# Patient Record
Sex: Male | Born: 1940
Health system: Southern US, Community
[De-identification: ages and names within clinical notes are randomized; demographics above are authoritative.]

## PROBLEM LIST (undated history)

## (undated) DIAGNOSIS — K219 Gastro-esophageal reflux disease without esophagitis: Secondary | ICD-10-CM

## (undated) DIAGNOSIS — D692 Other nonthrombocytopenic purpura: Secondary | ICD-10-CM

## (undated) DIAGNOSIS — I10 Essential (primary) hypertension: Secondary | ICD-10-CM

## (undated) DIAGNOSIS — E039 Hypothyroidism, unspecified: Secondary | ICD-10-CM

## (undated) DIAGNOSIS — E7800 Pure hypercholesterolemia, unspecified: Secondary | ICD-10-CM

## (undated) DIAGNOSIS — D352 Benign neoplasm of pituitary gland: Secondary | ICD-10-CM

## (undated) DIAGNOSIS — J449 Chronic obstructive pulmonary disease, unspecified: Secondary | ICD-10-CM

## (undated) DIAGNOSIS — I2510 Atherosclerotic heart disease of native coronary artery without angina pectoris: Secondary | ICD-10-CM

## (undated) DIAGNOSIS — K5900 Constipation, unspecified: Secondary | ICD-10-CM

## (undated) DIAGNOSIS — M1990 Unspecified osteoarthritis, unspecified site: Secondary | ICD-10-CM

## (undated) DIAGNOSIS — Z8659 Personal history of other mental and behavioral disorders: Secondary | ICD-10-CM

## (undated) DIAGNOSIS — T7840XA Allergy, unspecified, initial encounter: Secondary | ICD-10-CM

## (undated) DIAGNOSIS — K5790 Diverticulosis of intestine, part unspecified, without perforation or abscess without bleeding: Secondary | ICD-10-CM

## (undated) DIAGNOSIS — F32A Depression, unspecified: Secondary | ICD-10-CM

## (undated) DIAGNOSIS — E079 Disorder of thyroid, unspecified: Secondary | ICD-10-CM

## (undated) DIAGNOSIS — K635 Polyp of colon: Secondary | ICD-10-CM

## (undated) DIAGNOSIS — F419 Anxiety disorder, unspecified: Secondary | ICD-10-CM

## (undated) DIAGNOSIS — C61 Malignant neoplasm of prostate: Secondary | ICD-10-CM

## (undated) HISTORY — DX: Anxiety disorder, unspecified: F41.9

## (undated) HISTORY — PX: PROSTATE SURGERY: SHX751

## (undated) HISTORY — DX: Malignant neoplasm of prostate: C61

## (undated) HISTORY — DX: Essential (primary) hypertension: I10

## (undated) HISTORY — DX: Unspecified osteoarthritis, unspecified site: M19.90

## (undated) HISTORY — DX: Constipation, unspecified: K59.00

## (undated) HISTORY — DX: Polyp of colon: K63.5

## (undated) HISTORY — DX: Allergy, unspecified, initial encounter: T78.40XA

## (undated) HISTORY — PX: POLYPECTOMY: SHX149

## (undated) HISTORY — DX: Hypothyroidism, unspecified: E03.9

## (undated) HISTORY — DX: Gastro-esophageal reflux disease without esophagitis: K21.9

## (undated) HISTORY — DX: Personal history of other mental and behavioral disorders: Z86.59

## (undated) HISTORY — DX: Pure hypercholesterolemia, unspecified: E78.00

## (undated) HISTORY — DX: Atherosclerotic heart disease of native coronary artery without angina pectoris: I25.10

## (undated) HISTORY — DX: Diverticulosis of intestine, part unspecified, without perforation or abscess without bleeding: K57.90

## (undated) HISTORY — DX: Benign neoplasm of pituitary gland: D35.2

## (undated) HISTORY — DX: Depression, unspecified: F32.A

## (undated) HISTORY — PX: UPPER GASTROINTESTINAL ENDOSCOPY: SHX188

## (undated) HISTORY — DX: Disorder of thyroid, unspecified: E07.9

## (undated) HISTORY — PX: COLONOSCOPY: SHX174

## (undated) HISTORY — DX: Chronic obstructive pulmonary disease, unspecified: J44.9

---
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
## 1989-05-10 HISTORY — PX: BACK SURGERY: SHX140

## 2003-07-03 ENCOUNTER — Encounter: Payer: Self-pay | Admitting: Internal Medicine

## 2003-07-03 DIAGNOSIS — D126 Benign neoplasm of colon, unspecified: Secondary | ICD-10-CM | POA: Insufficient documentation

## 2003-07-03 DIAGNOSIS — K5730 Diverticulosis of large intestine without perforation or abscess without bleeding: Secondary | ICD-10-CM | POA: Insufficient documentation

## 2003-08-28 ENCOUNTER — Ambulatory Visit (HOSPITAL_COMMUNITY): Admission: RE | Admit: 2003-08-28 | Discharge: 2003-08-28 | Payer: Self-pay | Admitting: Family Medicine

## 2003-09-20 ENCOUNTER — Ambulatory Visit (HOSPITAL_COMMUNITY): Admission: RE | Admit: 2003-09-20 | Discharge: 2003-09-20 | Payer: Self-pay | Admitting: Orthopedic Surgery

## 2004-04-06 ENCOUNTER — Ambulatory Visit (HOSPITAL_COMMUNITY): Admission: RE | Admit: 2004-04-06 | Discharge: 2004-04-06 | Payer: Self-pay | Admitting: Family Medicine

## 2004-05-28 ENCOUNTER — Ambulatory Visit (HOSPITAL_COMMUNITY): Admission: RE | Admit: 2004-05-28 | Discharge: 2004-05-28 | Payer: Self-pay | Admitting: Family Medicine

## 2005-06-16 ENCOUNTER — Ambulatory Visit: Payer: Self-pay | Admitting: *Deleted

## 2005-06-18 ENCOUNTER — Ambulatory Visit: Payer: Self-pay | Admitting: Cardiology

## 2005-06-18 ENCOUNTER — Ambulatory Visit (HOSPITAL_COMMUNITY): Admission: RE | Admit: 2005-06-18 | Discharge: 2005-06-18 | Payer: Self-pay | Admitting: *Deleted

## 2005-06-23 ENCOUNTER — Ambulatory Visit: Payer: Self-pay | Admitting: *Deleted

## 2007-05-20 ENCOUNTER — Emergency Department (HOSPITAL_COMMUNITY): Admission: EM | Admit: 2007-05-20 | Discharge: 2007-05-20 | Payer: Self-pay | Admitting: Emergency Medicine

## 2007-06-15 ENCOUNTER — Ambulatory Visit (HOSPITAL_COMMUNITY): Admission: RE | Admit: 2007-06-15 | Discharge: 2007-06-15 | Payer: Self-pay | Admitting: Family Medicine

## 2007-08-07 ENCOUNTER — Encounter (INDEPENDENT_AMBULATORY_CARE_PROVIDER_SITE_OTHER): Payer: Self-pay | Admitting: Surgery

## 2007-08-07 ENCOUNTER — Ambulatory Visit (HOSPITAL_COMMUNITY): Admission: RE | Admit: 2007-08-07 | Discharge: 2007-08-07 | Payer: Self-pay | Admitting: Surgery

## 2007-08-09 HISTORY — PX: CHOLECYSTECTOMY: SHX55

## 2007-10-13 ENCOUNTER — Ambulatory Visit (HOSPITAL_COMMUNITY): Admission: RE | Admit: 2007-10-13 | Discharge: 2007-10-13 | Payer: Self-pay | Admitting: Family Medicine

## 2007-11-22 ENCOUNTER — Encounter: Payer: Self-pay | Admitting: Internal Medicine

## 2007-12-08 ENCOUNTER — Ambulatory Visit: Payer: Self-pay | Admitting: Cardiology

## 2008-01-09 HISTORY — PX: OTHER SURGICAL HISTORY: SHX169

## 2008-01-25 ENCOUNTER — Encounter (INDEPENDENT_AMBULATORY_CARE_PROVIDER_SITE_OTHER): Payer: Self-pay | Admitting: Neurosurgery

## 2008-01-25 ENCOUNTER — Inpatient Hospital Stay (HOSPITAL_COMMUNITY): Admission: RE | Admit: 2008-01-25 | Discharge: 2008-01-28 | Payer: Self-pay | Admitting: Neurosurgery

## 2008-03-06 ENCOUNTER — Ambulatory Visit (HOSPITAL_COMMUNITY): Admission: RE | Admit: 2008-03-06 | Discharge: 2008-03-06 | Payer: Self-pay | Admitting: Neurosurgery

## 2008-03-10 ENCOUNTER — Inpatient Hospital Stay (HOSPITAL_COMMUNITY): Admission: EM | Admit: 2008-03-10 | Discharge: 2008-03-11 | Payer: Self-pay | Admitting: Emergency Medicine

## 2008-03-10 ENCOUNTER — Ambulatory Visit: Payer: Self-pay | Admitting: Cardiology

## 2008-06-27 DIAGNOSIS — E7800 Pure hypercholesterolemia, unspecified: Secondary | ICD-10-CM

## 2008-06-27 DIAGNOSIS — D353 Benign neoplasm of craniopharyngeal duct: Secondary | ICD-10-CM

## 2008-06-27 DIAGNOSIS — D352 Benign neoplasm of pituitary gland: Secondary | ICD-10-CM

## 2008-06-27 DIAGNOSIS — K219 Gastro-esophageal reflux disease without esophagitis: Secondary | ICD-10-CM

## 2008-06-27 DIAGNOSIS — I10 Essential (primary) hypertension: Secondary | ICD-10-CM

## 2008-07-01 ENCOUNTER — Ambulatory Visit: Payer: Self-pay | Admitting: Internal Medicine

## 2008-07-01 DIAGNOSIS — K5900 Constipation, unspecified: Secondary | ICD-10-CM | POA: Insufficient documentation

## 2008-07-01 DIAGNOSIS — Z860100 Personal history of colon polyps, unspecified: Secondary | ICD-10-CM | POA: Insufficient documentation

## 2008-07-01 DIAGNOSIS — K625 Hemorrhage of anus and rectum: Secondary | ICD-10-CM | POA: Insufficient documentation

## 2008-07-10 ENCOUNTER — Ambulatory Visit: Payer: Self-pay | Admitting: Internal Medicine

## 2008-12-11 ENCOUNTER — Ambulatory Visit (HOSPITAL_COMMUNITY): Admission: RE | Admit: 2008-12-11 | Discharge: 2008-12-11 | Payer: Self-pay | Admitting: Neurosurgery

## 2009-01-30 ENCOUNTER — Ambulatory Visit: Admission: RE | Admit: 2009-01-30 | Discharge: 2009-03-22 | Payer: Self-pay | Admitting: Radiation Oncology

## 2009-02-03 LAB — PSA: PSA: 6.76 ng/mL — ABNORMAL HIGH (ref 0.10–4.00)

## 2009-04-18 ENCOUNTER — Telehealth (INDEPENDENT_AMBULATORY_CARE_PROVIDER_SITE_OTHER): Payer: Self-pay | Admitting: *Deleted

## 2009-04-21 ENCOUNTER — Inpatient Hospital Stay (HOSPITAL_COMMUNITY): Admission: RE | Admit: 2009-04-21 | Discharge: 2009-04-23 | Payer: Self-pay | Admitting: Urology

## 2009-04-21 ENCOUNTER — Encounter (INDEPENDENT_AMBULATORY_CARE_PROVIDER_SITE_OTHER): Payer: Self-pay | Admitting: Urology

## 2009-12-08 ENCOUNTER — Ambulatory Visit (HOSPITAL_COMMUNITY): Admission: RE | Admit: 2009-12-08 | Discharge: 2009-12-08 | Payer: Self-pay | Admitting: Family Medicine

## 2010-05-31 ENCOUNTER — Encounter: Payer: Self-pay | Admitting: Family Medicine

## 2010-08-11 LAB — TYPE AND SCREEN
ABO/RH(D): A POS
Antibody Screen: NEGATIVE

## 2010-08-11 LAB — HEMOGLOBIN AND HEMATOCRIT, BLOOD
HCT: 37.7 % — ABNORMAL LOW (ref 39.0–52.0)
HCT: 38.6 % — ABNORMAL LOW (ref 39.0–52.0)
Hemoglobin: 12.7 g/dL — ABNORMAL LOW (ref 13.0–17.0)
Hemoglobin: 13 g/dL (ref 13.0–17.0)

## 2010-08-11 LAB — COMPREHENSIVE METABOLIC PANEL
ALT: 21 U/L (ref 0–53)
AST: 20 U/L (ref 0–37)
Albumin: 3.9 g/dL (ref 3.5–5.2)
Alkaline Phosphatase: 76 U/L (ref 39–117)
BUN: 9 mg/dL (ref 6–23)
CO2: 30 mEq/L (ref 19–32)
Calcium: 9.6 mg/dL (ref 8.4–10.5)
Chloride: 105 mEq/L (ref 96–112)
Creatinine, Ser: 1.05 mg/dL (ref 0.4–1.5)
GFR calc Af Amer: >60 mL/min (ref >60)
GFR calc non Af Amer: >60 mL/min (ref >60)
Glucose, Bld: 95 mg/dL (ref 70–99)
Potassium: 4.8 mEq/L (ref 3.5–5.1)
Sodium: 142 mEq/L (ref 135–145)
Total Bilirubin: 0.7 mg/dL (ref 0.3–1.2)
Total Protein: 7.5 g/dL (ref 6.0–8.3)

## 2010-08-11 LAB — CBC
HCT: 45.4 % (ref 39.0–52.0)
Hemoglobin: 15.3 g/dL (ref 13.0–17.0)
MCHC: 33.6 g/dL (ref 30.0–36.0)
MCV: 93.5 fL (ref 78.0–100.0)
Platelets: 195 10*3/uL (ref 150–400)
RBC: 4.86 MIL/uL (ref 4.22–5.81)
RDW: 12.6 % (ref 11.5–15.5)
WBC: 7.4 10*3/uL (ref 4.0–10.5)

## 2010-08-11 LAB — BASIC METABOLIC PANEL
BUN: 11 mg/dL (ref 6–23)
CO2: 25 mEq/L (ref 19–32)
Chloride: 100 mEq/L (ref 96–112)
Creatinine, Ser: 1.19 mg/dL (ref 0.4–1.5)
Potassium: 4.4 mEq/L (ref 3.5–5.1)

## 2010-08-11 LAB — ABO/RH: ABO/RH(D): A POS

## 2010-08-15 LAB — CREATININE, SERUM
Creatinine, Ser: 1.06 mg/dL (ref 0.4–1.5)
GFR calc Af Amer: >60 mL/min (ref >60)

## 2010-09-22 NOTE — Consult Note (Signed)
[name], [name] NO.:  [id number]   MEDICAL RECORD NO.:  [id number]          PATIENT TYPE:  INP   LOCATION:  A311                          FACILITY:  [facility]   PHYSICIAN:  Gerrit Friends. Dietrich Pates, MD, FACCDATE OF BIRTH:  [birth year]   DATE OF CONSULTATION:  DATE OF DISCHARGE:  03/11/2008                                 CONSULTATION   REFERRING PHYSICIAN:  Scott A. Gerda Diss, MD   HISTORY OF PRESENT ILLNESS:  A 70 year old gentleman with a recent  transsphenoidal resection of a pituitary adenoma now presents with chest  discomfort.  Mr. Loera has no history of cardiovascular disease, but he  does have a number of risk factors including hypertension and  hyperlipidemia.  He smokes cigarettes when he was much younger, but  discontinued this when he was 32 with a total consumption of less than  20-pack-years.  He was evaluated by Dr. Shirlee Latch of [facility] in  July, prior to his planned neurosurgery.  He was not found to have any  symptoms of cardiovascular disease at that time.  No further testing was  advised.  He did well, from a physiologic standpoint, with that surgical  procedure well as cholecystectomy earlier this year.   He has had problems since his pituitary surgery with malaise, exercise  intolerance, and dyspnea.  The latter is not true air hunger, but a  sense of difficulty taking a breath.  This is not necessarily related to  exertion.  She was evaluated by an endocrinologist, Dr. Lurene Shadow, a week  or two ago and told that he has no endocrinologic abnormalities.  He was  relaxing at home when he noted the onset of moderate lower mid  substernal aching discomfort without associated nausea or dyspnea.  He  did have diaphoresis.  He has had some symptoms of GERD, which were  similar, but without the diaphoresis.  He reported this to a family  member who summoned EMS.  He was subsequently seen at the emergency  department and admitted.  By the time he  arrived, his symptoms had  resolved.   Past medical history is, otherwise, notable for diverticular disease and  BPH.  He underwent laparoscopic cholecystectomy in April of this year  and remote lumbosacral spine surgery.   He has no known drug allergies.  He has had adverse effects to  narcotics, specifically CODEINE and MORPHINE.   CURRENT MEDICATIONS:  1. Lisinopril 5 mg daily.  2. Pravastatin 40 mg daily.  3. Metoprolol - dose uncertain.  4. He has taken omeprazole in the past.   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  Drinks  2-4 ounces of alcohol a day.  Lives with his wife.   FAMILY HISTORY:  Father had coronary artery disease and a myocardial  infarction at age 70.   REVIEW OF SYSTEMS:  Mild intermittent dizziness; some arthritic  discomfort; hemorrhoids; follows a low-fat diet at home.  Experiences  some nocturia and other urinary symptoms of BPH.  Requires  corrective  lenses for near and far vision.  Has some lower dentures.  All other  systems reviewed and are negative.   PHYSICAL EXAMINATION:  GENERAL:  Pleasant, well-nourished gentleman in  no acute distress.  VITAL SIGNS:  The weight is 97.5 kg, height is 67 inches, temperature  98.6, heart rate 95 and regular, respirations 20, blood pressure 140/80.  O2 sat 95% on room air.  HEENT:  EOMs full; slightly injected conjunctivae.  Normal oral mucosa.  NECK:  No jugular venous distention; normal carotid upstrokes without  bruits.  LUNGS:  Clear.  CARDIAC:  Normal first and second heart sounds.  Minimal systolic  ejection murmur.  ABDOMEN:  Soft and nontender; no organomegaly; normal bowel sounds  without bruits.  EXTREMITIES:  No edema; normal distal pulses.  NEUROLOGIC:  Symmetric strength and tone; normal cranial nerves.   Laboratory is unremarkable with a normal chemistry profile except for a  glucose of 139,  normal CBC, slightly elevated D-dimer of 0.7, normal  cardiac markers, and a normal urinalysis.  CT scan of the chest showed  no significant lung abnormalities, but there were vascular calcification  suggestive of atherosclerosis in the aorta and coronary arteries.  EKGs  are normal except for the presence of nondiagnostic inferior Q-waves.   IMPRESSION:  Mr. [name] presents with a single episode of chest  discomfort that may well have been caused by his known gastroesophageal  reflux disease.  His exam, cardiac markers, and EKGs are benign, but CT  scan of the chest indicates the presence of coronary atherosclerosis.  I  would certainly proceed with a stress test, but this can be done on an  outpatient basis.  He probably should resume omeprazole.  The etiology  of his nonspecific weakness and malaise as well as his respiratory  symptoms is not immediately obvious.  He should be encouraged to be as  active as possible.  If symptoms persist, additional testing may be  warranted.   We greatly appreciate the request for consultation and will be happy to  continue to provide Cardiology Care to this nice gentleman.      Gerrit Friends. Dietrich Pates, MD, [facility]  Electronically Signed     RMR/MEDQ  D:  03/11/2008  T:  03/12/2008  Job:  [id number]

## 2010-09-22 NOTE — H&P (Signed)
NAMEBLESS, BELSHE NO.:  [id number]   MEDICAL RECORD NO.:  [id number]          PATIENT TYPE:  INP   LOCATION:  A311                          FACILITY:  [facility]   PHYSICIAN:  Dorris Singh, DO    DATE OF BIRTH:  [birth year]   DATE OF ADMISSION:  03/10/2008  DATE OF DISCHARGE:  LH                              HISTORY & PHYSICAL   The patient is a 70 year old Caucasian male who presented to [facility]  emergency room with an insidious report of weakness that had been going  on for the last several days.  He is a patient of Dr. Gerda Diss. Just  recently on September 17 he had the removal of a pituitary tumor, and  since then he has noticed since several weeks that he has had weakness  with minimal exertion.  He is seeing Dr. Talmage Nap in [place], and has  had labs which are normal.  Currently he has been drinking lots of  water, urinating frequently.  He is not a diabetic, but has been  concerned about this as well and is also complaining of some extremity  numbness and tingling.  The patient stated that tonight he experienced  some chest discomfort which was located in the mid epigastric area.  It  was also associated with shortness of breath, and he has not been  sleeping for several days.  He just taking Ambien over the weekend.  He  took it on Friday, did well.  Started taking it again on Saturday; one 5  mg dose did not help him.  He had repeated the dose and ended up having  an amnesic event where he woke up and found urine all over the floor.  At this point in time with this weakness, he decided to come and be  evaluated.   PAST MEDICAL HISTORY:  Significant for:   1. Hypertension.  2. Diverticulosis.  3. Prostatic hypertrophy.  4. A pituitary tumor.   SURGICAL HISTORY:  Significant for back surgery and a pituitary tumor  removal January 25, 2008.   SOCIAL HISTORY:  He is a nonsmoker, but he is a former smoker and heavy  drinker.  He lives alone and has  pets.   ALLERGIES:  He has allergies to CODEINE which causes nausea and vomiting  and to MORPHINE which caused chest pain.   Currently he is on:   1. Lisinopril 5 mg once a day.  2. Pravachol no dose.  3. Metoprolol tartrate no dose.  4. Benadryl oral no dose.  5. Dexamethasone after his surgery.   REVIEW OF SYSTEMS:  CONSTITUTIONAL:  Positive for fatigue, negative for  fever and chills.  EYES:  Negative for eye pain.  EARS, NOSE, MOUTH, AND  THROAT:  Negative for ear pain, changes in smell, changes in hearing.  CARDIOVASCULAR:  Positive for chest pain.  RESPIRATORY:  Negative for  cough, dyspnea or wheezing.  GASTROINTESTINAL:  Negative for nausea,  vomiting or diarrhea, abdominal pain.  GU:  Positive for polyuria.  MUSCULOSKELETAL:  Negative for arthralgias.  Positive for back pain.  SKIN:  Negative for rash.  NEUROLOGICAL:  Positive for weakness.  Negative for headache.  PSYCHIATRIC:  Negative for depression.  HEMATOLOGIC:  Negative for anemia.   VITALS:  Temperature 98.6, pulse 93, respirations 20, blood pressure  139/79.  GENERAL:  The patient is a 70 year old Caucasian male who is well-  developed, well-nourished in no acute distress.  HEAD:  Normocephalic, atraumatic.  EYES:  PERRLA, EOMI.  No scleral icterus or conjunctival injection.  MOUTH:  Teeth are in good repair.  There is no erythema or exudate.  NECK:  Supple.  No lymphadenopathy.  HEART:  Regular rate and rhythm.  LUNGS:  Clear to auscultation bilaterally.  No rales, wheezes or  rhonchi.  ABDOMEN:  Soft, nontender.  EXTREMITIES:  Positive pulses.   LABORATORIES FOR TODAY:  135 for sodium, potassium 3.9, chloride 101,  CO2 27, glucose 115, BUN 15, creatinine 1.12.  His urine is negative.   ASSESSMENT/PLAN:  1. Tachycardia.  2. Weakness.  3. History of pituitary tumor.   PLAN:  To admit patient to the service of [facility] and then to transfer  his care over to Dr. Gerda Diss.   1. Will admit him to a  telemetry bed.  Will also give him IV fluid      boluses.  2. Keep him on IV fluids.  3. Will do DVT and GI prophylaxis, and place the patient on home      medication.  4. Will consult [facility] as well as obtaining a 2-D echo.      Will continue to monitor and make changes as necessary.      Dorris Singh, DO  Electronically Signed     CB/MEDQ  D:  03/11/2008  T:  03/11/2008  Job:  [id number]   cc:   Gerda Diss, M.D.

## 2010-09-22 NOTE — Assessment & Plan Note (Signed)
[facility]                            CARDIOLOGY OFFICE NOTE   [name], [name]                         MRN:          [id number]  DATE:12/08/2007                            DOB:          Nov 03, 1940    REFERRING PHYSICIAN:  Cristi Loron, M.D.   HISTORY OF PRESENT ILLNESS:  This is a 70 year old man with a history of  hypertension, hypercholesterolemia, presents to Cardiology Clinic for  evaluation prior to pituitary surgery.  The patient has had a history of  last few months of headaches and increasing fatigue.  He did have an  evaluation by Neurology who was found to have a pituitary macroadenoma,  which appears to be growth hormone secreting.  He does need surgery for  this and was referred for cardiac evaluation prior to surgery.  In  baseline since the diagnosis of this tumor was made, the patient states  that he has been more fatigued.  However, he does have good exercise  tolerance.  He is able to climb several flights of steps without  trouble.  He is very active.  He does his yard work and recently cleaned  his gutters and has been working on a [employer], so he does do  fairly strenuous activities without problems.  He denies dyspnea on  exertion.  He denies chest pain.  He denies orthopnea.  His only  complaint has been some recent fatigue that is associated also with  recent onset of headaches as well as some trouble with his balance  lately.  Also of note, the patient recently went through a laparoscopic  cholecystectomy in April 2009, with no problems.   PAST MEDICAL HISTORY:  1. Hypertension.  2. Hypercholesterolemia.  3. Gastroesophageal reflux disease.  4. Pituitary macroadenoma that is growth hormone secreting.  5. Status post laparoscopic cholecystectomy in April 2009.   MEDICATIONS:  1. Lisinopril 5 mg daily.  2. Pravastatin 40 mg daily.  3. Omeprazole 20 mg daily.   SOCIAL HISTORY:  The patient is retired.  He  does do some farming.  He  is a passive smoker.  He quit in 1985.  He drinks 2-4 alcohol beverages  a day.  Lives with his wife.   FAMILY HISTORY:  He had a father who had a myocardial infarction at the  age of 13.   REVIEW OF SYSTEMS:  Negative except as noted in the history of present  illness.   STUDIES:  EKG is reviewed today showing normal sinus rhythm.  No ST or T-  wave changes, hence a normal EKG.  In February 2007, echocardiogram  showed mild LVH, normal LV function, no wall motion abnormalities,  normal RV size and function, and normal valve function.  Carotid  ultrasound in February 2007 showed no significant stenosis.   LABORATORY DATA:  Labs in March 2009, showed a creatinine of 1.0.   PHYSICAL EXAMINATION:  VITAL SIGNS:  Blood pressure 131/70, heart rate  64, and weight 151.  Heart rate is regular.  GENERAL:  In no apparent distress, well-developed male.  NEUROLOGIC:  Alert  and oriented x3, normal affect.  NECK:  No JVD.  No carotid bruits.  LUNGS:  Clear to auscultation bilaterally.  HEART:  Regular S1 and S2.  There is no S3.  No S4.  No murmur.  ABDOMEN:  Soft and nontender.  No hepatosplenomegaly.  EXTREMITIES:  There is no clubbing, cyanosis, or edema.   ASSESSMENT AND PLAN:  This is a 70 year old with history of  hypertension, hyperlipidemia, presents to Cardiology Clinic for  evaluation prior to pituitary surgery.  1. Preoperative pituitary surgery.  The patient has good exercise      tolerance.  He has no symptoms that are referable to ischemia.  He      does have some risk factors for coronary artery disease including      hypertension, hypercholesterolemia, and his age.  There are no      findings that would grab [place] to do a preoperative stress test at      this point.  Also of note, he did safely go through a laparoscopic      cholecystectomy in April.  I think at this time, the patient needs      no further testing prior to his surgery.  He should  continue on his      statin.  We will add just baseline primary prevention, an 81 mg a      day aspirin, which will need to be stopped prior to the surgery      when that is scheduled, and also we will have him start on Toprol-      XL 25 mg a day as he will need to be on a perioperative beta-      blocker.  2. Hypertension.  The patient's blood pressure is controlled today at      131/70.  We are starting Toprol-XL today as a perioperative beta-      blockade.   This patient was seen under preceptorship of Dr. Everardo Beals. Juanda Chance, MD,  [facility].     Marca Ancona, MD  Electronically Signed    DM/MedQ  DD: 12/08/2007  DT: 12/09/2007  Job #: [id number]   cc:   Cristi Loron, M.D.  Dorisann Frames, M.D.  Bruce Elvera Lennox Juanda Chance, MD, [facility]

## 2010-09-22 NOTE — Discharge Summary (Signed)
NAMEJACQUEL, MCCAMISH NO.:  [id number]   MEDICAL RECORD NO.:  [id number]          PATIENT TYPE:  INP   LOCATION:  3002                         FACILITY:  [facility]   PHYSICIAN:  Cristi Loron, M.D.DATE OF BIRTH:  Feb 12, 1941   DATE OF ADMISSION:  01/25/2008  DATE OF DISCHARGE:  01/28/2008                               DISCHARGE SUMMARY   BRIEF HISTORY:  The patient is a 70 year old white male who was found to  have a pituitary tumor.  This was followed radiographically and has been  found that the tumor was enlarging.  We discussed the various treatment  options with the patient including surgery.  He has to weigh the risks,  benefits, and alternatives surgeries, I proceed with a transsphenoidal  resection of this tumor.  For further details of this admission, please  refer typed history and physical.   HOSPITAL COURSE:  The patient was admitted to [facility] on January 25, 2008.  On the day of admission, he underwent transsphenoidal  resection of pituitary tumor using microdissection.  Dr. Dillard Cannon did the exposure and I did the evacuation of the tumor.  The  surgery went well (for full details of this operation, please refer to  typed operative notes).   POSTOPERATIVE COURSE:  The patient's postoperative course is essentially  unremarkable.  He was monitored in the ICU for signs of diabetes  insipidus.  This did not occur and by postop day #3, i.e., January 28, 2008, the patient was afebrile.  Vitals were stable.  Hislungs looked  good and he was good and he was discharged to home.  Dr. Ezzard Standing removed  this nasal packing.  The patient was discharged home on January 28, 2008.   DISCHARGE INSTRUCTIONS:  The patient was given written discharge  instructions.  He was instructed to follow up with me in approximately 4  weeks.  Follow up with Dr. Ezzard Standing per his instructions and told to  follow up with Dr. Lurene Shadow (Endocrinologist) in about 4  weeks.   DISCHARGE MEDICATIONS:  Lortab 10 #50 one p.o. q.4 h. p.r.n. for pain  and a Decadron taper.   FINAL DIAGNOSIS:  Pituitary microadenoma.   PROCEDURE PERFORMED:  Transsphenoidal resection of pituitary  microadenoma using microdissection.      Cristi Loron, M.D.  Electronically Signed     JDJ/MEDQ  D:  03/11/2008  T:  03/12/2008  Job:  [id number]

## 2010-09-22 NOTE — Op Note (Signed)
[name], [name] NO.:  [id number]   MEDICAL RECORD NO.:  [id number]          PATIENT TYPE:  INP   LOCATION:  3109                         FACILITY:  [facility]   PHYSICIAN:  Kristine Garbe. Ezzard Standing, M.D.DATE OF BIRTH:  Feb 22, 1941   DATE OF PROCEDURE:  DATE OF DISCHARGE:                               OPERATIVE REPORT   PREOPERATIVE DIAGNOSIS:  Pituitary macroadenoma.   POSTOPERATIVE DIAGNOSIS:  Pituitary macroadenoma.   OPERATION:  Septal transsphenoidal resection of pituitary adenoma.   SURGEON:  Kristine Garbe. Ezzard Standing, MD   CO-SURGEON:  Cristi Loron, MD   ANESTHESIA:  General endotracheal.   COMPLICATIONS:  None.   BRIEF CLINICAL NOTE:  [name] is a 70 year old gentleman who had a  blackout spell and had a workup with the CT scan and noted to have a  pituitary tumor.  He is taken to the operating room at this time by Dr.  Lovell Sheehan for resection of pituitary tumor.  I am involved as a co-surgeon  to provide septal transsphenoidal access to pituitary macroadenoma.   DESCRIPTION OF PROCEDURE:  The patient was positioned in the operating  room by Dr. Lovell Sheehan.  The nose was then prepped with Betadine solution  and draped out with sterile towels.  Nose was then further prepped with  cotton pledgets soaked in Afrin and the septum.  The nose was injected  with 8 mL of Xylocaine with epinephrine for local anesthetic as well as  hemostasis.  An extended hemitransfixion incision was made along the  caudal edge of the septum on the right side.  Mucoperiosteal and  mucoperichondrial flaps were elevated posteriorly on the right side.  The cartilaginous septum was then transected vertically approximately 2  cm posteriorly and the anterior portion of the cartilaginous septum was  dissected off of the maxillary crest and displaced over into the left  airway.  Mucoperiosteal flaps were elevated on either side of the  posterior septum back to the space of the sphenoid  sinus.  The Hardy  retractor was then positioned.  A sphenoidotomy was performed and the  sphenoid sinus was entered.  The patient had a septal division of the  sphenoid sinus that anchored anteriorly from left to right.  The septum  where the sphenoid sinus was taken down with rongeurs and the bulge of  the sella was identified.  After enlarging the sphenoidotomy, Dr.  Lovell Sheehan was then called back and we proceeded to resect the pituitary  adenoma.  Following completion of resection, there was no significant  CSF leak and no packing was placed within the sphenoid sinus.  Septum  was brought back to midline by myself and the closure was performed by  myself.  The hemitransfixion incision was closed with interrupted 4-0  chromic suture.  The septum was basted with a 3-0 chromic suture.  Septum originally had been deviated to the left side, after  repositioning the septum in midline and closed with chromic sutures.  Merocel packs were placed on either inside of the septum and soaked in  bacitracin ointment.  This completed the procedure.  [name] was  awakened from anesthesia and transferred to the recovery room, postop  doing well.   DISPOSITION:  [name] will be admitted to the NICU.  We will plan on  removing his nasal packs in 3 days and have him follow up in my office  in 2 weeks for recheck.           ______________________________  Kristine Garbe. Ezzard Standing, M.D.     CEN/MEDQ  D:  01/25/2008  T:  01/25/2008  Job:  [id number]   cc:   Lorin Picket A. Gerda Diss, MD  Cristi Loron, M.D.

## 2010-09-22 NOTE — Op Note (Signed)
NAMETRITON, HEIDRICH NO.:  [id number]   MEDICAL RECORD NO.:  [id number]          PATIENT TYPE:  INP   LOCATION:  3109                         FACILITY:  [facility]   PHYSICIAN:  Cristi Loron, M.D.DATE OF BIRTH:  02/24/1941   DATE OF PROCEDURE:  01/25/2008  DATE OF DISCHARGE:                               OPERATIVE REPORT   BRIEF HISTORY:  This patient is a 70 year old white male who was found  to having a pituitary tumor, this was followed radiographically and has  been found that the tumor is enlarging.  We discussed various treatment  options with the patient including surgery.  He has weighed the risks,  benefits, and alternatives of surgery, and decided to proceed with a  transsphenoidal resection of this tumor.   PREOPERATIVE DIAGNOSIS:  Pituitary tumor/microadenoma.   POSTOPERATIVE DIAGNOSIS:  Pituitary tumor/microadenoma.   PROCEDURE:  Transsphenoidal resection of pituitary tumor using  microdissection.   SURGEONS:  1. Cristi Loron, MD  2. Kristine Garbe. Ezzard Standing, MD   ANESTHESIA:  General endotracheal.   ESTIMATED BLOOD LOSS:  25 mL.   SPECIMENS:  Tumor.   DRAINS:  None.   COMPLICATIONS:  None.   DESCRIPTION OF PROCEDURE:  The patient was brought to the operating room  by an anesthesia team.  General endotracheal anesthesia was induced.  The patient's  calvarium was supported and Mayfield three-point headrest  positioned by both Dr. Ezzard Standing and me.  At this point, Dr. Narda Bonds  provided the exposure surgery.  For his portion of this operation,  please refer today's typed operative note.   After Ezzard Standing had provided exposure, I took over the operation.  Dr.  Ezzard Standing had exposed the sphenoidal sinus and the sella within.  There was  a very thin rim of cortical bone over the sella, this was easily removed  using the pituitary forceps and the Penfield #4.  Then, under the  magnification and illumination of the microscope, incised the  dura over  the sella using the #15 and #11 blade scalpel.  I then used pituitary  forceps and the various ring curettes to carefully remove this pituitary  tumor.  We sent off specimens to the pathologist.  After resection of  the tumor, I obtained hemostasis by using thrombin-soaked Gelfoam with  it to sit in the sella for few minutes and then removed it.  There was  actually no bleeding in the sella.  At this point, we irrigated the  wound out with Bacitracin solution.  I think, we got a gross-total  resection of this tumor.  We left one small piece of Gelfoam in the  sella.  We did not violate the diaphragma sellae and there was no  evidence of CSF leak.  At this point, Dr. Ezzard Standing took over the closure  of the operation.  For further details, please refer his operative note.   At the end of this case, all sponge, instruments, and needle counts were  correct.  The patient was subsequently extubated by anesthesia team and  transported to the postanesthesia care unit in stable  condition.      Cristi Loron, M.D.  Electronically Signed     JDJ/MEDQ  D:  01/25/2008  T:  01/26/2008  Job:  [id number]

## 2010-09-22 NOTE — Assessment & Plan Note (Signed)
[facility]                            CARDIOLOGY OFFICE NOTE   [name], [name]                         MRN:          [id number]  DATE:12/08/2007                            DOB:          06-Jun-1940    ADDENDUM   I would like to add that this note needs to be cc'd to Dorisann Frames,  M.D. and Dr. Cristi Loron, MD, and also, of note, this patient was  seen under preceptorship of Dr.Bruce R. Juanda Chance, MD, [facility].     Marca Ancona, MD     DM/MedQ  DD: 12/08/2007  DT: 12/09/2007  Job #: [id number]

## 2010-09-25 NOTE — Procedures (Signed)
NAMEHYDER, DEMAN NO.:  [id number]   MEDICAL RECORD NO.:  [id number]          PATIENT TYPE:  OUT   LOCATION:  RAD                           FACILITY:  [facility]   PHYSICIAN:  [place] Bing, M.D. [facility] OF BIRTH:  1940-11-16   DATE OF PROCEDURE:  06/18/2005  DATE OF DISCHARGE:                                  ECHOCARDIOGRAM   REFERRING:  Drs. Luk[place] and [place]   CLINICAL DATA:  A 70 year old gentleman with hypertension and syncope.   M-MODE:  Aorta 2.6, left atrium 3.8, septum 1.3, posterior wall 1.1, LV  diastole 3.4, LV systole 2.1.   CONCLUSIONS:  1.  Technically adequate echocardiographic study.  2.  Normal left and right atrial size; normal right ventricular size and      function; very mild RVH.  3.  Normal aortic, mitral, tricuspid and pulmonic valves.  4.  Normal Doppler study with trivial tricuspid regurgitation.  5.  Normal IVC.  6.  Normal left ventricular size; borderline hypertrophy with some      disproportionate thickening of the septum. Normal regional and global LV      systolic function.      [place] Bing, M.D. [facility]  Electronically Signed     RR/MEDQ  D:  06/19/2005  T:  06/20/2005  Job:  [id number]

## 2010-09-25 NOTE — Op Note (Signed)
NAMEDANIAL, HLAVAC NO.:  [id number]   MEDICAL RECORD NO.:  [id number]          PATIENT TYPE:  AMB   LOCATION:  DAY                          FACILITY:  [facility]   PHYSICIAN:  Currie Paris, M.D.DATE OF BIRTH:  [birth year]   DATE OF PROCEDURE:  DATE OF DISCHARGE:  08/07/2007                               OPERATIVE REPORT   OFFICE MEDICAL RECORD NUMBER:  [id number]   PREOPERATIVE DIAGNOSIS:  Chronic calculus cholecystitis.   POSTOPERATIVE DIAGNOSIS:  Chronic calculus cholecystitis.   PROCEDURE:  Laparoscopic cholecystectomy with operative cholangiogram.   SURGEON:  Currie Paris, M.D.   ASSISTANT:  Anselm Pancoast. Zachery Dakins, M.D.   ANESTHESIA:  General endotracheal.   CLINICAL HISTORY:  This is a 70 year old gentleman with known gallstones  who wished to have his gallbladder removed .   DESCRIPTION OF PROCEDURE:  The patient was seen in the holding area and  had no further questions.  We confirmed cholecystectomy as the planned  procedure.   The patient was taken to the operating room and after satisfactory  general endotracheal anesthesia had been obtained the abdomen was  prepped and draped.  Time-out was done.   Quarter percent plain Marcaine was used for each incision.  The  umbilical incision was made, the fascia opened and the peritoneal cavity  entered.  A pursestring was placed, the Hasson introduced and the  abdomen insufflated to 15.   Under direct vision a 10/11 trocar was placed in the epigastrium and two  5's laterally.  The gallbladder was visualized.  It was retracted over  the liver.  A few adhesions were taken down.  The peritoneum over the  triangle of Calot was opened and the cystic artery and cystic duct  identified.   A cystic clip was placed on the cystic duct and catheterized with a Cook  catheter and operative cholangiography done which was normal.   The catheter was removed and 2 clips placed on the stay side of the  duct.  It was divided.  The artery was clipped and divided.  Smaller  branches were clipped and the gallbladder removed from below to above.  This was placed in a bag and brought out the umbilical port.   We irrigated and made sure everything was dry.  The lateral ports were  removed under direct vision.  There was no bleeding.  The umbilical site  was closed with a pursestring.  The abdomen was deflated through the  epigastric port.  The skin was closed with 4-0 Monocryl subcuticular and  Dermabond.   The patient tolerated the procedure well and there were no  complications.  All counts were correct.   Please note this is a redictation of the note done the day of surgery  and hence is done to the best of my memory.      Currie Paris, M.D.  Electronically Signed     CJS/MEDQ  D:  08/22/2007  T:  08/22/2007  Job:  [id number]   cc:   Lorin Picket A. Gerda Diss, MD  Fax: [phone]

## 2010-09-25 NOTE — Discharge Summary (Signed)
NAMEAMARIE, TARTE                  ACCOUNT NO.:  [id number]   MEDICAL RECORD NO.:  [id number]          PATIENT TYPE:  INP   LOCATION:  A311                          FACILITY:  [facility]   PHYSICIAN:  Scott A. Gerda Diss, MD    DATE OF BIRTH:  1940/11/10   DATE OF ADMISSION:  03/10/2008  DATE OF DISCHARGE:  11/02/2009LH                               DISCHARGE SUMMARY   DIAGNOSES:  1. Weakness.  2. Excessive thirst and urination.  3. Also some tachycardia that he experienced before coming in to the      hospital.   Recently he had had a pituitary tumor removed.  His enzymes ruled out.  Cardiology consult __________ did not feel that he was having any  significant problem other than possibly reflux, and they were going to  set him up as an outpatient for a stress test.  He was feeling better  after receiving fluids and was felt stable for discharge, and discharged  to home.  Cardiac exam was normal at the time of discharge as well as  neurologic exam.  It was most likely could be developing diabetes  insipidus secondary to his pituitary tumor.  Will refer him back to his  endocrinologist.  He is to do lisinopril 5 mg daily, Pravachol daily,  omeprazole 20 mg daily, and he is recommended to take no Ambien because  it caused amnesia and difficulty during the middle of the night plus  also Xanax on a p.r.n. basis and get the stress test done by cardiology.  Call us if any problems.  Follow up within a week.      Scott A. Gerda Diss, MD  Electronically Signed     SAL/MEDQ  D:  04/01/2008  T:  04/01/2008  Job:  [id number]

## 2011-02-01 LAB — COMPREHENSIVE METABOLIC PANEL
ALT: 29
BUN: 11
Calcium: 9.8
Glucose, Bld: 110 — ABNORMAL HIGH
Sodium: 138
Total Protein: 8.2

## 2011-02-01 LAB — URINALYSIS, ROUTINE W REFLEX MICROSCOPIC
Glucose, UA: NEGATIVE
Specific Gravity, Urine: 1.021
pH: 7

## 2011-02-01 LAB — DIFFERENTIAL
Lymphs Abs: 1.7
Monocytes Relative: 8
Neutro Abs: 4.9
Neutrophils Relative %: 67

## 2011-02-01 LAB — CBC
Hemoglobin: 15.2
MCHC: 33.5
RBC: 4.92
RDW: 12.5
WBC: 7.3

## 2011-02-08 LAB — BASIC METABOLIC PANEL
CO2: 26
Calcium: 8.9
Chloride: 107
Chloride: 108
GFR calc Af Amer: >60
GFR calc Af Amer: >60
GFR calc Af Amer: >60
GFR calc non Af Amer: >60
Glucose, Bld: 128 — ABNORMAL HIGH
Potassium: 4.1
Potassium: 4.2
Sodium: 143
Sodium: 143

## 2011-02-09 LAB — URINALYSIS, ROUTINE W REFLEX MICROSCOPIC
Bilirubin Urine: NEGATIVE
Nitrite: NEGATIVE
Specific Gravity, Urine: <1.005 — ABNORMAL LOW
pH: 6

## 2011-02-09 LAB — BASIC METABOLIC PANEL
BUN: 11
CO2: 24
Calcium: 8.8
Calcium: 8.8
Chloride: 107
Creatinine, Ser: 0.92
GFR calc Af Amer: >60
GFR calc non Af Amer: >60
Glucose, Bld: 115 — ABNORMAL HIGH
Glucose, Bld: 139 — ABNORMAL HIGH
Potassium: 3.9
Sodium: 135

## 2011-02-09 LAB — DIFFERENTIAL
Eosinophils Absolute: 0.1
Eosinophils Relative: 1
Lymphocytes Relative: 23
Lymphs Abs: 1.8
Lymphs Abs: 1.9
Monocytes Absolute: 0.9
Monocytes Absolute: 0.9
Monocytes Relative: 12
Monocytes Relative: 13 — ABNORMAL HIGH
Neutro Abs: 5.2
Neutrophils Relative %: 59

## 2011-02-09 LAB — CK TOTAL AND CKMB (NOT AT ARMC)
CK, MB: 1.7
Relative Index: 1.6

## 2011-02-09 LAB — CBC
HCT: 38 — ABNORMAL LOW
HCT: 39.5
Hemoglobin: 13
Hemoglobin: 13.7
MCV: 93
RBC: 4.09 — ABNORMAL LOW
RBC: 4.25
WBC: 7
WBC: 8.1

## 2011-02-09 LAB — RAPID URINE DRUG SCREEN, HOSP PERFORMED
Amphetamines: NOT DETECTED
Opiates: NOT DETECTED
Tetrahydrocannabinol: NOT DETECTED

## 2011-02-09 LAB — T4: T4, Total: 6.9

## 2011-02-09 LAB — CARDIAC PANEL(CRET KIN+CKTOT+MB+TROPI)
Relative Index: INVALID
Total CK: 71
Troponin I: 0.01

## 2011-02-09 LAB — HEMOGLOBIN A1C: Mean Plasma Glucose: 143

## 2011-02-09 LAB — ETHANOL: Alcohol, Ethyl (B): <5

## 2011-02-09 LAB — GLUCOSE, CAPILLARY: Glucose-Capillary: 99

## 2011-02-09 LAB — T4, FREE: Free T4: 1.01

## 2011-02-09 LAB — TSH: TSH: 5.697 — ABNORMAL HIGH

## 2011-02-10 LAB — BASIC METABOLIC PANEL
BUN: 13
Chloride: 102
Creatinine, Ser: 0.93
Glucose, Bld: 104 — ABNORMAL HIGH
Potassium: 5.3 — ABNORMAL HIGH

## 2011-02-10 LAB — CBC
HCT: 44.7
MCV: 93.4
Platelets: 208
RDW: 12.7

## 2011-07-05 DIAGNOSIS — Z0000 Encounter for general adult medical examination without abnormal findings: Secondary | ICD-10-CM | POA: Diagnosis not present

## 2011-07-07 DIAGNOSIS — L259 Unspecified contact dermatitis, unspecified cause: Secondary | ICD-10-CM | POA: Diagnosis not present

## 2011-07-07 DIAGNOSIS — D235 Other benign neoplasm of skin of trunk: Secondary | ICD-10-CM | POA: Diagnosis not present

## 2011-07-07 DIAGNOSIS — L570 Actinic keratosis: Secondary | ICD-10-CM | POA: Diagnosis not present

## 2011-07-08 DIAGNOSIS — Z79899 Other long term (current) drug therapy: Secondary | ICD-10-CM | POA: Diagnosis not present

## 2011-07-08 DIAGNOSIS — H6120 Impacted cerumen, unspecified ear: Secondary | ICD-10-CM | POA: Diagnosis not present

## 2011-07-08 DIAGNOSIS — E782 Mixed hyperlipidemia: Secondary | ICD-10-CM | POA: Diagnosis not present

## 2011-07-08 DIAGNOSIS — E039 Hypothyroidism, unspecified: Secondary | ICD-10-CM | POA: Diagnosis not present

## 2011-07-09 ENCOUNTER — Other Ambulatory Visit: Payer: Self-pay | Admitting: Family Medicine

## 2011-07-09 ENCOUNTER — Ambulatory Visit (HOSPITAL_COMMUNITY)
Admission: RE | Admit: 2011-07-09 | Discharge: 2011-07-09 | Disposition: A | Discharge status: Home / Self Care | Payer: Medicare Other | Source: Ambulatory Visit | Attending: Family Medicine | Admitting: Family Medicine

## 2011-07-09 DIAGNOSIS — M79609 Pain in unspecified limb: Secondary | ICD-10-CM | POA: Diagnosis not present

## 2011-07-09 DIAGNOSIS — M19049 Primary osteoarthritis, unspecified hand: Secondary | ICD-10-CM | POA: Insufficient documentation

## 2011-07-09 DIAGNOSIS — M79644 Pain in right finger(s): Secondary | ICD-10-CM

## 2011-09-17 DIAGNOSIS — C61 Malignant neoplasm of prostate: Secondary | ICD-10-CM | POA: Diagnosis not present

## 2011-09-24 DIAGNOSIS — C61 Malignant neoplasm of prostate: Secondary | ICD-10-CM | POA: Diagnosis not present

## 2011-09-24 DIAGNOSIS — N529 Male erectile dysfunction, unspecified: Secondary | ICD-10-CM | POA: Diagnosis not present

## 2011-09-24 DIAGNOSIS — E236 Other disorders of pituitary gland: Secondary | ICD-10-CM | POA: Diagnosis not present

## 2011-12-27 ENCOUNTER — Other Ambulatory Visit: Payer: Self-pay | Admitting: Family Medicine

## 2011-12-27 ENCOUNTER — Ambulatory Visit (HOSPITAL_COMMUNITY)
Admission: RE | Admit: 2011-12-27 | Discharge: 2011-12-27 | Disposition: A | Discharge status: Home / Self Care | Payer: Medicare Other | Source: Ambulatory Visit | Attending: Family Medicine | Admitting: Family Medicine

## 2011-12-27 DIAGNOSIS — M25859 Other specified joint disorders, unspecified hip: Secondary | ICD-10-CM | POA: Diagnosis not present

## 2011-12-27 DIAGNOSIS — Z043 Encounter for examination and observation following other accident: Secondary | ICD-10-CM | POA: Diagnosis not present

## 2011-12-27 DIAGNOSIS — T148XXA Other injury of unspecified body region, initial encounter: Secondary | ICD-10-CM

## 2011-12-27 DIAGNOSIS — X58XXXA Exposure to other specified factors, initial encounter: Secondary | ICD-10-CM | POA: Insufficient documentation

## 2011-12-27 DIAGNOSIS — S79919A Unspecified injury of unspecified hip, initial encounter: Secondary | ICD-10-CM | POA: Insufficient documentation

## 2011-12-27 DIAGNOSIS — M25559 Pain in unspecified hip: Secondary | ICD-10-CM | POA: Diagnosis not present

## 2011-12-27 DIAGNOSIS — S40029A Contusion of unspecified upper arm, initial encounter: Secondary | ICD-10-CM | POA: Diagnosis not present

## 2012-01-20 DIAGNOSIS — C61 Malignant neoplasm of prostate: Secondary | ICD-10-CM | POA: Diagnosis not present

## 2012-01-20 DIAGNOSIS — E236 Other disorders of pituitary gland: Secondary | ICD-10-CM | POA: Diagnosis not present

## 2012-01-26 DIAGNOSIS — N529 Male erectile dysfunction, unspecified: Secondary | ICD-10-CM | POA: Diagnosis not present

## 2012-01-26 DIAGNOSIS — C61 Malignant neoplasm of prostate: Secondary | ICD-10-CM | POA: Diagnosis not present

## 2012-01-26 DIAGNOSIS — E236 Other disorders of pituitary gland: Secondary | ICD-10-CM | POA: Diagnosis not present

## 2012-03-07 DIAGNOSIS — E038 Other specified hypothyroidism: Secondary | ICD-10-CM | POA: Diagnosis not present

## 2012-03-07 DIAGNOSIS — E039 Hypothyroidism, unspecified: Secondary | ICD-10-CM | POA: Diagnosis not present

## 2012-03-07 DIAGNOSIS — D443 Neoplasm of uncertain behavior of pituitary gland: Secondary | ICD-10-CM | POA: Diagnosis not present

## 2012-03-07 DIAGNOSIS — E232 Diabetes insipidus: Secondary | ICD-10-CM | POA: Diagnosis not present

## 2012-03-07 DIAGNOSIS — Z23 Encounter for immunization: Secondary | ICD-10-CM | POA: Diagnosis not present

## 2012-03-08 ENCOUNTER — Ambulatory Visit (INDEPENDENT_AMBULATORY_CARE_PROVIDER_SITE_OTHER): Payer: Medicare Other | Admitting: Internal Medicine

## 2012-03-08 ENCOUNTER — Encounter: Payer: Self-pay | Admitting: Internal Medicine

## 2012-03-08 VITALS — BP 120/70 | HR 66 | Ht 66.0 in | Wt 142.8 lb

## 2012-03-08 DIAGNOSIS — K5730 Diverticulosis of large intestine without perforation or abscess without bleeding: Secondary | ICD-10-CM | POA: Diagnosis not present

## 2012-03-08 DIAGNOSIS — K219 Gastro-esophageal reflux disease without esophagitis: Secondary | ICD-10-CM | POA: Diagnosis not present

## 2012-03-08 DIAGNOSIS — A09 Infectious gastroenteritis and colitis, unspecified: Secondary | ICD-10-CM

## 2012-03-08 DIAGNOSIS — R1084 Generalized abdominal pain: Secondary | ICD-10-CM

## 2012-03-08 NOTE — Progress Notes (Signed)
HISTORY OF PRESENT ILLNESS:  [name] is a 71 y.o. male with a history of pituitary tumor status post surgery now on end can replacement therapy, hypertension, hyperlipidemia, GERD, and adenomatous colon polyps. He presents today regarding recent problems with an acute diarrheal illness and abdominal pain. The patient was last seen in March of 2010 when he underwent surveillance colonoscopy (history of adenomatous colon polyps 2005). The examination revealed severe left-sided diverticulosis and stenosis was otherwise negative. Routine followup in 5 years recommended. Patient also has GERD and underwent upper endoscopy in February 2005. Examination was normal except for proximal gastric polyp which was biopsied and found to be benign fundic gland-type. For his GERD he continues on Prilosec. On Prilosec no reflux symptoms. No dysphagia. He was in his usual state of good health until about 2 weeks ago when, while at [facility] [employer], he developed acute and severe diarrhea associated with severe abdominal cramping. He took Imodium with some relief. Diarrhea persisted for about 4 days then resolved. Over the past week he has felt much better with normal appetite and gaining of weight loss with acute on this. There was no rectal bleeding or obvious fevers. Patient had a normal bowel movement this morning. He was concerned that his problems were related to diverticulitis and schedule this appointment.  REVIEW OF SYSTEMS:  All non-GI ROS negative except for sinus and allergy trouble, arthritis, back pain, visual change, confusion, headaches, fatigue, muscle cramps, night sweats, shortness of breath, skin rash, sleeping problems, sore throat, excessive urination, urinary leakage  Past Medical History  Diagnosis Date  . Colon polyps   . Diverticulosis   . Pituitary macroadenoma   . GERD (gastroesophageal reflux disease)   . Hypercholesterolemia   . Hypertension     Past Surgical History  Procedure Date  .  Cholecystectomy 08/2007  . Removal of pituitary tumor 9/09  . Back surgery 1991  . Prostate surgery     Social History [name]  reports that he has quit smoking. He has never used smokeless tobacco. He reports that he does not drink alcohol or use illicit drugs.  family history includes Clotting disorder in his father; Heart attack in his father; and Heart disease in his father.  There is no history of Colon cancer and Diabetes.  Allergies  Allergen Reactions  . Codeine   . Morphine        PHYSICAL EXAMINATION: Vital signs: BP 120/70  Pulse 66  Ht 5\' 6"  (1.676 m)  Wt 142 lb 12.8 oz (64.774 kg)  BMI 23.05 kg/m2 General: Well-developed, well-nourished, no acute distress HEENT: Sclerae are anicteric, conjunctiva pink. Oral mucosa intact Lungs: Clear Heart: Regular Abdomen: soft, nontender, nondistended, no obvious ascites, no peritoneal signs, normal bowel sounds. No organomegaly. Extremities: No edema Psychiatric: alert and oriented x3. Cooperative    ASSESSMENT:  #1. Acute diarrheal illness most likely infectious (viral). Now resolved #2. Diverticulosis without evidence for diverticulitis #3. History of adenomatous colon polyps. Surveillance up-to-date #4. GERD. Negative index endoscopy. No symptoms on PPI   PLAN:  #1. Expected management. #2. Surveillance colonoscopy 2015. Interval followup as needed #3. Continue PPI #4. Ongoing general medical care with PCP.

## 2012-03-08 NOTE — Patient Instructions (Addendum)
Please follow up as needed 

## 2012-06-08 DIAGNOSIS — R5383 Other fatigue: Secondary | ICD-10-CM | POA: Diagnosis not present

## 2012-06-08 DIAGNOSIS — R5381 Other malaise: Secondary | ICD-10-CM | POA: Diagnosis not present

## 2012-06-08 DIAGNOSIS — E782 Mixed hyperlipidemia: Secondary | ICD-10-CM | POA: Diagnosis not present

## 2012-06-08 DIAGNOSIS — Z79899 Other long term (current) drug therapy: Secondary | ICD-10-CM | POA: Diagnosis not present

## 2012-07-07 DIAGNOSIS — Z0000 Encounter for general adult medical examination without abnormal findings: Secondary | ICD-10-CM | POA: Diagnosis not present

## 2012-07-07 DIAGNOSIS — Z1211 Encounter for screening for malignant neoplasm of colon: Secondary | ICD-10-CM | POA: Diagnosis not present

## 2012-07-07 DIAGNOSIS — R7309 Other abnormal glucose: Secondary | ICD-10-CM | POA: Diagnosis not present

## 2012-07-26 DIAGNOSIS — C61 Malignant neoplasm of prostate: Secondary | ICD-10-CM | POA: Diagnosis not present

## 2012-08-02 DIAGNOSIS — N529 Male erectile dysfunction, unspecified: Secondary | ICD-10-CM | POA: Diagnosis not present

## 2012-08-02 DIAGNOSIS — C61 Malignant neoplasm of prostate: Secondary | ICD-10-CM | POA: Diagnosis not present

## 2012-08-02 DIAGNOSIS — E236 Other disorders of pituitary gland: Secondary | ICD-10-CM | POA: Diagnosis not present

## 2012-10-11 ENCOUNTER — Telehealth: Payer: Self-pay | Admitting: Family Medicine

## 2012-10-11 MED ORDER — OMEPRAZOLE 20 MG PO CPDR: 20.0000 mg | DELAYED_RELEASE_CAPSULE | Freq: Every day | ORAL | Status: DC

## 2012-10-11 MED ORDER — LISINOPRIL 5 MG PO TABS: 5.0000 mg | ORAL_TABLET | Freq: Every day | ORAL | Status: DC

## 2012-10-11 NOTE — Telephone Encounter (Signed)
Refills sent electronically to [facility]. Patient notified.

## 2012-10-11 NOTE — Telephone Encounter (Signed)
Patient needs an RX of omeprazole 20 mg RX #[id number] and lisinopril 5 mg YQ#[id number] to [facility]... Phone # [phone]

## 2012-11-03 ENCOUNTER — Ambulatory Visit (INDEPENDENT_AMBULATORY_CARE_PROVIDER_SITE_OTHER): Payer: Medicare Other | Admitting: Nurse Practitioner

## 2012-11-03 ENCOUNTER — Encounter: Payer: Self-pay | Admitting: Nurse Practitioner

## 2012-11-03 VITALS — BP 130/82 | Temp 98.9°F | Wt 146.0 lb

## 2012-11-03 DIAGNOSIS — J069 Acute upper respiratory infection, unspecified: Secondary | ICD-10-CM | POA: Diagnosis not present

## 2012-11-03 MED ORDER — AZITHROMYCIN 250 MG PO TABS: ORAL_TABLET | ORAL | Status: DC

## 2012-11-07 ENCOUNTER — Encounter: Payer: Self-pay | Admitting: Nurse Practitioner

## 2012-11-07 NOTE — Progress Notes (Signed)
Subjective:  Presents complaints of a nonproductive cough for the past 5 days. Worse at night. Postnasal drainage. No fever. Runny nose. No headache. Sore throat began yesterday, this is worse. No ear pain. No vomiting diarrhea abdominal pain or acid reflux. No wheezing.  Objective:   BP 130/82  Temp(Src) 98.9 F (37.2 C) (Oral)  Wt 146 lb (66.225 kg)  BMI 23.58 kg/m2  NAD. Alert, oriented. TMs clear effusion, no erythema. Pharynx injected with slightly green PND noted. Neck supple with mild soft nontender adenopathy. Lungs clear. Heart regular rate rhythm.  Assessment:Acute upper respiratory infection  Plan: Meds ordered this encounter  Medications  . naproxen sodium (ANAPROX) 220 MG tablet    Sig: Take 220 mg by mouth 2 (two) times daily with a meal.  . azithromycin (ZITHROMAX Z-PAK) 250 MG tablet    Sig: Take 2 tablets (500 mg) on  Day 1,  followed by 1 tablet (250 mg) once daily on Days 2 through 5.    Dispense:  6 each    Refill:  0    Order Specific Question:  Supervising Provider    Answer:  Merlyn Albert [[id number]]   OTC meds as directed for congestion. Call back if symptoms worsen or persist.

## 2012-11-18 ENCOUNTER — Encounter: Payer: Self-pay | Admitting: *Deleted

## 2012-11-27 ENCOUNTER — Ambulatory Visit (INDEPENDENT_AMBULATORY_CARE_PROVIDER_SITE_OTHER): Payer: Medicare Other | Admitting: Family Medicine

## 2012-11-27 ENCOUNTER — Encounter: Payer: Self-pay | Admitting: Family Medicine

## 2012-11-27 VITALS — BP 120/72 | Temp 98.1°F | Wt 145.4 lb

## 2012-11-27 DIAGNOSIS — E7800 Pure hypercholesterolemia, unspecified: Secondary | ICD-10-CM | POA: Diagnosis not present

## 2012-11-27 DIAGNOSIS — R634 Abnormal weight loss: Secondary | ICD-10-CM | POA: Diagnosis not present

## 2012-11-27 DIAGNOSIS — E039 Hypothyroidism, unspecified: Secondary | ICD-10-CM

## 2012-11-27 DIAGNOSIS — I10 Essential (primary) hypertension: Secondary | ICD-10-CM

## 2012-11-27 DIAGNOSIS — R059 Cough, unspecified: Secondary | ICD-10-CM

## 2012-11-27 DIAGNOSIS — R05 Cough: Secondary | ICD-10-CM

## 2012-11-27 MED ORDER — LEVOFLOXACIN 500 MG PO TABS: 500.0000 mg | ORAL_TABLET | Freq: Every day | ORAL | Status: DC

## 2012-11-27 NOTE — Progress Notes (Signed)
  Subjective:    Patient ID: [name], male    DOB: 1940/11/29, 72 y.o.   MRN: [id number]  Cough This is a new problem. The current episode started 1 to 4 weeks ago. Associated symptoms include myalgias.   paced states he is trying eat healthy he also relates he had significant coughing over the past couple weeks he was seen several weeks ago treated got better then got worse he also is noted these dropped a few pounds since earlier this year he is followed by urology and also by endocrinology. PMH-previous pituitary removal, hypothyroidism, history of colon polyps [facility] benign parents were thin as they got older Patient had colonoscopy 2000 and not due until 2015  Review of Systems  Respiratory: Positive for cough.   Musculoskeletal: Positive for myalgias.   he denies fevers wheezing does relate cough no shortness breath no chest pain no night sweats. No swelling in the legs.     Objective:   Physical Exam Lungs are clear no crackles neck no masses eardrums normal throat is normal Heart is regular no murmurs pulse normal blood pressure good Abdomen soft Extremities no edema skin warm dry       Assessment & Plan:  Patient with some weight loss will check lab work. Patient may need further testing. I would like to see him back in 3 months. Encourage good dietary intake. Patient asked me what I thought of testosterone use in his situation I told him I thought it was a bad idea. Cough-probably related to postnasal drip from sinusitis Levaquin daily 10 days but chest x-ray ordered

## 2012-11-28 ENCOUNTER — Ambulatory Visit (HOSPITAL_COMMUNITY)
Admission: RE | Admit: 2012-11-28 | Discharge: 2012-11-28 | Disposition: A | Discharge status: Home / Self Care | Payer: Medicare Other | Source: Ambulatory Visit | Attending: Family Medicine | Admitting: Family Medicine

## 2012-11-28 DIAGNOSIS — R05 Cough: Secondary | ICD-10-CM | POA: Diagnosis not present

## 2012-11-28 DIAGNOSIS — I10 Essential (primary) hypertension: Secondary | ICD-10-CM | POA: Diagnosis not present

## 2012-11-28 DIAGNOSIS — E039 Hypothyroidism, unspecified: Secondary | ICD-10-CM | POA: Diagnosis not present

## 2012-11-28 DIAGNOSIS — E7800 Pure hypercholesterolemia, unspecified: Secondary | ICD-10-CM | POA: Diagnosis not present

## 2012-11-28 DIAGNOSIS — R059 Cough, unspecified: Secondary | ICD-10-CM | POA: Insufficient documentation

## 2012-11-28 LAB — CBC WITH DIFFERENTIAL/PLATELET
Basophils Relative: 1 % (ref 0–1)
Eosinophils Absolute: 0.2 10*3/uL (ref 0.0–0.7)
Eosinophils Relative: 2 % (ref 0–5)
HCT: 49.7 % (ref 39.0–52.0)
Hemoglobin: 17.4 g/dL — ABNORMAL HIGH (ref 13.0–17.0)
MCH: 32 pg (ref 26.0–34.0)
MCHC: 35 g/dL (ref 30.0–36.0)
MCV: 91.5 fL (ref 78.0–100.0)
Monocytes Absolute: 0.7 10*3/uL (ref 0.1–1.0)
Monocytes Relative: 11 % (ref 3–12)

## 2012-11-28 LAB — BASIC METABOLIC PANEL
BUN: 13 mg/dL (ref 6–23)
Chloride: 99 mEq/L (ref 96–112)
Potassium: 4.8 mEq/L (ref 3.5–5.3)

## 2012-11-28 LAB — HEPATIC FUNCTION PANEL
Bilirubin, Direct: 0.1 mg/dL (ref 0.0–0.3)
Indirect Bilirubin: 0.8 mg/dL (ref 0.0–0.9)
Total Protein: 7.3 g/dL (ref 6.0–8.3)

## 2012-11-28 LAB — LIPID PANEL
LDL Cholesterol: 167 mg/dL — ABNORMAL HIGH (ref 0–99)
Triglycerides: 238 mg/dL — ABNORMAL HIGH (ref <150)
VLDL: 48 mg/dL — ABNORMAL HIGH (ref 0–40)

## 2012-12-05 ENCOUNTER — Other Ambulatory Visit: Payer: Self-pay

## 2012-12-05 DIAGNOSIS — D582 Other hemoglobinopathies: Secondary | ICD-10-CM

## 2012-12-13 ENCOUNTER — Encounter: Payer: Self-pay | Admitting: Family Medicine

## 2012-12-13 DIAGNOSIS — D582 Other hemoglobinopathies: Secondary | ICD-10-CM | POA: Diagnosis not present

## 2012-12-13 LAB — CBC WITH DIFFERENTIAL/PLATELET
Basophils Absolute: 0 10*3/uL (ref 0.0–0.1)
Basophils Relative: 1 % (ref 0–1)
Eosinophils Absolute: 0.2 10*3/uL (ref 0.0–0.7)
Eosinophils Relative: 3 % (ref 0–5)
HCT: 47.3 % (ref 39.0–52.0)
Hemoglobin: 16.4 g/dL (ref 13.0–17.0)
Lymphocytes Relative: 34 % (ref 12–46)
Lymphs Abs: 2.2 10*3/uL (ref 0.7–4.0)
MCH: 31.8 pg (ref 26.0–34.0)
MCHC: 34.7 g/dL (ref 30.0–36.0)
MCV: 91.7 fL (ref 78.0–100.0)
Monocytes Absolute: 0.7 10*3/uL (ref 0.1–1.0)
Monocytes Relative: 10 % (ref 3–12)
Neutro Abs: 3.3 10*3/uL (ref 1.7–7.7)
Neutrophils Relative %: 52 % (ref 43–77)
Platelets: 206 10*3/uL (ref 150–400)
RBC: 5.16 MIL/uL (ref 4.22–5.81)
RDW: 13.5 % (ref 11.5–15.5)
WBC: 6.4 10*3/uL (ref 4.0–10.5)

## 2013-04-09 ENCOUNTER — Other Ambulatory Visit: Payer: Self-pay | Admitting: Endocrinology

## 2013-04-09 DIAGNOSIS — C61 Malignant neoplasm of prostate: Secondary | ICD-10-CM | POA: Diagnosis not present

## 2013-04-09 DIAGNOSIS — E232 Diabetes insipidus: Secondary | ICD-10-CM | POA: Diagnosis not present

## 2013-04-09 DIAGNOSIS — Z23 Encounter for immunization: Secondary | ICD-10-CM | POA: Diagnosis not present

## 2013-04-09 DIAGNOSIS — D443 Neoplasm of uncertain behavior of pituitary gland: Secondary | ICD-10-CM | POA: Diagnosis not present

## 2013-04-09 DIAGNOSIS — E038 Other specified hypothyroidism: Secondary | ICD-10-CM | POA: Diagnosis not present

## 2013-04-12 ENCOUNTER — Other Ambulatory Visit: Payer: Medicare Other

## 2013-04-13 ENCOUNTER — Other Ambulatory Visit: Payer: Self-pay | Admitting: *Deleted

## 2013-04-13 MED ORDER — OMEPRAZOLE 20 MG PO CPDR: 20.0000 mg | DELAYED_RELEASE_CAPSULE | Freq: Every day | ORAL | Status: DC

## 2013-04-18 ENCOUNTER — Other Ambulatory Visit: Payer: Medicare Other

## 2013-04-18 DIAGNOSIS — N529 Male erectile dysfunction, unspecified: Secondary | ICD-10-CM | POA: Diagnosis not present

## 2013-04-18 DIAGNOSIS — C61 Malignant neoplasm of prostate: Secondary | ICD-10-CM | POA: Diagnosis not present

## 2013-04-19 ENCOUNTER — Other Ambulatory Visit: Payer: Self-pay

## 2013-04-19 MED ORDER — LISINOPRIL 5 MG PO TABS: 5.0000 mg | ORAL_TABLET | Freq: Every day | ORAL | Status: DC

## 2013-04-19 MED ORDER — OMEPRAZOLE 20 MG PO CPDR: 20.0000 mg | DELAYED_RELEASE_CAPSULE | Freq: Every day | ORAL | Status: DC

## 2013-06-18 ENCOUNTER — Encounter: Payer: Self-pay | Admitting: Internal Medicine

## 2013-07-02 ENCOUNTER — Encounter: Payer: Self-pay | Admitting: Internal Medicine

## 2013-07-10 ENCOUNTER — Encounter: Payer: Self-pay | Admitting: Family Medicine

## 2013-07-10 ENCOUNTER — Ambulatory Visit (INDEPENDENT_AMBULATORY_CARE_PROVIDER_SITE_OTHER): Payer: Medicare Other | Admitting: Family Medicine

## 2013-07-10 VITALS — BP 124/68 | HR 70 | Ht 67.0 in | Wt 147.0 lb

## 2013-07-10 DIAGNOSIS — M79609 Pain in unspecified limb: Secondary | ICD-10-CM | POA: Diagnosis not present

## 2013-07-10 DIAGNOSIS — E782 Mixed hyperlipidemia: Secondary | ICD-10-CM

## 2013-07-10 DIAGNOSIS — Z23 Encounter for immunization: Secondary | ICD-10-CM

## 2013-07-10 DIAGNOSIS — M2550 Pain in unspecified joint: Secondary | ICD-10-CM

## 2013-07-10 DIAGNOSIS — D352 Benign neoplasm of pituitary gland: Secondary | ICD-10-CM | POA: Diagnosis not present

## 2013-07-10 DIAGNOSIS — M79641 Pain in right hand: Secondary | ICD-10-CM

## 2013-07-10 DIAGNOSIS — I10 Essential (primary) hypertension: Secondary | ICD-10-CM

## 2013-07-10 DIAGNOSIS — Z0000 Encounter for general adult medical examination without abnormal findings: Secondary | ICD-10-CM

## 2013-07-10 DIAGNOSIS — D353 Benign neoplasm of craniopharyngeal duct: Secondary | ICD-10-CM

## 2013-07-10 DIAGNOSIS — R5381 Other malaise: Secondary | ICD-10-CM

## 2013-07-10 DIAGNOSIS — R5383 Other fatigue: Secondary | ICD-10-CM

## 2013-07-10 DIAGNOSIS — I6509 Occlusion and stenosis of unspecified vertebral artery: Secondary | ICD-10-CM

## 2013-07-10 MED ORDER — OMEPRAZOLE 20 MG PO CPDR: 20.0000 mg | DELAYED_RELEASE_CAPSULE | Freq: Every day | ORAL | Status: DC

## 2013-07-10 MED ORDER — TRAMADOL HCL 50 MG PO TABS: 50.0000 mg | ORAL_TABLET | Freq: Three times a day (TID) | ORAL | Status: DC | PRN

## 2013-07-10 MED ORDER — LISINOPRIL 5 MG PO TABS: 5.0000 mg | ORAL_TABLET | Freq: Every day | ORAL | Status: DC

## 2013-07-10 NOTE — Progress Notes (Signed)
Subjective:    Patient ID: [name], male    DOB: June 04, 1940, 73 y.o.   MRN: [id number]  HPI AWV- Annual Wellness Visit  The patient was seen for their annual wellness visit. The patient's past medical history, surgical history, and family history were reviewed. Pertinent vaccines were reviewed ( tetanus, pneumonia, shingles, flu) The patient's medication list was reviewed and updated. The height and weight were entered. The patient's current BMI is: 23.02  Cognitive screening was completed. Outcome of Mini - Cog: Pass  Falls within the past 6 months: None Current tobacco usage: Former smoker (All patients who use tobacco were given written and verbal information on quitting)  Recent listing of emergency department/hospitalizations over the past year were reviewed.  current specialist the patient sees on a regular basis:   Medicare annual wellness visit patient questionnaire was reviewed.  A written screening schedule for the patient for the next 5-10 years was given. Appropriate discussion of followup regarding next visit was discussed.   Concerns about arthritis in right hand. Not taking any meds at this time for pain.     Review of Systems  Constitutional: Negative for fever, diaphoresis, activity change and appetite change.  HENT: Negative for congestion and rhinorrhea.   Eyes: Negative for discharge.  Respiratory: Negative for cough, chest tightness and wheezing.   Cardiovascular: Negative for chest pain and leg swelling.  Gastrointestinal: Negative for vomiting, abdominal pain and blood in stool.  Endocrine: Positive for cold intolerance and polyuria. Negative for polydipsia and polyphagia.  Genitourinary: Negative for frequency, hematuria and difficulty urinating.  Musculoskeletal: Negative for neck pain.  Skin: Negative for rash.  Allergic/Immunologic: Negative for environmental allergies and food allergies.  Neurological: Positive for light-headedness and  headaches. Negative for seizures, speech difficulty and weakness.  Hematological: Negative for adenopathy. Does not bruise/bleed easily.  Psychiatric/Behavioral: Negative for behavioral problems and agitation.       Objective:   Physical Exam  Constitutional: He appears well-developed and well-nourished.  HENT:  Head: Normocephalic and atraumatic.  Right Ear: External ear normal.  Left Ear: External ear normal.  Nose: Nose normal.  Mouth/Throat: Oropharynx is clear and moist.  Eyes: EOM are normal.  Neck: Neck supple. No thyromegaly present.  Cardiovascular: Normal rate, regular rhythm and normal heart sounds.   No murmur heard. Pulmonary/Chest: Effort normal and breath sounds normal. No respiratory distress. He has no wheezes.  Abdominal: Soft. Bowel sounds are normal. He exhibits no distension and no mass. There is no tenderness.  Musculoskeletal: Normal range of motion. He exhibits no edema and no tenderness.  Lymphadenopathy:    He has no cervical adenopathy.  Neurological: He is alert. He exhibits normal muscle tone. Coordination normal.  Skin: Skin is warm and dry. No erythema.  Psychiatric: He has a normal mood and affect. His behavior is normal. Judgment normal.          Assessment & Plan:  #1 annual wellness visit-vaccines updated recommendation for shingles vaccine. Patient also recommended to do colonoscopy he will be getting that through Dr. Earnie Larsson he stated he is setting that up along with endoscopy because of dysphagia safety measures dietary measures discussed.  #2 recent moderate headaches with a history of pituitary adenoma his endocrinologist recommend he repeat MRI of the brain we will go ahead and set that up with and without contrast to make sure there is no reoccurrence of tumor. This patient had pituitary adenoma surgery for growth back in 2007 had MRI in 2011  it's been 4 years since these haven't increasing frequency of headaches that are severe or cause  nausea I told him it would be wise to repeat MRI to make sure there is no sign of reoccurrence of the pituitary tumor  #3 hyperlipidemia as not tolerated statins in the past recheck lab work  #4 history prostate cancer follows with prostate specialist on a yearly basis, he has had a prostatectomy back in 2010  #5 hand arthritis-x-ray to see there is any bony destruction stick with naproxen currently over-the-counter if progressive symptoms may need referral to rheumatology use Ultram when necessary currently  #6 patient had polycythemia on last CBC rechecked CBC  Involvement of medical problems 99214 in addition to his annual wellness visit

## 2013-07-16 DIAGNOSIS — I10 Essential (primary) hypertension: Secondary | ICD-10-CM | POA: Diagnosis not present

## 2013-07-16 DIAGNOSIS — E782 Mixed hyperlipidemia: Secondary | ICD-10-CM | POA: Diagnosis not present

## 2013-07-16 DIAGNOSIS — R5381 Other malaise: Secondary | ICD-10-CM | POA: Diagnosis not present

## 2013-07-16 DIAGNOSIS — M2550 Pain in unspecified joint: Secondary | ICD-10-CM | POA: Diagnosis not present

## 2013-07-16 LAB — BASIC METABOLIC PANEL
BUN: 13 mg/dL (ref 6–23)
CHLORIDE: 97 meq/L (ref 96–112)
CO2: 29 meq/L (ref 19–32)
Calcium: 9 mg/dL (ref 8.4–10.5)
Creat: 1.1 mg/dL (ref 0.50–1.35)
Glucose, Bld: 94 mg/dL (ref 70–99)
Potassium: 4.9 mEq/L (ref 3.5–5.3)
Sodium: 133 mEq/L — ABNORMAL LOW (ref 135–145)

## 2013-07-16 LAB — CBC WITH DIFFERENTIAL/PLATELET
BASOS PCT: 1 % (ref 0–1)
Basophils Absolute: 0 10*3/uL (ref 0.0–0.1)
EOS ABS: 0.4 10*3/uL (ref 0.0–0.7)
EOS PCT: 9 % — AB (ref 0–5)
HCT: 49.6 % (ref 39.0–52.0)
Hemoglobin: 17.4 g/dL — ABNORMAL HIGH (ref 13.0–17.0)
Lymphocytes Relative: 29 % (ref 12–46)
Lymphs Abs: 1.4 10*3/uL (ref 0.7–4.0)
MCH: 31.5 pg (ref 26.0–34.0)
MCHC: 35.1 g/dL (ref 30.0–36.0)
MCV: 89.9 fL (ref 78.0–100.0)
MONOS PCT: 15 % — AB (ref 3–12)
Monocytes Absolute: 0.7 10*3/uL (ref 0.1–1.0)
NEUTROS PCT: 46 % (ref 43–77)
Neutro Abs: 2.2 10*3/uL (ref 1.7–7.7)
PLATELETS: 161 10*3/uL (ref 150–400)
RBC: 5.52 MIL/uL (ref 4.22–5.81)
RDW: 13.2 % (ref 11.5–15.5)
WBC: 4.8 10*3/uL (ref 4.0–10.5)

## 2013-07-16 LAB — LIPID PANEL
CHOLESTEROL: 178 mg/dL (ref 0–200)
HDL: 35 mg/dL — ABNORMAL LOW (ref >39)
LDL Cholesterol: 121 mg/dL — ABNORMAL HIGH (ref 0–99)
Total CHOL/HDL Ratio: 5.1 Ratio
Triglycerides: 110 mg/dL (ref <150)
VLDL: 22 mg/dL (ref 0–40)

## 2013-07-16 LAB — SEDIMENTATION RATE: Sed Rate: 11 mm/hr (ref 0–16)

## 2013-07-18 ENCOUNTER — Ambulatory Visit (HOSPITAL_COMMUNITY)
Admission: RE | Admit: 2013-07-18 | Discharge: 2013-07-18 | Disposition: A | Discharge status: Home / Self Care | Payer: Medicare Other | Source: Ambulatory Visit | Attending: Family Medicine | Admitting: Family Medicine

## 2013-07-18 DIAGNOSIS — M19079 Primary osteoarthritis, unspecified ankle and foot: Secondary | ICD-10-CM | POA: Insufficient documentation

## 2013-07-18 DIAGNOSIS — H538 Other visual disturbances: Secondary | ICD-10-CM | POA: Diagnosis not present

## 2013-07-18 DIAGNOSIS — C61 Malignant neoplasm of prostate: Secondary | ICD-10-CM | POA: Diagnosis not present

## 2013-07-18 DIAGNOSIS — I6789 Other cerebrovascular disease: Secondary | ICD-10-CM | POA: Diagnosis not present

## 2013-07-18 DIAGNOSIS — M19049 Primary osteoarthritis, unspecified hand: Secondary | ICD-10-CM | POA: Diagnosis not present

## 2013-07-18 MED ORDER — GADOBENATE DIMEGLUMINE 529 MG/ML IV SOLN
7.0000 mL | Freq: Once | INTRAVENOUS | Status: AC | PRN
  Administered 2013-07-18: 7 mL via INTRAVENOUS

## 2013-07-19 ENCOUNTER — Other Ambulatory Visit: Payer: Self-pay

## 2013-07-19 DIAGNOSIS — D582 Other hemoglobinopathies: Secondary | ICD-10-CM

## 2013-07-31 ENCOUNTER — Ambulatory Visit (INDEPENDENT_AMBULATORY_CARE_PROVIDER_SITE_OTHER): Payer: Medicare Other | Admitting: Family Medicine

## 2013-07-31 ENCOUNTER — Encounter: Payer: Self-pay | Admitting: Family Medicine

## 2013-07-31 VITALS — BP 112/70 | Temp 98.0°F | Ht 67.0 in | Wt 148.2 lb

## 2013-07-31 DIAGNOSIS — R220 Localized swelling, mass and lump, head: Secondary | ICD-10-CM

## 2013-07-31 DIAGNOSIS — R221 Localized swelling, mass and lump, neck: Secondary | ICD-10-CM

## 2013-07-31 NOTE — Progress Notes (Signed)
   Subjective:    Patient ID: [name], male    DOB: 1941/01/28, 73 y.o.   MRN: [id number]  Sore Throat  This is a new problem. The current episode started in the past 7 days. The problem has been unchanged. The pain is worse on the left side. There has been no fever. The pain is moderate. Associated symptoms include swollen glands. Pertinent negatives include no congestion, coughing or ear pain. He has tried NSAIDs and acetaminophen for the symptoms. The treatment provided no relief.  Patient states he can't hear very well out of his left ear also. He thinks this may be related.   He relates pain in the left side of the neck. He also relates pain with swallowing. Denies any specific injury. Patient is a former smoker. Quit several years ago  Review of Systems  Constitutional: Negative for fever and activity change.  HENT: Negative for congestion, ear pain and rhinorrhea.   Eyes: Negative for discharge.  Respiratory: Negative for cough and wheezing.   Cardiovascular: Negative for chest pain.       Objective:   Physical Exam  Nursing note and vitals reviewed. Constitutional: He appears well-developed.  HENT:  Head: Normocephalic.  Mouth/Throat: Oropharynx is clear and moist. No oropharyngeal exudate.  Neck: Normal range of motion.  Cardiovascular: Normal rate, regular rhythm and normal heart sounds.   No murmur heard. Pulmonary/Chest: Effort normal and breath sounds normal. He has no wheezes.  Lymphadenopathy:    He has no cervical adenopathy.  Neurological: He exhibits normal muscle tone.  Skin: Skin is warm and dry.          Assessment & Plan:  Left-sided neck pain seems to be deep near the hyoid bone with the throat pain he is having I believe he needs ENT evaluation and will need laryngoscopic he to help make sure he does not have tumor or other problems. May use tramadol for discomfort.

## 2013-08-03 ENCOUNTER — Encounter: Payer: Self-pay | Admitting: Family Medicine

## 2013-08-09 ENCOUNTER — Telehealth: Payer: Self-pay | Admitting: Family Medicine

## 2013-08-09 MED ORDER — TRIAMCINOLONE ACETONIDE 0.1 % EX CREA: 1.0000 "application " | TOPICAL_CREAM | Freq: Two times a day (BID) | CUTANEOUS | Status: DC

## 2013-08-09 NOTE — Telephone Encounter (Signed)
Last seen 07/31/13

## 2013-08-09 NOTE — Telephone Encounter (Signed)
triamcin .1% bid 60 g two ref

## 2013-08-09 NOTE — Telephone Encounter (Signed)
Med sent to pharm. Pt notified.  

## 2013-08-09 NOTE — Telephone Encounter (Signed)
Pt has tried ketconazole cream 2%, mometasonfuroate 1% cream, and clobetasol 0.05% cream without relief

## 2013-08-09 NOTE — Telephone Encounter (Signed)
Name of old cream????????

## 2013-08-09 NOTE — Telephone Encounter (Signed)
Pt states he is having a rash that itches terribly, runs up his legs to his waist.   Been going on for about a month, progressively gotten worse. Used an old cream  He had for another rash but it hasn't worked.   Pt thinks Dr Nicki Reaper told him at some point he thought he had psoriasis   [facility]

## 2013-08-14 ENCOUNTER — Ambulatory Visit (HOSPITAL_COMMUNITY)
Admission: RE | Admit: 2013-08-14 | Discharge: 2013-08-14 | Disposition: A | Discharge status: Home / Self Care | Payer: Medicare Other | Source: Ambulatory Visit | Attending: Internal Medicine | Admitting: Internal Medicine

## 2013-08-14 ENCOUNTER — Encounter: Payer: Self-pay | Admitting: Internal Medicine

## 2013-08-14 ENCOUNTER — Ambulatory Visit (INDEPENDENT_AMBULATORY_CARE_PROVIDER_SITE_OTHER): Payer: Medicare Other | Admitting: Internal Medicine

## 2013-08-14 VITALS — BP 130/68 | HR 72 | Ht 67.0 in | Wt 148.0 lb

## 2013-08-14 DIAGNOSIS — K219 Gastro-esophageal reflux disease without esophagitis: Secondary | ICD-10-CM

## 2013-08-14 DIAGNOSIS — M79605 Pain in left leg: Secondary | ICD-10-CM

## 2013-08-14 DIAGNOSIS — R1310 Dysphagia, unspecified: Secondary | ICD-10-CM | POA: Diagnosis not present

## 2013-08-14 DIAGNOSIS — Z86010 Personal history of colonic polyps: Secondary | ICD-10-CM

## 2013-08-14 DIAGNOSIS — M79609 Pain in unspecified limb: Secondary | ICD-10-CM

## 2013-08-14 MED ORDER — MOVIPREP 100 G PO SOLR: 1.0000 | Freq: Once | ORAL | Status: DC

## 2013-08-14 NOTE — Patient Instructions (Addendum)
You have been scheduled for an endoscopy and colonoscopy with propofol. Please follow the written instructions given to you at your visit today. Please pick up your prep at the pharmacy within the next 1-3 days. If you use inhalers (even only as needed), please bring them with you on the day of your procedure. Your physician has requested that you go to www.startemmi.com and enter the access code given to you at your visit today. This web site gives a general overview about your procedure. However, you should still follow specific instructions given to you by our office regarding your preparation for the procedure.   You have been scheduled for a doppler ultrasound of your lower left leg at 11:30 at [facility].  Please arrive 15 minutes early.  Go to admitting in the [facility], Entrance A

## 2013-08-14 NOTE — Progress Notes (Signed)
HISTORY OF PRESENT ILLNESS:  [name] is a 73 y.o. male with a history of pituitary tumor status post surgical resection, currently on endocrine replacement therapy, hypertension, hyperlipidemia, chronic GERD, and adenomatous colon polyps. He was last evaluated 03/08/2012 regarding acute diarrheal illness and abdominal pain. See that dictation for details. Prior history of adenomatous colon polyps elsewhere. Last colonoscopy March 2010. No polyps. Severe left-sided diverticulosis with deep folds and stenosis of the sigmoid colon noted. Followup in 5 years recommended. He has received a recall letter. Patient has a new complaint of dysphagia 6 months duration. He describes it vaguely as "can't swallow". He has only had no problems with solids. Proximally 6 times over the past 6 months. No weight loss. Actually mild weight gain. He continues on PPI. Denies classic reflux symptoms. He continues with occasional constipation. No other GI complaints. Chronic medical problems are stable review of Torres from 07/16/2013 include basic chemistries, CBC with differential, sedimentation rate, and lipid profile. No significant abnormalities.  REVIEW OF SYSTEMS:  All non-GI ROS negative except for sinus and allergy, arthritis, back pain, fatigue, headaches, hearing problems, itching, shortness of breath, skin rash, sleeping problems, increased thirst, increased urination, urinary leakage. Also, 2 day history of right calf pain and right groin pain.  Past Medical History  Diagnosis Date  . Colon polyps     adenomatous  . Diverticulosis   . GERD (gastroesophageal reflux disease)   . Hypercholesterolemia   . Hypertension   . Prostate cancer   . Arthritis     Past Surgical History  Procedure Laterality Date  . Cholecystectomy  08/2007  . Removal of pituitary tumor  9/09  . Back surgery  1991  . Prostate surgery      Social History [name]  reports that he has quit smoking. He quit smokeless  tobacco use about 35 years ago. He reports that he does not drink alcohol or use illicit drugs.  family history includes Clotting disorder in his father; Heart attack in his father; Heart disease in his father. There is no history of Colon cancer or Diabetes.  Allergies  Allergen Reactions  . Codeine   . Morphine        PHYSICAL EXAMINATION: Vital signs: BP 130/68  Pulse 72  Ht 5\' 7"  (1.702 m)  Wt 148 lb (67.132 kg)  BMI 23.17 kg/m2  Constitutional: generally well-appearing, no acute distress Psychiatric: alert and oriented x3, cooperative Eyes: extraocular movements intact, anicteric, conjunctiva pink Mouth: oral pharynx moist, no lesions Neck: supple no lymphadenopathy Cardiovascular: heart regular rate and rhythm, no murmur Lungs: clear to auscultation bilaterally Abdomen: soft, nontender, nondistended, no obvious ascites, no peritoneal signs, normal bowel sounds, no organomegaly Rectal: Deferred until colonoscopy Extremities: no lower extremity edema bilaterally. Tenderness to the right calf with palpation. Positive Homans sign Skin: Hypervascularity of the facial skin. no lesions on visible extremities Neuro: No focal deficits. No asterixis.    ASSESSMENT:  #1. Chronic GERD. Classic symptoms controlled with PPI #2. Six-month history of intermittent solid food dysphagia. Somewhat vaguely described. Rule out peptic stricture #3. History of adenomatous colon polyps. Due for surveillance #4. Severe diverticulosis #5. Today history of right calf and groin discomfort. Rule out DVT   PLAN:  #1. Doppler ultrasound of the right lower extremity today. Rule out DVT. If positive, he will followup with PCP (Dr. Sallee Lange) for treatment. If negative, she will also followup with PCP for further assessment of her lower extremities discomfort #2. Continue PPI #  3. Upper endoscopy with possible esophageal dilation.The nature of the procedure, as well as the risks, benefits, and  alternatives were carefully and thoroughly reviewed with the patient. Ample time for discussion and questions allowed. The patient understood, was satisfied, and agreed to proceed. #4. Surveillance colonoscopy.The nature of the procedure, as well as the risks, benefits, and alternatives were carefully and thoroughly reviewed with the patient. Ample time for discussion and questions allowed. The patient understood, was satisfied, and agreed to proceed. Movi prep prescribed. Patient instructed on its use

## 2013-08-14 NOTE — Progress Notes (Signed)
VASCULAR LAB PRELIMINARY  PRELIMINARY  PRELIMINARY  PRELIMINARY  Right lower extremity venous duplex completed.    Preliminary report:  Right:  No evidence of DVT, superficial thrombosis, or Baker's cyst.  Reizel Calzada, RVS 08/14/2013, 12:00 PM

## 2013-08-15 ENCOUNTER — Telehealth: Payer: Self-pay

## 2013-08-15 NOTE — Telephone Encounter (Signed)
Message copied by Audrea Muscat on Wed Aug 15, 2013 11:11 AM ------      Message from: Irene Shipper      Created: Tue Aug 14, 2013  2:04 PM       Please contact the patient and let him know that his vascular study looked fine. No blood clot. That is good. If this late continues to bother him, he should see his primary care physician. Otherwise, keep plans for colon and EGD as scheduled today. Thanks. Convert this to phone note for the record. Dr. Henrene Pastor      ----- Message -----         From: Audrea Muscat, CMA         Sent: 08/14/2013  11:47 AM           To: Irene Shipper, MD            Per the vascular center where patient's doppler was performed - the results were "negative".  She said we should call the patient if we have any further instructions for him.       ------

## 2013-08-15 NOTE — Telephone Encounter (Signed)
Per Dr. Henrene Pastor, called patient to relay message that his vascular study was fine and showed no blood clots.  I instructed him to contact his pcp if he continued to have problems.  Patient agreed and understood

## 2013-08-20 ENCOUNTER — Encounter: Payer: Medicare Other | Admitting: Internal Medicine

## 2013-08-22 ENCOUNTER — Encounter: Payer: Self-pay | Admitting: Internal Medicine

## 2013-08-22 ENCOUNTER — Ambulatory Visit (AMBULATORY_SURGERY_CENTER): Payer: Medicare Other | Admitting: Internal Medicine

## 2013-08-22 VITALS — BP 124/71 | HR 51 | Temp 97.6°F | Resp 15 | Ht 67.0 in | Wt 148.0 lb

## 2013-08-22 DIAGNOSIS — Z86010 Personal history of colonic polyps: Secondary | ICD-10-CM

## 2013-08-22 DIAGNOSIS — I10 Essential (primary) hypertension: Secondary | ICD-10-CM | POA: Diagnosis not present

## 2013-08-22 DIAGNOSIS — D126 Benign neoplasm of colon, unspecified: Secondary | ICD-10-CM | POA: Diagnosis not present

## 2013-08-22 DIAGNOSIS — K219 Gastro-esophageal reflux disease without esophagitis: Secondary | ICD-10-CM | POA: Diagnosis not present

## 2013-08-22 DIAGNOSIS — R1310 Dysphagia, unspecified: Secondary | ICD-10-CM | POA: Diagnosis not present

## 2013-08-22 DIAGNOSIS — E039 Hypothyroidism, unspecified: Secondary | ICD-10-CM | POA: Diagnosis not present

## 2013-08-22 MED ORDER — SODIUM CHLORIDE 0.9 % IV SOLN: 500.0000 mL | INTRAVENOUS | Status: DC

## 2013-08-22 NOTE — Patient Instructions (Signed)
YOU HAD AN ENDOSCOPIC PROCEDURE TODAY AT THE [facility]: Refer to the procedure report that was given to you for any specific questions about what was found during the examination.  If the procedure report does not answer your questions, please call your gastroenterologist to clarify.  If you requested that your care partner not be given the details of your procedure findings, then the procedure report has been included in a sealed envelope for you to review at your convenience later.  YOU SHOULD EXPECT: Some feelings of bloating in the abdomen. Passage of more gas than usual.  Walking can help get rid of the air that was put into your GI tract during the procedure and reduce the bloating. If you had a lower endoscopy (such as a colonoscopy or flexible sigmoidoscopy) you may notice spotting of blood in your stool or on the toilet paper. If you underwent a bowel prep for your procedure, then you may not have a normal bowel movement for a few days.  DIET: Your first meal following the procedure should be a light meal and then it is ok to progress to your normal diet.  A half-sandwich or bowl of soup is an example of a good first meal.  Heavy or fried foods are harder to digest and may make you feel nauseous or bloated.  Likewise meals heavy in dairy and vegetables can cause extra gas to form and this can also increase the bloating.  Drink plenty of fluids but you should avoid alcoholic beverages for 24 hours.  ACTIVITY: Your care partner should take you home directly after the procedure.  You should plan to take it easy, moving slowly for the rest of the day.  You can resume normal activity the day after the procedure however you should NOT DRIVE or use heavy machinery for 24 hours (because of the sedation medicines used during the test).    SYMPTOMS TO REPORT IMMEDIATELY: A gastroenterologist can be reached at any hour.  During normal business hours, 8:30 AM to 5:00 PM Monday through Friday,  call [phone].  After hours and on weekends, please call the GI answering service at [phone] who will take a message and have the physician on call contact you.   Following lower endoscopy (colonoscopy or flexible sigmoidoscopy):  Excessive amounts of blood in the stool  Significant tenderness or worsening of abdominal pains  Swelling of the abdomen that is new, acute  Fever of 100F or higher  Following upper endoscopy (EGD)  Vomiting of blood or coffee ground material  New chest pain or pain under the shoulder blades  Painful or persistently difficult swallowing  New shortness of breath  Fever of 100F or higher  Black, tarry-looking stools  FOLLOW UP: If any biopsies were taken you will be contacted by phone or by letter within the next 1-3 weeks.  Call your gastroenterologist if you have not heard about the biopsies in 3 weeks.  Our staff will call the home number listed on your records the next business day following your procedure to check on you and address any questions or concerns that you may have at that time regarding the information given to you following your procedure. This is a courtesy call and so if there is no answer at the home number and we have not heard from you through the emergency physician on call, we will assume that you have returned to your regular daily activities without incident.  SIGNATURES/CONFIDENTIALITY: You and/or your care   partner have signed paperwork which will be entered into your electronic medical record.  These signatures attest to the fact that that the information above on your After Visit Summary has been reviewed and is understood.  Full responsibility of the confidentiality of this discharge information lies with you and/or your care-partner.   Resume medications. Information given on Gerd, Diverticulosis and high fiber diet with discharge instructions. Office with contact you about Barium Esophagram.

## 2013-08-22 NOTE — Op Note (Signed)
[place]  [address] [place], [address]   ENDOSCOPY PROCEDURE REPORT  PATIENT: Lance, Garrison  MR#: [id number] BIRTHDATE: 15-Feb-1941 , 46  yrs. old GENDER: Male ENDOSCOPIST: Eustace Quail, MD REFERRED BY:  .  Self / Office PROCEDURE DATE:  08/22/2013 PROCEDURE:  EGD, diagnostic ASA CLASS:     Class II INDICATIONS:  Dysphagia. MEDICATIONS: MAC sedation, administered by CRNA and propofol (Diprivan) 50mg  IV TOPICAL ANESTHETIC: none  DESCRIPTION OF PROCEDURE: After the risks benefits and alternatives of the procedure were thoroughly explained, informed consent was obtained.  The LB [id number] endoscope was introduced through the mouth and advanced to the second portion of the duodenum. Without limitations.  The instrument was slowly withdrawn as the mucosa was fully examined.      EXAM:The cricopharyngeal area was somewhat tight without obvious mass, friability, or ring.  The distal esophagus revealed a large caliber benign stricture or ring.  No inflammation or other abnormalities.  The stomach revealed benign fundic gland type polyps but was otherwise normal.  The duodenum was normal. Retroflexed views revealed no abnormalities.     The scope was then withdrawn from the patient and the procedure completed.  COMPLICATIONS: There were no complications. ENDOSCOPIC IMPRESSION: 1. Somewhat tight cricopharyngeal area. Rule out cricopharyngeal bar or cervical web 2. Large caliber distal esophageal ring or stricture 3. GERD.  RECOMMENDATIONS: 1. Continue PPI for GERD 2. My office will schedule barium esophagram with tablet "rule out cricopharyngeal bar or cervical web, dysphagia"  REPEAT EXAM:  eSigned:  Eustace Quail, MD 08/22/2013 10:43 AM   CC:The Patient and Sallee Lange, MD

## 2013-08-22 NOTE — Progress Notes (Signed)
Report to pacu rn, vss, bbs=clear 

## 2013-08-22 NOTE — Op Note (Signed)
[place]  [address]. [place], [address]   COLONOSCOPY PROCEDURE REPORT  PATIENT: [name], [name]  MR#: [id number] BIRTHDATE: March 03, 1941 , 72  yrs. old GENDER: Male ENDOSCOPIST: Eustace Quail, MD REFERRED EN:[id number] Program Recall PROCEDURE DATE:  08/22/2013 PROCEDURE:   Colonoscopy, surveillance First Screening Colonoscopy - Avg.  risk and is 50 yrs.  old or older - No.  Prior Negative Screening - Now for repeat screening. N/A  History of Adenoma - Now for follow-up colonoscopy & has been > or = to 3 yrs.  Yes hx of adenoma.  Has been 3 or more years since last colonoscopy.  Polyps Removed Today? No.  Recommend repeat exam, <10 yrs? No. ASA CLASS:   Class II INDICATIONS:Patient's personal history of adenomatous colon polyps(elsewhere); last exam here March 2010-negative except for severe diverticulosis. MEDICATIONS: MAC sedation, administered by CRNA and propofol (Diprivan) 350mg  IV  DESCRIPTION OF PROCEDURE:   After the risks benefits and alternatives of the procedure were thoroughly explained, informed consent was obtained.  A digital rectal exam revealed no abnormalities of the rectum.   The LB [id number]  endoscope was introduced through the anus and advanced to the cecum, which was identified by both the appendix and ileocecal valve. No adverse events experienced.   The quality of the prep was excellent, using MoviPrep  The instrument was then slowly withdrawn as the colon was fully examined.      COLON FINDINGS: Severe diverticulosis was noted in the ascending colon and the left colon.   The colon mucosa was otherwise normal. Retroflexed views revealed internal hemorrhoids. The time to cecum=2 minutes 33 seconds.  Withdrawal time=8 minutes 03 seconds. The scope was withdrawn and the procedure completed.  COMPLICATIONS: There were no complications.  ENDOSCOPIC IMPRESSION: 1.   Severe diverticulosis was noted in the ascending  colon and left colon 2.   The colon mucosa was otherwise normal  RECOMMENDATIONS: 1.  Continue current colorectal screening recommendations for "routine risk" patients with a repeat colonoscopy in 10 years if medically fit and willing. We'll reassess clinical status at that time. 2.  Upper endoscopy today (see report)   eSigned:  Eustace Quail, MD 08/22/2013 10:38 AM   cc: Sallee Lange, MD and The Patient

## 2013-08-23 ENCOUNTER — Telehealth: Payer: Self-pay | Admitting: *Deleted

## 2013-08-23 NOTE — Telephone Encounter (Signed)
Left message that we called for f/u 

## 2013-08-27 ENCOUNTER — Telehealth: Payer: Self-pay | Admitting: Family Medicine

## 2013-08-27 DIAGNOSIS — L259 Unspecified contact dermatitis, unspecified cause: Secondary | ICD-10-CM | POA: Diagnosis not present

## 2013-08-27 DIAGNOSIS — L821 Other seborrheic keratosis: Secondary | ICD-10-CM | POA: Diagnosis not present

## 2013-08-27 DIAGNOSIS — D235 Other benign neoplasm of skin of trunk: Secondary | ICD-10-CM | POA: Diagnosis not present

## 2013-08-27 MED ORDER — CHLORZOXAZONE 500 MG PO TABS: 500.0000 mg | ORAL_TABLET | Freq: Three times a day (TID) | ORAL | Status: DC | PRN

## 2013-08-27 NOTE — Telephone Encounter (Signed)
Patient said that the Tramadol that you prescribed for the muscle in his neck and back is entirely too strong for him and wants to know if there is any way you can give him a muscle relaxer or anti-inflammatory.   [facility]

## 2013-08-27 NOTE — Telephone Encounter (Signed)
Left message on voicemail to return call. (Parafon forte was sent to pharmacy)

## 2013-08-27 NOTE — Telephone Encounter (Signed)
Discussed with patient

## 2013-08-27 NOTE — Telephone Encounter (Signed)
#  1 DC tramadol #2 Parafon Forte, 1 every 8 hours when necessary, home use only, #35 with 1 refill. Tylenol for pain, followup if ongoing

## 2013-08-30 ENCOUNTER — Telehealth: Payer: Self-pay | Admitting: Family Medicine

## 2013-08-30 NOTE — Telephone Encounter (Signed)
[facility] 08/30/13

## 2013-08-30 NOTE — Telephone Encounter (Signed)
Discussed with patient. Transferred to front to schedule appt tomorr am with Dr. Nicki Reaper.

## 2013-08-30 NOTE — Telephone Encounter (Signed)
Pt states he has not been seen for this but chloraxazone was sent in for him on 08/27/13. Taking one every 8 hours and 4 aleve every day. He wants something else called in or does he need office visit.

## 2013-08-30 NOTE — Telephone Encounter (Signed)
Patient says that he is still experiencing great pain in his groin and from his knee down to his foot. He said its affecting his sleep and would like to know what to do.

## 2013-08-30 NOTE — Telephone Encounter (Signed)
It would be best for this patient to be seen. Tomorrow morning would be fine. I realize I have limited appointments

## 2013-08-31 ENCOUNTER — Encounter: Payer: Self-pay | Admitting: Family Medicine

## 2013-08-31 ENCOUNTER — Ambulatory Visit (INDEPENDENT_AMBULATORY_CARE_PROVIDER_SITE_OTHER): Payer: Medicare Other | Admitting: Family Medicine

## 2013-08-31 VITALS — BP 128/76 | Ht 67.0 in | Wt 151.6 lb

## 2013-08-31 DIAGNOSIS — R1030 Lower abdominal pain, unspecified: Secondary | ICD-10-CM

## 2013-08-31 DIAGNOSIS — M25569 Pain in unspecified knee: Secondary | ICD-10-CM | POA: Diagnosis not present

## 2013-08-31 DIAGNOSIS — R109 Unspecified abdominal pain: Secondary | ICD-10-CM

## 2013-08-31 MED ORDER — PREDNISONE 20 MG PO TABS: ORAL_TABLET | ORAL | Status: DC

## 2013-08-31 NOTE — Progress Notes (Signed)
   Subjective:    Patient ID: [name], male    DOB: 1941/02/05, 73 y.o.   MRN: [id number]  HPI Patient arrives with complaint of groin pain for few weeks. Pain is worse with getting in and out of a truck sitting for a long length of time sometimes standing from a chair denies any problems when he is walking. Denies any back pain numbness or tingling.  Review of Systems Denies hematuria vomiting diarrhea denies abdominal pain no fevers relates some burning discomfort on the right outer aspect of his leg on the calf region recent Doppler test negative for DVT    Objective:   Physical Exam  Lung is Clear hearts regular groin mild tenderness in the right groin area extremities no edema skin warm dry neurologic grossly normal      Assessment & Plan:  Groin discomfort I feel this is more of a tendon-related issue. Physical therapy indicated. Prednisone taper. If persistent trouble may need referral to [facility]. Pulses in his feet are good I don't feel this is a circulation problem

## 2013-09-03 ENCOUNTER — Ambulatory Visit (HOSPITAL_COMMUNITY)
Admission: RE | Admit: 2013-09-03 | Discharge: 2013-09-03 | Disposition: A | Discharge status: Home / Self Care | Payer: Medicare Other | Source: Ambulatory Visit | Attending: Family Medicine | Admitting: Family Medicine

## 2013-09-03 DIAGNOSIS — M25559 Pain in unspecified hip: Secondary | ICD-10-CM | POA: Diagnosis not present

## 2013-09-03 DIAGNOSIS — M25859 Other specified joint disorders, unspecified hip: Secondary | ICD-10-CM | POA: Diagnosis not present

## 2013-09-03 DIAGNOSIS — D582 Other hemoglobinopathies: Secondary | ICD-10-CM | POA: Diagnosis not present

## 2013-09-03 LAB — CBC
HCT: 47 % (ref 39.0–52.0)
HEMOGLOBIN: 16.8 g/dL (ref 13.0–17.0)
MCH: 31.4 pg (ref 26.0–34.0)
MCHC: 35.7 g/dL (ref 30.0–36.0)
MCV: 87.9 fL (ref 78.0–100.0)
Platelets: 256 10*3/uL (ref 150–400)
RBC: 5.35 MIL/uL (ref 4.22–5.81)
RDW: 14.1 % (ref 11.5–15.5)
WBC: 14.2 10*3/uL — AB (ref 4.0–10.5)

## 2013-09-03 NOTE — Progress Notes (Signed)
Patient notified and verbalized understanding of the test results. No further questions. 

## 2013-09-04 NOTE — Progress Notes (Signed)
Patient notified and verbalized understanding of the test results. No further questions. 

## 2013-09-12 DIAGNOSIS — L678 Other hair color and hair shaft abnormalities: Secondary | ICD-10-CM | POA: Diagnosis not present

## 2013-09-12 DIAGNOSIS — L738 Other specified follicular disorders: Secondary | ICD-10-CM | POA: Diagnosis not present

## 2013-09-12 DIAGNOSIS — L270 Generalized skin eruption due to drugs and medicaments taken internally: Secondary | ICD-10-CM | POA: Diagnosis not present

## 2013-09-12 DIAGNOSIS — L259 Unspecified contact dermatitis, unspecified cause: Secondary | ICD-10-CM | POA: Diagnosis not present

## 2013-09-17 ENCOUNTER — Ambulatory Visit (HOSPITAL_COMMUNITY)
Admission: RE | Admit: 2013-09-17 | Discharge: 2013-09-17 | Disposition: A | Discharge status: Home / Self Care | Payer: Medicare Other | Source: Ambulatory Visit | Attending: Family Medicine | Admitting: Family Medicine

## 2013-09-17 ENCOUNTER — Ambulatory Visit (INDEPENDENT_AMBULATORY_CARE_PROVIDER_SITE_OTHER): Payer: Medicare Other | Admitting: Family Medicine

## 2013-09-17 ENCOUNTER — Encounter: Payer: Self-pay | Admitting: Family Medicine

## 2013-09-17 VITALS — BP 120/78 | Temp 98.6°F | Ht 67.0 in | Wt 142.0 lb

## 2013-09-17 DIAGNOSIS — R252 Cramp and spasm: Secondary | ICD-10-CM

## 2013-09-17 DIAGNOSIS — M545 Low back pain, unspecified: Secondary | ICD-10-CM | POA: Diagnosis not present

## 2013-09-17 DIAGNOSIS — R29898 Other symptoms and signs involving the musculoskeletal system: Secondary | ICD-10-CM | POA: Insufficient documentation

## 2013-09-17 DIAGNOSIS — E039 Hypothyroidism, unspecified: Secondary | ICD-10-CM | POA: Diagnosis not present

## 2013-09-17 DIAGNOSIS — R21 Rash and other nonspecific skin eruption: Secondary | ICD-10-CM | POA: Diagnosis not present

## 2013-09-17 DIAGNOSIS — IMO0001 Reserved for inherently not codable concepts without codable children: Secondary | ICD-10-CM | POA: Insufficient documentation

## 2013-09-17 DIAGNOSIS — M5410 Radiculopathy, site unspecified: Secondary | ICD-10-CM | POA: Insufficient documentation

## 2013-09-17 DIAGNOSIS — M2560 Stiffness of unspecified joint, not elsewhere classified: Secondary | ICD-10-CM | POA: Insufficient documentation

## 2013-09-17 DIAGNOSIS — R262 Difficulty in walking, not elsewhere classified: Secondary | ICD-10-CM | POA: Diagnosis not present

## 2013-09-17 DIAGNOSIS — M79609 Pain in unspecified limb: Secondary | ICD-10-CM | POA: Diagnosis not present

## 2013-09-17 DIAGNOSIS — M2569 Stiffness of other specified joint, not elsewhere classified: Secondary | ICD-10-CM | POA: Insufficient documentation

## 2013-09-17 MED ORDER — AMLODIPINE BESYLATE 5 MG PO TABS: 5.0000 mg | ORAL_TABLET | Freq: Every day | ORAL | Status: DC

## 2013-09-17 NOTE — Evaluation (Addendum)
Physical Therapy Evaluation  Patient Details  Name: [name] MRN: [id number] Date of Birth: 1940-07-22  Today's Date: 09/17/2013 Time: 0938-1020 PT Time Calculation (min): 42 min Charge:  evaluation             Visit#: 1 of 8  Re-eval: 10/17/13 Assessment Diagnosis: radicular leg pain  Authorization: medicare    Authorization Time Period:    Authorization Visit#: 1 of 8   Past Medical History:  Past Medical History  Diagnosis Date  . Colon polyps     adenomatous  . Diverticulosis   . GERD (gastroesophageal reflux disease)   . Hypercholesterolemia   . Hypertension   . Prostate cancer   . Arthritis    Past Surgical History:  Past Surgical History  Procedure Laterality Date  . Cholecystectomy  08/2007  . Removal of pituitary tumor  9/09  . Back surgery  1991  . Prostate surgery      Subjective Symptoms/Limitations Symptoms: Mr. Chap states that he picked up a chair and began having Rt groin pain that radiates into his LE.  He states the pain on the whole is getting better.  He has been referred to PT to decrease his pain and improve his quality of life Pertinent History: Hx of previous back surgery 1990; previous cervical disc without operation. How long can you sit comfortably?: driving a car is terrible; He has to pick up his leg to get into a car and then it begins to hurt immediately.   Normal chairs will increase pain after about a half hour.l  How long can you stand comfortably?: standing is better able to stand for about 20 minutes. How long can you walk comfortably?: Pt only walks to the mailbox and back.  Wants to begin walking again. Patient Stated Goals: decrease pain.   Pain Assessment Currently in Pain?: Yes Pain Score: 2  (worst 5/10 best 1/10) Pain Location: Groin Pain Orientation: Right Pain Type: Chronic pain Pain Radiating Towards: to toes Pain Onset: 1 to 4 weeks ago Pain Frequency: Constant Pain Relieving Factors: steriod pac  Effect of  Pain on Daily Activities: increases    Balance Screening  no falls  Prior Functioning  Prior Function Vocation: Retired Leisure: Hobbies-yes (Comment) Comments: walking, hunt , fish, farm    Sensation/Coordination/Flexibility/Functional Tests Flexibility Thomas: Positive Obers: Positive 90/90: Positive Functional Tests Functional Tests:  (38) Pt states his toes are numb on his Rt foot. Assessment RLE Strength Right Hip Flexion: 5/5 Right Hip Extension: 3+/5 Right Hip ABduction: 5/5 Right Knee Flexion: 4/5 Right Knee Extension: 5/5 Right Ankle Dorsiflexion: 3+/5 LLE Strength Left Hip Flexion: 5/5 Left Hip Extension: 4/5 Left Hip ABduction: 5/5 Left Knee Flexion: 5/5 Left Knee Extension: 5/5 Left Ankle Dorsiflexion: 4/5 Lumbar AROM Lumbar Flexion: decreased 30% reps increase pain  Lumbar Extension: wfl no change with reps. Lumbar - Right Side Bend: wfl with improved sx Lumbar - Left Side Bend: decreased 20% with increased pain  Lumbar - Right Rotation: decreased 15% Lumbar - Left Rotation: wnl Palpation Palpation:  (tight paraspinal mm)  Exercise/Treatments Mobility/Balance  Posture/Postural Control Posture/Postural Control: Postural limitations Postural Limitations: flat back    Stretches Active Hamstring Stretch: 3 reps;30 seconds Single Knee to Chest Stretch: 2 reps;30 seconds Standing Side Bend: 5 reps;Limitations Standing Side Bend Limitations: Rt only Standing Extension: 5 reps Prone on Elbows Stretch: 1 rep;60 seconds       Physical Therapy Assessment and Plan PT Assessment and Plan Clinical Impression Statement: Pt has  signs and sx of posterior derangement.  Pt is stiff in his Low back area.  Pt will benefit from skilled PT to eduacate on body mechanics, posture, stretching and stabilizing techniques.   Pt will benefit from skilled therapeutic intervention in order to improve on the following deficits: Decreased activity tolerance;Decreased  strength;Difficulty walking;Pain;Decreased range of motion;Increased fascial restricitons Rehab Potential: Good PT Frequency: Min 2X/week PT Duration: 4 weeks PT Treatment/Interventions: Therapeutic activities;Therapeutic exercise;Manual techniques PT Plan: review stretches; begin press up and stab exercises advancing to standing activities quickly    Goals Home Exercise Program Pt/caregiver will Perform Home Exercise Program: For increased ROM PT Goal: Perform Home Exercise Program - Progress: Goal set today PT Short Term Goals Time to Complete Short Term Goals: 2 weeks PT Short Term Goal 1: Pt to state pain is no greater than a 3/10  80% of the day PT Short Term Goal 2: Pt pain to be no further than his knee PT Short Term Goal 3: Pt to be able to verbalize the importance of body mechanics in back care.; PT Long Term Goals Time to Complete Long Term Goals: 4 weeks PT Long Term Goal 1: Pt pain to be no greater than a 1/10 PT Long Term Goal 2: no radicular pain Long Term Goal 3: Pt to be walking 20 minutes a day at least 3 times a week for better health habits. Long Term Goal 4: Pt to be able to demonstrate proper body mechanics when lifitng.  Problem List Patient Active Problem List   Diagnosis Date Noted  . Leg weakness, bilateral 09/17/2013  . Stiffness of joints, not elsewhere classified, multiple sites 09/17/2013  . Radicular leg pain 09/17/2013  . Loss of weight 11/27/2012  . Hypothyroidism 11/27/2012  . CONSTIPATION 07/01/2008  . RECTAL BLEEDING 07/01/2008  . PERSONAL HX COLONIC POLYPS 07/01/2008  . PITUITARY MACROADENOMA 06/27/2008  . HYPERCHOLESTEROLEMIA 06/27/2008  . HYPERTENSION 06/27/2008  . GERD 06/27/2008  . COLONIC POLYPS 07/03/2003  . DIVERTICULOSIS, COLON 07/03/2003    PT Plan of Care PT Home Exercise Plan: given  GP Functional Assessment Tool Used: foto Functional Limitation: Mobility: Walking and moving around Mobility: Walking and Moving Around  Current Status ([id number]): At least 60 percent but less than 80 percent impaired, limited or restricted Mobility: Walking and Moving Around Goal Status [phone]): At least 40 percent but less than 60 percent impaired, limited or restricted  Leeroy Cha 09/17/2013, 11:35 AM  Physician Documentation Your signature is required to indicate approval of the treatment plan as stated above.  Please sign and either send electronically or make a copy of this report for your files and return this physician signed original.   Please mark one 1.__approve of plan  2. ___approve of plan with the following conditions.   ______________________________                                                          _____________________ Physician Signature  Date  

## 2013-09-17 NOTE — Patient Instructions (Signed)
Stop Lisinopril  Use 1/2 tablet daily of the amlodipine BUT if pressures running high then go to 1 per day  Recheck here in 2 to 3 weeks  Call if rash not turning the corner by next week

## 2013-09-17 NOTE — Progress Notes (Signed)
   Subjective:    Patient ID: [name], male    DOB: 29-Dec-1940, 73 y.o.   MRN: [id number]  Rash This is a new problem. The current episode started 1 to 4 weeks ago. The problem has been gradually worsening since onset. The affected locations include the left arm, right upper leg, right lower leg, right arm, left upper leg and left lower leg. The rash is characterized by redness. He was exposed to nothing. Past treatments include oral steroids. The treatment provided no relief.  Patient has stopped taking prednisone for this because it was causing him to cramp.   Patient states this rash been going on over the past couple weeks he got to the point where he went ahead and saw dermatology they did a biopsy and told him he is allergic to one of his medicines most likely the blood pressure medicine   Review of Systems  Skin: Positive for rash.   denies any chest tightness shortness of breath wheezing or throat swelling    Objective:   Physical Exam Multiple small areas of rash that do not blanch almost have the look of purpura  Lungs clear heart regular blood pressure good       Assessment & Plan:  1. Rash and nonspecific skin eruption This patient has purpura which could be related to lisinopril. Stop lisinopril. Use amlodipine. Followup in 3 weeks. If the rash is not doing better within the next week we may have to consider other possibilities. May even need allergist consultation. Currently we have requested records from dermatology in a biopsy  2. Muscle cramp I believe this patient should get magnesium thyroid calcium and potassium check if his symptoms do not go away after stopping prednisone. If this has not been checked recently by his endocrinologist we will need to check this.  3. Unspecified hypothyroidism See above

## 2013-09-19 ENCOUNTER — Ambulatory Visit (HOSPITAL_COMMUNITY)
Admission: RE | Admit: 2013-09-19 | Discharge: 2013-09-19 | Disposition: A | Discharge status: Home / Self Care | Payer: Medicare Other | Source: Ambulatory Visit | Attending: Family Medicine | Admitting: Family Medicine

## 2013-09-19 NOTE — Progress Notes (Signed)
Physical Therapy Treatment Patient Details  Name: [name] MRN: [id number] Date of Birth: 1940-07-23  Today's Date: 09/19/2013 Time: 0932-1015 PT Time Calculation (min): 43 min Charges: Therex x 40'  Visit#: 2 of 8  Re-eval: 10/17/13  Authorization: medicare  Authorization Visit#: 2 of 8   Subjective: Symptoms/Limitations Symptoms: Pt states that his back is very sore from his exercises. Pain Assessment Currently in Pain?: Yes Pain Score: 1  Pain Location: Back Pain Orientation: Right Pain Radiating Towards: right ankle   Exercise/Treatments Stretches Active Hamstring Stretch: 3 reps;30 seconds Single Knee to Chest Stretch: 3 reps;30 seconds Piriformis Stretch: 2 reps;30 seconds;Limitations Piriformis Stretch Limitations: figure 4 Supine Ab Set: 10 reps;5 seconds Bridge: 10 reps;5 seconds Straight Leg Raise: 10 reps Sidelying Hip Abduction: 10 reps Prone  Straight Leg Raise: 10 reps Other Prone Lumbar Exercises: Press up x 5  Physical Therapy Assessment and Plan PT Assessment and Plan Clinical Impression Statement: Progressed core stabilization activities with minimal difficulty after initial cueing and demo. Pt displays significant tightness in bilateral hip rotators, right>left.  Began figure four piriformis stretch to decrease hip tightness and improve mobility. Educated pt on signs of centralization of pain well as rationale behind encouraging extension for posterior derangement. Pt verbalizes understanding. At end of session pt request to have his blood pressure tested secondary to dizziness and change in blood pressure medicine. Blood pressure was measured at 169/80. Encouraged pt to recheck blood pressure later as he has just finished exercising and if it does not decrease to contact MD. Pt will benefit from skilled therapeutic intervention in order to improve on the following deficits: Decreased activity tolerance;Decreased strength;Difficulty  walking;Pain;Decreased range of motion;Increased fascial restricitons Rehab Potential: Good PT Frequency: Min 2X/week PT Duration: 4 weeks PT Treatment/Interventions: Therapeutic activities;Therapeutic exercise;Manual techniques PT Plan: Continue to progress core stabilization and mobility while encouraging extension. Progress to standing activities quickly, per PT.     Problem List Patient Active Problem List   Diagnosis Date Noted  . Leg weakness, bilateral 09/17/2013  . Stiffness of joints, not elsewhere classified, multiple sites 09/17/2013  . Radicular leg pain 09/17/2013  . Loss of weight 11/27/2012  . Hypothyroidism 11/27/2012  . CONSTIPATION 07/01/2008  . RECTAL BLEEDING 07/01/2008  . PERSONAL HX COLONIC POLYPS 07/01/2008  . PITUITARY MACROADENOMA 06/27/2008  . HYPERCHOLESTEROLEMIA 06/27/2008  . HYPERTENSION 06/27/2008  . GERD 06/27/2008  . COLONIC POLYPS 07/03/2003  . DIVERTICULOSIS, COLON 07/03/2003    PT - End of Session Activity Tolerance: Patient tolerated treatment well General Behavior During Therapy: [facility] for tasks assessed/performed  GP Functional Assessment Tool Used: foto  Rachelle Hora, PTA 09/19/2013, 10:52 AM

## 2013-09-24 ENCOUNTER — Telehealth: Payer: Self-pay | Admitting: Family Medicine

## 2013-09-24 NOTE — Telephone Encounter (Signed)
Its either Tuesday or Thursday. NTC- when did his endocrinolgist last see him ? Did they do labs on him? It would be nice to have copies of these before visit. Talk with pt, schedule OV , also call his endocrinologist office to fax [place] these labs today

## 2013-09-24 NOTE — Telephone Encounter (Signed)
Transferred patient to front desk to schedule appointment. Patient states the l;ast time he saw his endocrinologist was sometime last year.

## 2013-09-24 NOTE — Telephone Encounter (Signed)
Patient called and asked for an appointment this week with Dr. Nicki Reaper for cramping in his hands. Also, he said that he is allergic to some of his medications that he has recently been prescribed. Please advise. We are down to same day slots.

## 2013-09-25 ENCOUNTER — Other Ambulatory Visit: Payer: Self-pay | Admitting: Family Medicine

## 2013-09-25 ENCOUNTER — Ambulatory Visit (INDEPENDENT_AMBULATORY_CARE_PROVIDER_SITE_OTHER): Payer: Medicare Other | Admitting: Family Medicine

## 2013-09-25 ENCOUNTER — Encounter: Payer: Self-pay | Admitting: Family Medicine

## 2013-09-25 VITALS — BP 136/72 | Ht 67.0 in | Wt 139.0 lb

## 2013-09-25 DIAGNOSIS — M2550 Pain in unspecified joint: Secondary | ICD-10-CM | POA: Diagnosis not present

## 2013-09-25 DIAGNOSIS — L270 Generalized skin eruption due to drugs and medicaments taken internally: Secondary | ICD-10-CM

## 2013-09-25 DIAGNOSIS — R21 Rash and other nonspecific skin eruption: Secondary | ICD-10-CM | POA: Diagnosis not present

## 2013-09-25 DIAGNOSIS — R252 Cramp and spasm: Secondary | ICD-10-CM | POA: Diagnosis not present

## 2013-09-25 DIAGNOSIS — I10 Essential (primary) hypertension: Secondary | ICD-10-CM

## 2013-09-25 LAB — CK: Total CK: 126 U/L (ref 7–232)

## 2013-09-25 LAB — BASIC METABOLIC PANEL
BUN: 20 mg/dL (ref 6–23)
CHLORIDE: 96 meq/L (ref 96–112)
CO2: 29 mEq/L (ref 19–32)
Calcium: 9.2 mg/dL (ref 8.4–10.5)
Creat: 0.86 mg/dL (ref 0.50–1.35)
Glucose, Bld: 81 mg/dL (ref 70–99)
POTASSIUM: 5.1 meq/L (ref 3.5–5.3)
Sodium: 133 mEq/L — ABNORMAL LOW (ref 135–145)

## 2013-09-25 LAB — SEDIMENTATION RATE: Sed Rate: 4 mm/hr (ref 0–16)

## 2013-09-25 LAB — TSH: TSH: 0.668 u[IU]/mL (ref 0.350–4.500)

## 2013-09-25 LAB — MAGNESIUM: MAGNESIUM: 2.1 mg/dL (ref 1.5–2.5)

## 2013-09-25 NOTE — Patient Instructions (Signed)
Do your blood work to look into the cramps  Also continue amlodipine  Don't use any anti inflammatory   No prilosec for now  I will set you up with allergist asap

## 2013-09-25 NOTE — Progress Notes (Signed)
   Subjective:    Patient ID: [name], male    DOB: 03-01-1941, 73 y.o.   MRN: [id number]  HPICramps in hands, feet, and legs. Started 3 -4 days ago. Drinking fluids, eating bananas, and mustard.   Please see discussion below. Patient with rash that is felt to be a drug allergy.  Cramping is been aggressive over the past several days had this off and on in the past he does have a history of blood pressure issues as well as thyroid and reflux  Review of Systems See above denies chest pain shortness of breath    Objective:   Physical Exam  Lungs clear heart regular pulse normal no spasms noted currently Patient has progressive rash on the arms chest abdomen and that is consistent with allergic reaction.     Assessment & Plan:  Muscle cramps-lab work ordered. Await the results.  Allergic rash-more than likely drug reaction we just recently started amlodipine. I don't feel that is causing at the rash was there before that medicine I believe it could have been lisinopril but he still has a rash ongoing we will stop Prilosec. Also stop all anti-inflammatories. In addition to this referral to allergist. Dermatologist essentially did a biopsy on his skin told him it was a drug reaction in pretty much did not help him after that

## 2013-09-26 ENCOUNTER — Telehealth: Payer: Self-pay

## 2013-09-26 NOTE — Telephone Encounter (Signed)
Patient states he started taking Amlodipine 5 mg daily yesterday. His blood pressure is now 180/101. He is concerned about this. Please advise.

## 2013-09-26 NOTE — Telephone Encounter (Signed)
Discussed with patient. Transferred to front to schedule appt for tomorrow with Dr. Nicki Reaper.

## 2013-09-26 NOTE — Telephone Encounter (Signed)
f u ov with scott tom for reassessment

## 2013-09-27 ENCOUNTER — Telehealth (HOSPITAL_COMMUNITY): Payer: Self-pay

## 2013-09-27 ENCOUNTER — Ambulatory Visit (INDEPENDENT_AMBULATORY_CARE_PROVIDER_SITE_OTHER): Payer: Medicare Other | Admitting: Family Medicine

## 2013-09-27 ENCOUNTER — Encounter: Payer: Self-pay | Admitting: Family Medicine

## 2013-09-27 VITALS — BP 190/110 | Ht 67.0 in | Wt 140.0 lb

## 2013-09-27 DIAGNOSIS — R252 Cramp and spasm: Secondary | ICD-10-CM | POA: Diagnosis not present

## 2013-09-27 DIAGNOSIS — I10 Essential (primary) hypertension: Secondary | ICD-10-CM | POA: Diagnosis not present

## 2013-09-27 NOTE — Telephone Encounter (Signed)
Date: 09/27/13  Patient has not been seen for PT since 09-19-13. Spoke with patient regarding making additional appts. Patient states he is having some other medical issues right now that he is trying to take care of. He will call us next week to let us know about continuing therapy.   Ailene Ravel, OTR/L,CBIS

## 2013-09-27 NOTE — Progress Notes (Signed)
   Subjective:    Patient ID: [name], male    DOB: 1940/06/18, 73 y.o.   MRN: [id number]  Hypertension This is a chronic problem.  Patient started Amlodipine 5mg  2 days ago. Blood pressure yesterday 180/101.   Trouble swallowing this am. Very temporary states in  Recheck rash on arms, legs, stomach, and neck. Started in February.   Review of Systems Denies fever chills rectal bleeding denies headaches denies chest pain shortness of breath    Objective:   Physical Exam  Lungs are clear hearts regular pulse normal skin warm dry blood pressure checked twice was good rash stable.      Assessment & Plan:  #1 patient was informed that if he starts having further trouble with swallowing he needs to let us know may need to do some studies he states that this is just temporary this morning he thinks it is because he was outside yesterday  Blood pressure is very good on 2 checks here I told him to continue amlodipine do not use lisinopril  Rash stable awaiting appointment with allergist  Cramps-there is no obvious findings on lab work will discuss with his endocrinologist if ongoing troubles he may end up needing to be seen by a specialist for this. Should be noted that he states it's a little better today

## 2013-09-28 ENCOUNTER — Telehealth: Payer: Self-pay

## 2013-09-28 NOTE — Telephone Encounter (Signed)
Notified patient Dr. Nicki Reaper did speak with his endocrinologist Dr Bubba Camp, she stated that we should also check a vitamin D level. She states that that can sometimes cause problems with cramping. Called [facility] and added Vitamin D level. Await these results.  Patient verbalized understanding.

## 2013-09-29 LAB — VITAMIN D 25 HYDROXY (VIT D DEFICIENCY, FRACTURES): Vit D, 25-Hydroxy: 38 ng/mL (ref 30–89)

## 2013-10-02 ENCOUNTER — Ambulatory Visit: Payer: Medicare Other | Admitting: Family Medicine

## 2013-10-07 ENCOUNTER — Other Ambulatory Visit: Payer: Self-pay | Admitting: Family Medicine

## 2013-10-07 DIAGNOSIS — R252 Cramp and spasm: Secondary | ICD-10-CM

## 2013-10-15 ENCOUNTER — Encounter: Payer: Self-pay | Admitting: Family Medicine

## 2013-10-15 ENCOUNTER — Telehealth: Payer: Self-pay | Admitting: Family Medicine

## 2013-10-15 ENCOUNTER — Ambulatory Visit (INDEPENDENT_AMBULATORY_CARE_PROVIDER_SITE_OTHER): Payer: Medicare Other | Admitting: Family Medicine

## 2013-10-15 VITALS — BP 130/70 | Temp 98.3°F | Ht 67.0 in | Wt 142.2 lb

## 2013-10-15 DIAGNOSIS — R0602 Shortness of breath: Secondary | ICD-10-CM

## 2013-10-15 DIAGNOSIS — R252 Cramp and spasm: Secondary | ICD-10-CM

## 2013-10-15 DIAGNOSIS — E232 Diabetes insipidus: Secondary | ICD-10-CM

## 2013-10-15 DIAGNOSIS — I6509 Occlusion and stenosis of unspecified vertebral artery: Secondary | ICD-10-CM

## 2013-10-15 LAB — BASIC METABOLIC PANEL
BUN: 14 mg/dL (ref 6–23)
CO2: 25 mEq/L (ref 19–32)
Calcium: 9.6 mg/dL (ref 8.4–10.5)
Chloride: 84 mEq/L — ABNORMAL LOW (ref 96–112)
Creat: 0.87 mg/dL (ref 0.50–1.35)
Glucose, Bld: 103 mg/dL — ABNORMAL HIGH (ref 70–99)
Potassium: 5.1 mEq/L (ref 3.5–5.3)
Sodium: 125 mEq/L — ABNORMAL LOW (ref 135–145)

## 2013-10-15 LAB — D-DIMER, QUANTITATIVE (NOT AT ARMC): D-Dimer, Quant: 0.51 ug/mL-FEU — ABNORMAL HIGH (ref 0.00–0.48)

## 2013-10-15 MED ORDER — DIAZEPAM 5 MG PO TABS: ORAL_TABLET | ORAL | Status: DC

## 2013-10-15 NOTE — Telephone Encounter (Signed)
Patient hasn't slept in 3 or 4 days, and very tired. He has been cramping and having trouble breathing. 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 He said he has extreme cramps.

## 2013-10-15 NOTE — Telephone Encounter (Signed)
Talked with patient. He is coming in today for shortness of breath.

## 2013-10-15 NOTE — Progress Notes (Signed)
   Subjective:    Patient ID: [name], male    DOB: August 03, 1940, 73 y.o.   MRN: [id number]  Shortness of Breath This is a new problem. The current episode started in the past 7 days. The problem occurs constantly. The problem has been unchanged. Nothing aggravates the symptoms. Associated symptoms comments: Cramping all over body. The patient has no known risk factors for DVT/PE. Treatments tried: Aleve. The treatment provided no relief.   Patient states that he has no other concerns at this time.  Patient has been having a lot of cramps lately keeps him up at night he states he is at his wits end he states he is cramping so bad he was even cramping in his abdomen and chest yesterday and as a result he states he can't breathe well. He states he's breathing much better today Denies fever. Review of Systems  Respiratory: Positive for shortness of breath.    shortness of breath is now resolved denies any chest pressure tightness or pain denies wheezing nausea vomiting diarrhea.     Objective:   Physical Exam Lungs are clear no crackles heart is regular pulses normal abdomen is soft extremities no edema tenderness in the right calf but no swelling in the right.       Assessment & Plan:  Shortness of breath I am concerned about this it has resolved but because of a lot of cramping he's been having he has not been as active we will check a d-dimer if elevated we'll set up for a CT angiomas of the chest to rule out pulmonary embolism.  Patient had an excessive urination I believe that he needs to go ahead and be seen again by his endocrinologist. DDAVP not doing as good as he should for this problem.  Cramps-I. spoke with neurology. We have done everything that we know possible to help this gentleman back he will be use cautioned drowsiness for home use only there could be underlying neuromuscular problem we will have neurology see him and evaluating for that. His thyroid function calcium and  potassium as well as magnesium has looked good. He was shown stretches he can do to help this out. 25 minutes spent with patient today.

## 2013-10-16 ENCOUNTER — Encounter (HOSPITAL_COMMUNITY): Payer: Self-pay

## 2013-10-16 ENCOUNTER — Telehealth: Payer: Self-pay | Admitting: *Deleted

## 2013-10-16 ENCOUNTER — Ambulatory Visit (HOSPITAL_COMMUNITY)
Admission: RE | Admit: 2013-10-16 | Discharge: 2013-10-16 | Disposition: A | Discharge status: Home / Self Care | Payer: Medicare Other | Source: Ambulatory Visit | Attending: Family Medicine | Admitting: Family Medicine

## 2013-10-16 DIAGNOSIS — M79609 Pain in unspecified limb: Secondary | ICD-10-CM | POA: Diagnosis not present

## 2013-10-16 DIAGNOSIS — R0602 Shortness of breath: Secondary | ICD-10-CM | POA: Diagnosis not present

## 2013-10-16 MED ORDER — IOHEXOL 350 MG/ML SOLN
100.0000 mL | Freq: Once | INTRAVENOUS | Status: AC | PRN
  Administered 2013-10-16: 100 mL via INTRAVENOUS

## 2013-10-16 NOTE — Progress Notes (Signed)
Spoke with radiology. I put the order in of carotid U/S. It is scheduled for tomorrow at 12:00pm. Pt also has his neurology appt tomorrow at 8:30am. Pt notified and verbalized understanding of test results and the additional order for tomorrow.

## 2013-10-16 NOTE — Telephone Encounter (Signed)
Calling patient to schedule appointment with Dr. Janann Colonel left message for him to return the call.

## 2013-10-16 NOTE — Progress Notes (Signed)
Orders are in as STAT. Pt notified to go to [facility] dpt. Pt verbalized understanding.

## 2013-10-16 NOTE — Telephone Encounter (Signed)
Patient returning call to schedule appointment for 10/17/13 at 9 am.

## 2013-10-17 ENCOUNTER — Other Ambulatory Visit: Payer: Self-pay | Admitting: *Deleted

## 2013-10-17 ENCOUNTER — Encounter (INDEPENDENT_AMBULATORY_CARE_PROVIDER_SITE_OTHER): Payer: Self-pay

## 2013-10-17 ENCOUNTER — Other Ambulatory Visit: Payer: Self-pay | Admitting: Neurology

## 2013-10-17 ENCOUNTER — Ambulatory Visit (INDEPENDENT_AMBULATORY_CARE_PROVIDER_SITE_OTHER): Payer: Medicare Other | Admitting: Neurology

## 2013-10-17 ENCOUNTER — Ambulatory Visit (HOSPITAL_COMMUNITY)
Admission: RE | Admit: 2013-10-17 | Discharge: 2013-10-17 | Disposition: A | Discharge status: Home / Self Care | Payer: Medicare Other | Source: Ambulatory Visit | Attending: Family Medicine | Admitting: Family Medicine

## 2013-10-17 ENCOUNTER — Other Ambulatory Visit (INDEPENDENT_AMBULATORY_CARE_PROVIDER_SITE_OTHER): Payer: Self-pay

## 2013-10-17 ENCOUNTER — Encounter: Payer: Self-pay | Admitting: Neurology

## 2013-10-17 VITALS — BP 127/78 | HR 112 | Ht 67.75 in | Wt 138.0 lb

## 2013-10-17 DIAGNOSIS — Z0289 Encounter for other administrative examinations: Secondary | ICD-10-CM

## 2013-10-17 DIAGNOSIS — R252 Cramp and spasm: Secondary | ICD-10-CM

## 2013-10-17 DIAGNOSIS — I6509 Occlusion and stenosis of unspecified vertebral artery: Secondary | ICD-10-CM

## 2013-10-17 DIAGNOSIS — I658 Occlusion and stenosis of other precerebral arteries: Secondary | ICD-10-CM | POA: Diagnosis not present

## 2013-10-17 DIAGNOSIS — I651 Occlusion and stenosis of basilar artery: Secondary | ICD-10-CM | POA: Insufficient documentation

## 2013-10-17 DIAGNOSIS — IMO0001 Reserved for inherently not codable concepts without codable children: Secondary | ICD-10-CM

## 2013-10-17 DIAGNOSIS — I6529 Occlusion and stenosis of unspecified carotid artery: Secondary | ICD-10-CM | POA: Diagnosis not present

## 2013-10-17 NOTE — Progress Notes (Signed)
[facility]    Provider:  Dr Janann Colonel Referring Provider: [name], MD Primary Care Physician:  Sallee Lange, MD  CC:  Muscle cramps  HPI:  [name] is a 73 y.o. male here as a referral from Dr. Wolfgang Phoenix for muscle cramps. He reports severe symptoms started around 2 weeks ago. Symptoms initially started distally (UE and LE) and then quickly worked its way proximally to eventually involve the whole body. He noted severe contraction of the muscles of the legs which would last all night long. Notes his legs would get better if he moved around. Symptoms occurred during the day time too but worse at night. Notes aching of his back and under his ribs. Notes a heating pad would help relieve the pain somewhat. He notes seeing his muscles twitching when these episodes are occuring. Symptoms progressed to the point of having difficulty swallowing. Notes some difficulty walking due to the pain.   He has not started any new medication prior to these events. No recent illnesses, fevers. He notes multiple tick bites in the past 1-2 months.   Recently developed severe pruritus and a generalized rash, told by dermatology that it was likely a medication reaction. For the past few years has noted some occasional aching/cramping of the muscles of his hands. Would go away with hydration. Currently takes desmopressin after removal of  pituitary gland.   Reports he has been taking valium 2.5 to 5 mg nightly for the past 2 nights and this has helped.   Review of Systems: Out of a complete 14 system review, the patient complains of only the following symptoms, and all other reviewed systems are negative. +blurred vision, shortness of breath, easy bruising, memory loss, headaches, numbness, weakness  History   Social History  . Marital Status: Married    Spouse Name: N/A    Number of Children: 2  . Years of Education: N/A   Occupational History  . retired    Social History Main  Topics  . Smoking status: Former Research scientist (life sciences)  . Smokeless tobacco: Former Systems developer    Quit date: 11/03/1977  . Alcohol Use: Yes     Comment: occ  . Drug Use: No  . Sexual Activity: Not on file   Other Topics Concern  . Not on file   Social History Narrative   Married with 2 children   Right handed   12+   2-3 cups daily    Family History  Problem Relation Age of Onset  . Clotting disorder Father   . Heart disease Father   . Heart attack Father   . Colon cancer Neg Hx   . Diabetes Neg Hx     Past Medical History  Diagnosis Date  . Colon polyps     adenomatous  . Diverticulosis   . GERD (gastroesophageal reflux disease)   . Hypercholesterolemia   . Hypertension   . Prostate cancer   . Arthritis     Past Surgical History  Procedure Laterality Date  . Cholecystectomy  08/2007  . Removal of pituitary tumor  9/09  . Back surgery  1991  . Prostate surgery      Current Outpatient Prescriptions  Medication Sig Dispense Refill  . amLODipine (NORVASC) 5 MG tablet Take 1 tablet (5 mg total) by mouth daily.  30 tablet  11  . cyclobenzaprine (FLEXERIL) 10 MG tablet Take 10 mg by mouth 3 (three) times daily as needed for muscle spasms.      Marland Kitchen  desmopressin (DDAVP) 0.1 MG tablet Take 0.1 mg by mouth 2 (two) times daily.      . diazepam (VALIUM) 5 MG tablet One half to one tablet every 8 hrs as needed for cramps  30 tablet  0  . halobetasol (ULTRAVATE) 0.05 % cream Apply topically 2 (two) times daily.      Marland Kitchen levothyroxine (SYNTHROID, LEVOTHROID) 88 MCG tablet Take 88 mcg by mouth every other day.      Marland Kitchen omeprazole (PRILOSEC) 20 MG capsule Take 1 capsule (20 mg total) by mouth daily.  90 capsule  1  . triamcinolone cream (KENALOG) 0.1 %        No current facility-administered medications for this visit.    Allergies as of 10/17/2013 - Review Complete 10/17/2013  Allergen Reaction Noted  . Codeine    . Morphine    . Tramadol  08/31/2013    Vitals: BP 127/78  Pulse 112  Ht 5'  7.75" (1.721 m)  Wt 138 lb (62.596 kg)  BMI 21.13 kg/m2 Last Weight:  Wt Readings from Last 1 Encounters:  10/17/13 138 lb (62.596 kg)   Last Height:   Ht Readings from Last 1 Encounters:  10/17/13 5' 7.75" (1.721 m)     Physical exam: Exam: Gen: NAD, conversant Eyes: anicteric sclerae, moist conjunctivae HENT: Atraumatic, oropharynx clear Neck: Trachea midline; supple,  Lungs: CTA, no wheezing, rales, rhonic                          CV: RRR, no MRG Abdomen: Soft, non-tender;  Extremities: No peripheral edema  Skin: Normal temperature Psych: Appropriate affect, pleasant  Neuro: MS: AA&Ox3, appropriately interactive, normal affect   Attention: WORLD backwards  Speech: fluent w/o paraphasic error  Memory: good recent and remote recall  CN: PERRL, EOMI no nystagmus, no ptosis, sensation intact to LT V1-V3 bilat, decreased R NLF, no weakness, hearing grossly intact, palate elevates symmetrically, shoulder shrug 5/5 bilat,  tongue protrudes midline, no fasiculations noted.  Motor: normal bulk and tone, no fasciculations noted Strength: 5/5  In all extremities  Coord: rapid alternating and point-to-point (FNF, HTS) movements intact.  Reflexes: symmetrical, bilat downgoing toes  Sens: LT intact in all extremities  Gait: posture, stance, stride and arm-swing normal. Tandem gait intact. Able to walk on heels and toes. Romberg absent.   Assessment:  After physical and neurologic examination, review of laboratory studies, imaging, neurophysiology testing and pre-existing records, assessment will be reviewed on the problem list.  Plan:  Treatment plan and additional workup will be reviewed under Problem List.  1)Muslce cramps/myalgia  72y/o gentleman presenting for initial evaluation of acute onset muscle cramps that started distally and quickly became generalized. Unclear etiology. Would question possible infectious etiology vs underlying neuromuscular disorder. With  history of tick bites will check for Lyme and [facility] spotted fever, will check ESR, anti-GAD (Stiff persons syndrome), ANA. Will refer for EMG/NCS. Continue valium as needed for symptomatic relief.   Jim Like, DO  [facility] [address] [place], [place] [address]  Phone [phone] Fax [phone]

## 2013-10-17 NOTE — Patient Instructions (Signed)
Overall you are doing fairly well but I do want to suggest a few things today:   Remember to drink plenty of fluid, eat healthy meals and do not skip any meals. Try to eat protein with a every meal and eat a healthy snack such as fruit or nuts in between meals. Try to keep a regular sleep-wake schedule and try to exercise daily, particularly in the form of walking, 20-30 minutes a day, if you can.   As far as diagnostic testing:  1)Please have some blood work completed today 2)Please schedule an EMG/Nerve conduction study with Dr Jannifer Franklin  Continue to use the valium as needed for symptomatic relief.   We will follow up once the above testing is completed. Please call us with any interim questions, concerns, problems, updates or refill requests.   My clinical assistant and will answer any of your questions and relay your messages to me and also relay most of my messages to you.   Our phone number is [phone]. We also have an after hours call service for urgent matters and there is a physician on-call for urgent questions. For any emergencies you know to call 911 or go to the nearest emergency room

## 2013-10-19 ENCOUNTER — Other Ambulatory Visit: Payer: Self-pay | Admitting: Neurology

## 2013-10-19 ENCOUNTER — Telehealth: Payer: Self-pay | Admitting: Neurology

## 2013-10-19 LAB — ANA W/REFLEX IF POSITIVE
Anti Nuclear Antibody(ANA): POSITIVE — AB
Centromere Ab Screen: <0.2 AI (ref 0.0–0.9)
Chromatin Ab SerPl-aCnc: <0.2 AI (ref 0.0–0.9)
DSDNA AB: 2 [IU]/mL (ref 0–9)
ENA RNP Ab: 1.6 AI — ABNORMAL HIGH (ref 0.0–0.9)
ENA SM Ab Ser-aCnc: <0.2 AI (ref 0.0–0.9)
ENA SSA (RO) Ab: 0.4 AI (ref 0.0–0.9)
ENA SSB (LA) Ab: <0.2 AI (ref 0.0–0.9)
Scleroderma SCL-70: <0.2 AI (ref 0.0–0.9)

## 2013-10-19 LAB — ROCKY MTN SPOTTED FVR ABS PNL(IGG+IGM)
RMSF IGG: POSITIVE — AB
RMSF IgM: 0.64 index (ref 0.00–0.89)

## 2013-10-19 LAB — SEDIMENTATION RATE: Sed Rate: 44 mm/hr — ABNORMAL HIGH (ref 0–30)

## 2013-10-19 LAB — RMSF, IGG, IFA

## 2013-10-19 LAB — GAD-65 AUTOANTIBODY

## 2013-10-19 LAB — LYME, TOTAL AB TEST/REFLEX

## 2013-10-19 MED ORDER — DOXYCYCLINE HYCLATE 100 MG PO TABS: 100.0000 mg | ORAL_TABLET | Freq: Two times a day (BID) | ORAL | Status: DC

## 2013-10-19 NOTE — Progress Notes (Signed)
Quick Note:  Spoke with patient to see if he could come in on 10/22/13 for his EMG, patient states that he is out of town, I told him to call back and r/s appointment when he returns. ______

## 2013-10-19 NOTE — Telephone Encounter (Signed)
Called patient to discuss recent blood work results with IgG + for RMSF, no answer so message was left. Unclear if this may represent a prior treated infection but will prescribe a ten day course of doxycycline. Prescription sent to pharmacy.

## 2013-10-23 DIAGNOSIS — E038 Other specified hypothyroidism: Secondary | ICD-10-CM | POA: Diagnosis not present

## 2013-10-23 DIAGNOSIS — E232 Diabetes insipidus: Secondary | ICD-10-CM | POA: Diagnosis not present

## 2013-10-23 DIAGNOSIS — D443 Neoplasm of uncertain behavior of pituitary gland: Secondary | ICD-10-CM | POA: Diagnosis not present

## 2013-10-23 DIAGNOSIS — D444 Neoplasm of uncertain behavior of craniopharyngeal duct: Secondary | ICD-10-CM | POA: Diagnosis not present

## 2013-10-24 ENCOUNTER — Telehealth: Payer: Self-pay | Admitting: Family Medicine

## 2013-10-24 NOTE — Telephone Encounter (Signed)
Dr. Sherrell Puller, the neurologist, said that he had a tick infection and thought that was his problem. He was put on antibiotics, and patient wants to know if he needs to come in our office to follow up?

## 2013-10-24 NOTE — Telephone Encounter (Signed)
Pt saw Dr. Sherrell Puller on 06/10 and was put on doxy BID for 10 days. Pt states he is feeling better but he was feeling better before starting the antibiotics.

## 2013-10-24 NOTE — Telephone Encounter (Signed)
Please let the patient know that I am glad he is doing better. He does not have to followup with [place] except for his regular visits. Certainly if he starts having other trouble he should let us know.

## 2013-10-25 NOTE — Telephone Encounter (Signed)
Patient notified and verbalized understanding. 

## 2013-11-12 ENCOUNTER — Telehealth: Payer: Self-pay | Admitting: Family Medicine

## 2013-11-12 ENCOUNTER — Encounter: Payer: Medicare Other | Admitting: Neurology

## 2013-11-12 NOTE — Telephone Encounter (Signed)
Scott to see so he can "stay in the loop"

## 2013-11-12 NOTE — Telephone Encounter (Signed)
Patient is requesting a referral to Infectious Disease in [place]. He said he has spoke with Dr. Derek Mound office, but they require a referral. He said he went to a neurologist for all the issues he was having and found out that it was a tick infection. He has been through the rounds of antibiotics as directed, but still has a rash. He has spoke with several people and they have suggested seeing infectious disease.

## 2013-11-12 NOTE — Telephone Encounter (Signed)
His rash may or may not be due to a tick illness, the fact that he was treated with antibiotics and still has the rash points toward dermatologic cause , I can set him up with infectious disease but he may well benefit from seeing dermatologist. It's possible he may have to see both but if his symptoms are essentially having a rash at this point then seeing dermatology as next visit makes most sense. See what the patient has to say please

## 2013-11-15 NOTE — Telephone Encounter (Signed)
Pt already went to a dermatologist. They referred him back to you. He wants to know if there is some type of blood test he can get done to see if the antibiotics worked.

## 2013-11-19 ENCOUNTER — Telehealth: Payer: Self-pay | Admitting: Neurology

## 2013-11-19 DIAGNOSIS — R21 Rash and other nonspecific skin eruption: Secondary | ICD-10-CM

## 2013-11-19 DIAGNOSIS — A770 Spotted fever due to Rickettsia rickettsii: Secondary | ICD-10-CM

## 2013-11-19 NOTE — Telephone Encounter (Signed)
No further recommendations on the rash. Will setup ID consult. Let patient know. -VRP

## 2013-11-19 NOTE — Telephone Encounter (Signed)
Spoke with patient and he said that he still have the itchy rash on stomach and arms, has finished the antibiotics. Patient said that he has postponed the NCV/EMG until rash goes away.

## 2013-11-19 NOTE — Telephone Encounter (Signed)
Patient saw Dr. Janann Colonel 2 weeks ago and had a blood test.  The blood test revealed that he had a tick bite infection so Dr. Janann Colonel prescribed an antibiotic. Patient has finished antibiotic and still has a rash--please call and advise--thank you.

## 2013-11-20 NOTE — Telephone Encounter (Signed)
Referral for infectious disease for patient

## 2013-11-20 NOTE — Telephone Encounter (Signed)
Informed patient per Dr Gladstone Lighter note below, he verbalized understanding

## 2013-11-22 ENCOUNTER — Other Ambulatory Visit: Payer: Self-pay | Admitting: Family Medicine

## 2013-11-22 DIAGNOSIS — R21 Rash and other nonspecific skin eruption: Secondary | ICD-10-CM

## 2013-11-22 NOTE — Telephone Encounter (Signed)
Patient notified and verbalized understanding. 

## 2013-11-22 NOTE — Telephone Encounter (Signed)
With the complexity as well as the ongoing nature of this ,referral to infectious disease will be put in to see if this rash is due to any ongoing illness or infection. If the specialist says it isn't then next step will be follow up to discuss further and to see where to go from there.referral put in.

## 2013-11-28 ENCOUNTER — Telehealth: Payer: Self-pay | Admitting: Family Medicine

## 2013-11-28 DIAGNOSIS — Z79899 Other long term (current) drug therapy: Secondary | ICD-10-CM

## 2013-11-28 NOTE — Telephone Encounter (Signed)
Pt wants to know since he does not feel any better and  Another rash is popping up now on his left arm, as well  As tired.[name]   He was issued Doxycycline from Dr Janann Colonel  For his tic born disease, wants to know if he needs  A refill on this?   [place] also told him yesterday he should have  Been checked for liver damage.. Pt not sure this has been done  Can we let the pt know or run this test

## 2013-11-28 NOTE — Telephone Encounter (Signed)
#  1 with the ongoing symptoms refill doxycycline 100 mg twice a day take with food in a tall glass of water for 14 days. #2 in reviewing previous notes it was stated that this could be related to his blood pressure medicine. There is a less than 1% chance that this is due to his blood pressure medicine. But to eliminate that possibility I would recommend stopping amlodipine. I would recommend starting losartan 50 mg, 1 per day, this is a low-dose, #30, 4 refills. #3 I would recommend the patient followup with [place] in one month to recheck. If the rash is due to the medication it will take several weeks to resolve. #4-please check liver profile. Finally we had put in a referral for him to see infectious disease asked the patient if he has heard anything from the infectious disease office at this point?

## 2013-11-28 NOTE — Telephone Encounter (Addendum)
Patient was seen by dermatology, allergy doctor, neurology and the sent to infectious disease for his rash and body aches without any improvement.

## 2013-11-29 DIAGNOSIS — Z79899 Other long term (current) drug therapy: Secondary | ICD-10-CM | POA: Diagnosis not present

## 2013-11-29 MED ORDER — DOXYCYCLINE HYCLATE 100 MG PO TABS: 100.0000 mg | ORAL_TABLET | Freq: Two times a day (BID) | ORAL | Status: DC

## 2013-11-29 MED ORDER — LOSARTAN POTASSIUM 50 MG PO TABS: 50.0000 mg | ORAL_TABLET | Freq: Every day | ORAL | Status: DC

## 2013-11-29 NOTE — Telephone Encounter (Signed)
Patient was notified the information listed below. Patient was told medication was sent to pharmacy and bloodwork was ordered. Patient will call back and schedule recheck in one month. Patient states he has no heard anything about his appointment with infectious disease. He would like to know the status of this referral.

## 2013-11-29 NOTE — Telephone Encounter (Signed)
Brendale, please check into this. This patient is seeing dermatology he is also seen rheumatology and neurology. Neurology did RMSF testing which was positive they treated him with doxycycline. They told him that his rash could be related to the disease. He has a lot of symptoms of body aches and not feeling good along with rash referral to infectious disease was put in several days ago. Please check on the status and inform the patient.

## 2013-12-01 LAB — HEPATIC FUNCTION PANEL
ALT: 20 U/L (ref 0–53)
AST: 22 U/L (ref 0–37)
Albumin: 4.1 g/dL (ref 3.5–5.2)
Alkaline Phosphatase: 63 U/L (ref 39–117)
BILIRUBIN INDIRECT: 1 mg/dL (ref 0.2–1.2)
Bilirubin, Direct: 0.2 mg/dL (ref 0.0–0.3)
Total Bilirubin: 1.2 mg/dL (ref 0.2–1.2)
Total Protein: 7.1 g/dL (ref 6.0–8.3)

## 2013-12-03 NOTE — Telephone Encounter (Signed)
Referral in system states denied, [facility] [phone]) for inf dis ? What's going on

## 2013-12-03 NOTE — Telephone Encounter (Signed)
Infectious Disease denied referral due to pt not actually having rocky mtn spotted fever, may want to refer elsewhere.  Dermatology? Allergy?  Please advise, spoke with patient, updated him on referral, explained that I would let him know more when I have more information   He asked if he should continue the antibiotics you wrote for him?  Please advise

## 2013-12-06 ENCOUNTER — Telehealth: Payer: Self-pay | Admitting: Neurology

## 2013-12-06 NOTE — Telephone Encounter (Signed)
Patient called to check status of referral to [facility].  He hadn't heard anything regarding an appt with [facility].  Please call anytime and if not available leave message.  Thanks

## 2013-12-09 NOTE — Telephone Encounter (Signed)
I would recommend the patient continue the medication for a full 14 days. Also recommend that the patient followup with [place] in approximately 2 weeks. We will re\re look at his rash as well as discuss what other options are available. This may be a situation where we will have to [facility] for [facility] to be able to thoroughly addressed his problem. There are no other specialists and [place] who can handle this.

## 2013-12-10 NOTE — Telephone Encounter (Signed)
Patient stated he is finally feeling better today for the first time in a long time. Patient scheduled follow up office visit with Dr. Nicki Reaper.

## 2013-12-11 ENCOUNTER — Telehealth: Payer: Self-pay | Admitting: *Deleted

## 2013-12-11 NOTE — Telephone Encounter (Signed)
The referral was denied at [facility], they do not see patients for rashes and RMSF, waiting to hear back from [facility] on who we need to refer the patient to for the RMSF.

## 2013-12-11 NOTE — Telephone Encounter (Signed)
Infectious disease was called to see why the patient's referral was denied.  Was advised that he would need to see a dermatologist for the rash and checking with the office manager on who would he see for the [facility] mountain spotted fever,  Awaiting a call back.

## 2013-12-12 NOTE — Telephone Encounter (Signed)
Infectious Disease called and per the note ,Dr. Wolfgang Phoenix is seeing the patient back on Monday and will re-eval the rash and possibly send him to a [facility].  Patient was notifeid.

## 2013-12-17 ENCOUNTER — Ambulatory Visit (INDEPENDENT_AMBULATORY_CARE_PROVIDER_SITE_OTHER): Payer: Medicare Other | Admitting: Family Medicine

## 2013-12-17 ENCOUNTER — Encounter: Payer: Self-pay | Admitting: Family Medicine

## 2013-12-17 VITALS — BP 122/80 | Ht 67.0 in | Wt 145.0 lb

## 2013-12-17 DIAGNOSIS — I10 Essential (primary) hypertension: Secondary | ICD-10-CM

## 2013-12-17 DIAGNOSIS — R21 Rash and other nonspecific skin eruption: Secondary | ICD-10-CM

## 2013-12-17 DIAGNOSIS — Z8546 Personal history of malignant neoplasm of prostate: Secondary | ICD-10-CM | POA: Diagnosis not present

## 2013-12-17 DIAGNOSIS — M5430 Sciatica, unspecified side: Secondary | ICD-10-CM | POA: Diagnosis not present

## 2013-12-17 DIAGNOSIS — M5431 Sciatica, right side: Secondary | ICD-10-CM

## 2013-12-17 DIAGNOSIS — I6509 Occlusion and stenosis of unspecified vertebral artery: Secondary | ICD-10-CM

## 2013-12-17 MED ORDER — HALOBETASOL PROPIONATE 0.05 % EX CREA: TOPICAL_CREAM | Freq: Two times a day (BID) | CUTANEOUS | Status: DC

## 2013-12-17 MED ORDER — OMEPRAZOLE 20 MG PO CPDR: 20.0000 mg | DELAYED_RELEASE_CAPSULE | Freq: Every day | ORAL | Status: DC

## 2013-12-17 NOTE — Progress Notes (Signed)
   Subjective:    Patient ID: [name], male    DOB: 09/05/1940, 73 y.o.   MRN: [id number]  HPI Patient arrives to follow up on rash- States he is still on antibiotics but they make him feel bad. Patient denies any chest tightness pressure pain sweats chills fevers. States his overall energy level doing okay. See previous bone messages from neurology and [place] Review of Systems See above    Objective:   Physical Exam  Minimal rash it's actually getting better lungs are clear hearts regular pulse normal BP good extremities no edema      Assessment & Plan:  I doubt there is any persistent tick related illness stop doxycycline. Check R. and SF IgG and IgM in 6-8 weeks  If the rash gradually goes away over the next 6 weeks no need to see [facility] if it is not getting better then the next step would be referral to [facility]  He is going to try the new blood pressure medicine in place of amlodipine see if that help her rest the rash totally go away

## 2013-12-31 ENCOUNTER — Telehealth: Payer: Self-pay | Admitting: Family Medicine

## 2013-12-31 MED ORDER — OMEPRAZOLE 20 MG PO CPDR: 20.0000 mg | DELAYED_RELEASE_CAPSULE | Freq: Every day | ORAL | Status: DC

## 2013-12-31 NOTE — Telephone Encounter (Signed)
Medication sent to pharmacy. Left message on voicemail notifying patient.  

## 2013-12-31 NOTE — Telephone Encounter (Signed)
omeprazole (PRILOSEC) 20 MG capsule  Pt states this med was to be [facility] was sent to [facility] can we redirect it

## 2014-01-29 DIAGNOSIS — R21 Rash and other nonspecific skin eruption: Secondary | ICD-10-CM | POA: Diagnosis not present

## 2014-01-31 LAB — ROCKY MTN SPOTTED FVR ABS PNL(IGG+IGM)
RMSF IGG: 2.29 IV — AB
RMSF IGM: 0.81 IV

## 2014-02-01 ENCOUNTER — Ambulatory Visit (INDEPENDENT_AMBULATORY_CARE_PROVIDER_SITE_OTHER): Payer: Medicare Other | Admitting: Family Medicine

## 2014-02-01 ENCOUNTER — Encounter: Payer: Self-pay | Admitting: Family Medicine

## 2014-02-01 VITALS — BP 132/76 | Ht 67.0 in | Wt 151.0 lb

## 2014-02-01 DIAGNOSIS — I10 Essential (primary) hypertension: Secondary | ICD-10-CM

## 2014-02-01 DIAGNOSIS — Z23 Encounter for immunization: Secondary | ICD-10-CM

## 2014-02-01 DIAGNOSIS — I6509 Occlusion and stenosis of unspecified vertebral artery: Secondary | ICD-10-CM

## 2014-02-01 DIAGNOSIS — M2560 Stiffness of unspecified joint, not elsewhere classified: Secondary | ICD-10-CM | POA: Diagnosis not present

## 2014-02-01 DIAGNOSIS — R51 Headache: Secondary | ICD-10-CM | POA: Diagnosis not present

## 2014-02-01 NOTE — Progress Notes (Signed)
   Subjective:    Patient ID: [name], male    DOB: Aug 23, 1940, 73 y.o.   MRN: [id number]  Hypertension This is a chronic problem. Associated symptoms include headaches. Treatments tried: losartan 50mg . Compliance problems include exercise.    Wanting results of rocky mtn spotted fever. bloodwork done on 01/29/14. Having cramps in hands and both sides of ribs.   Headache last four or fie days  161 over 92  Hurts to the front of the head and temporal  Cramps on hands and feet again Patient was concerned that his blood pressures were significantly elevated.  Is had a lot of challenges with tick bites earlier in the year.  Had blood work several months ago regarding tickborne illness and had repeat blood work just a couple days ago.  Review of Systems  Neurological: Positive for headaches.   No vomiting no diarrhea no    Objective:   Physical Exam  Alert blood pressure excellent on repeat 132/80. Both arms. HEENT normal neck supple. Lungs clear. Heart rare rhythm. Legs feet ankles without edema      Assessment & Plan:  Impression 1 hypertension actually good control. #2 headaches very nonspecific and actually somewhat improved #3 history of tick bite this summer with rather uncommon symptoms. Was encouraged to get a followup titer for further instruction plan maintain same blood pressure medication. Symptomatic care discussed. Regarding headaches. Recommend patient's usual family doctor Dr. Nicki Reaper sees the results and make recommendations as far as tickborne illness. WSL no antibiotics today.

## 2014-02-05 ENCOUNTER — Other Ambulatory Visit: Payer: Medicare Other

## 2014-03-07 ENCOUNTER — Encounter: Payer: Self-pay | Admitting: Family Medicine

## 2014-03-07 ENCOUNTER — Ambulatory Visit (INDEPENDENT_AMBULATORY_CARE_PROVIDER_SITE_OTHER): Payer: Medicare Other | Admitting: Family Medicine

## 2014-03-07 VITALS — BP 146/76 | Ht 67.0 in | Wt 147.2 lb

## 2014-03-07 DIAGNOSIS — I6509 Occlusion and stenosis of unspecified vertebral artery: Secondary | ICD-10-CM | POA: Diagnosis not present

## 2014-03-07 DIAGNOSIS — I10 Essential (primary) hypertension: Secondary | ICD-10-CM

## 2014-03-07 DIAGNOSIS — R21 Rash and other nonspecific skin eruption: Secondary | ICD-10-CM

## 2014-03-07 MED ORDER — OMEPRAZOLE 20 MG PO CPDR: 20.0000 mg | DELAYED_RELEASE_CAPSULE | Freq: Every day | ORAL | Status: DC

## 2014-03-07 NOTE — Patient Instructions (Signed)
DASH Eating Plan °DASH stands for "Dietary Approaches to Stop Hypertension." The DASH eating plan is a healthy eating plan that has been shown to reduce high blood pressure (hypertension). Additional health benefits may include reducing the risk of type 2 diabetes mellitus, heart disease, and stroke. The DASH eating plan may also help with weight loss. °WHAT DO I NEED TO KNOW ABOUT THE DASH EATING PLAN? °For the DASH eating plan, you will follow these general guidelines: °· Choose foods with a percent daily value for sodium of less than 5% (as listed on the food label). °· Use salt-free seasonings or herbs instead of table salt or sea salt. °· Check with your health care provider or pharmacist before using salt substitutes. °· Eat lower-sodium products, often labeled as "lower sodium" or "no salt added." °· Eat fresh foods. °· Eat more vegetables, fruits, and low-fat dairy products. °· Choose whole grains. Look for the word "whole" as the first word in the ingredient list. °· Choose fish and skinless chicken or turkey more often than red meat. Limit fish, poultry, and meat to 6 oz (170 g) each day. °· Limit sweets, desserts, sugars, and sugary drinks. °· Choose heart-healthy fats. °· Limit cheese to 1 oz (28 g) per day. °· Eat more home-cooked food and less restaurant, buffet, and fast food. °· Limit fried foods. °· Cook foods using methods other than frying. °· Limit canned vegetables. If you do use them, rinse them well to decrease the sodium. °· When eating at a restaurant, ask that your food be prepared with less salt, or no salt if possible. °WHAT FOODS CAN I EAT? °Seek help from a dietitian for individual calorie needs. °Grains °Whole grain or whole wheat bread. Brown rice. Whole grain or whole wheat pasta. Quinoa, bulgur, and whole grain cereals. Low-sodium cereals. Corn or whole wheat flour tortillas. Whole grain cornbread. Whole grain crackers. Low-sodium crackers. °Vegetables °Fresh or frozen vegetables  (raw, steamed, roasted, or grilled). Low-sodium or reduced-sodium tomato and vegetable juices. Low-sodium or reduced-sodium tomato sauce and paste. Low-sodium or reduced-sodium canned vegetables.  °Fruits °All fresh, canned (in natural juice), or frozen fruits. °Meat and Other Protein Products °Ground beef (85% or leaner), grass-fed beef, or beef trimmed of fat. Skinless chicken or turkey. Ground chicken or turkey. Pork trimmed of fat. All fish and seafood. Eggs. Dried beans, peas, or lentils. Unsalted nuts and seeds. Unsalted canned beans. °Dairy °Low-fat dairy products, such as skim or 1% milk, 2% or reduced-fat cheeses, low-fat ricotta or cottage cheese, or plain low-fat yogurt. Low-sodium or reduced-sodium cheeses. °Fats and Oils °Tub margarines without trans fats. Light or reduced-fat mayonnaise and salad dressings (reduced sodium). Avocado. Safflower, olive, or canola oils. Natural peanut or almond butter. °Other °Unsalted popcorn and pretzels. °The items listed above may not be a complete list of recommended foods or beverages. Contact your dietitian for more options. °WHAT FOODS ARE NOT RECOMMENDED? °Grains °White bread. White pasta. White rice. Refined cornbread. Bagels and croissants. Crackers that contain trans fat. °Vegetables °Creamed or fried vegetables. Vegetables in a cheese sauce. Regular canned vegetables. Regular canned tomato sauce and paste. Regular tomato and vegetable juices. °Fruits °Dried fruits. Canned fruit in light or heavy syrup. Fruit juice. °Meat and Other Protein Products °Fatty cuts of meat. Ribs, chicken wings, bacon, sausage, bologna, salami, chitterlings, fatback, hot dogs, bratwurst, and packaged luncheon meats. Salted nuts and seeds. Canned beans with salt. °Dairy °Whole or 2% milk, cream, half-and-half, and cream cheese. Whole-fat or sweetened yogurt. Full-fat   cheeses or blue cheese. Nondairy creamers and whipped toppings. Processed cheese, cheese spreads, or cheese  curds. °Condiments °Onion and garlic salt, seasoned salt, table salt, and sea salt. Canned and packaged gravies. Worcestershire sauce. Tartar sauce. Barbecue sauce. Teriyaki sauce. Soy sauce, including reduced sodium. Steak sauce. Fish sauce. Oyster sauce. Cocktail sauce. Horseradish. Ketchup and mustard. Meat flavorings and tenderizers. Bouillon cubes. Hot sauce. Tabasco sauce. Marinades. Taco seasonings. Relishes. °Fats and Oils °Butter, stick margarine, lard, shortening, ghee, and bacon fat. Coconut, palm kernel, or palm oils. Regular salad dressings. °Other °Pickles and olives. Salted popcorn and pretzels. °The items listed above may not be a complete list of foods and beverages to avoid. Contact your dietitian for more information. °WHERE CAN I FIND MORE INFORMATION? °National Heart, Lung, and Blood Institute: www.nhlbi.nih.gov/health/health-topics/topics/dash/ °Document Released: 04/15/2011 Document Revised: 09/10/2013 Document Reviewed: 02/28/2013 °ExitCare® Patient Information ©2015 [employer]. This information is not intended to replace advice given to you by your health care provider. Make sure you discuss any questions you have with your health care provider. ° °

## 2014-03-07 NOTE — Progress Notes (Addendum)
   Subjective:    Patient ID: [name], male    DOB: 1940-06-04, 73 y.o.   MRN: [id number]  Hypertension This is a chronic problem. The current episode started more than 1 year ago. Risk factors for coronary artery disease include male gender. Treatments tried: cozaar. There are no compliance problems.    He has checked his blood pressure multiple times with his cuff and at times entirely other times it looks good he states he never sits for several minutes before checking it just immediately sits down checks   Review of Systems Denies chest tightness present pain shortness of breath fever chills    Objective:   Physical Exam Lungs clear heart regular neck no masses blood pressure checked 146/76       Assessment & Plan:  HTN decent control he is a check his blood pressure multiple times over the next couple weeks and is back to [place] I told him the proper way to check if his readings are still elevated we will have to increase the medicine if it is not then we will send in 90 day supply to his mailorder follow-up 6 months  We will also refer to dermatology Dr. Rozann Lesches specifically because of recurring rash on the abdomen

## 2014-03-07 NOTE — Addendum Note (Signed)
Addended by: Sallee Lange A on: 03/07/2014 10:48 PM   Modules accepted: Orders

## 2014-03-20 ENCOUNTER — Ambulatory Visit: Payer: Medicare Other | Admitting: Family Medicine

## 2014-03-25 ENCOUNTER — Encounter: Payer: Self-pay | Admitting: Family Medicine

## 2014-03-27 ENCOUNTER — Telehealth: Payer: Self-pay | Admitting: Family Medicine

## 2014-03-27 NOTE — Telephone Encounter (Signed)
See attached to chart BP readings

## 2014-03-31 NOTE — Telephone Encounter (Signed)
Blood pressure readings look very good continue current measures. Please call the patient if he needs 90 day prescription sent in for his blood pressure medicine please send this to his mail order with 2 refills. He should follow-up in the springtime sooner if problems. Thank you

## 2014-04-01 ENCOUNTER — Other Ambulatory Visit: Payer: Self-pay | Admitting: *Deleted

## 2014-04-01 MED ORDER — LOSARTAN POTASSIUM 50 MG PO TABS: 50.0000 mg | ORAL_TABLET | Freq: Every day | ORAL | Status: DC

## 2014-04-01 NOTE — Telephone Encounter (Signed)
Discussed with patient. [facility]

## 2014-04-09 DIAGNOSIS — D443 Neoplasm of uncertain behavior of pituitary gland: Secondary | ICD-10-CM | POA: Diagnosis not present

## 2014-04-09 DIAGNOSIS — E032 Hypothyroidism due to medicaments and other exogenous substances: Secondary | ICD-10-CM | POA: Diagnosis not present

## 2014-04-09 DIAGNOSIS — E232 Diabetes insipidus: Secondary | ICD-10-CM | POA: Diagnosis not present

## 2014-04-17 DIAGNOSIS — C61 Malignant neoplasm of prostate: Secondary | ICD-10-CM | POA: Diagnosis not present

## 2014-04-18 DIAGNOSIS — L309 Dermatitis, unspecified: Secondary | ICD-10-CM | POA: Diagnosis not present

## 2014-04-18 DIAGNOSIS — X32XXXD Exposure to sunlight, subsequent encounter: Secondary | ICD-10-CM | POA: Diagnosis not present

## 2014-04-18 DIAGNOSIS — L570 Actinic keratosis: Secondary | ICD-10-CM | POA: Diagnosis not present

## 2014-04-24 DIAGNOSIS — N529 Male erectile dysfunction, unspecified: Secondary | ICD-10-CM | POA: Diagnosis not present

## 2014-04-24 DIAGNOSIS — C61 Malignant neoplasm of prostate: Secondary | ICD-10-CM | POA: Diagnosis not present

## 2014-07-09 DIAGNOSIS — H524 Presbyopia: Secondary | ICD-10-CM | POA: Diagnosis not present

## 2014-07-09 DIAGNOSIS — H5203 Hypermetropia, bilateral: Secondary | ICD-10-CM | POA: Diagnosis not present

## 2014-07-09 DIAGNOSIS — H52221 Regular astigmatism, right eye: Secondary | ICD-10-CM | POA: Diagnosis not present

## 2014-07-09 DIAGNOSIS — H25019 Cortical age-related cataract, unspecified eye: Secondary | ICD-10-CM | POA: Diagnosis not present

## 2014-07-10 ENCOUNTER — Ambulatory Visit (INDEPENDENT_AMBULATORY_CARE_PROVIDER_SITE_OTHER): Payer: Medicare Other | Admitting: Family Medicine

## 2014-07-10 ENCOUNTER — Encounter: Payer: Self-pay | Admitting: Family Medicine

## 2014-07-10 VITALS — BP 120/70 | Temp 98.1°F | Ht 67.0 in | Wt 151.2 lb

## 2014-07-10 DIAGNOSIS — J019 Acute sinusitis, unspecified: Secondary | ICD-10-CM

## 2014-07-10 DIAGNOSIS — I10 Essential (primary) hypertension: Secondary | ICD-10-CM | POA: Diagnosis not present

## 2014-07-10 DIAGNOSIS — D72829 Elevated white blood cell count, unspecified: Secondary | ICD-10-CM

## 2014-07-10 DIAGNOSIS — E038 Other specified hypothyroidism: Secondary | ICD-10-CM | POA: Diagnosis not present

## 2014-07-10 DIAGNOSIS — B9689 Other specified bacterial agents as the cause of diseases classified elsewhere: Secondary | ICD-10-CM

## 2014-07-10 DIAGNOSIS — E7800 Pure hypercholesterolemia, unspecified: Secondary | ICD-10-CM

## 2014-07-10 MED ORDER — AMOXICILLIN 500 MG PO TABS: 500.0000 mg | ORAL_TABLET | Freq: Three times a day (TID) | ORAL | Status: DC

## 2014-07-10 NOTE — Progress Notes (Signed)
   Subjective:    Patient ID: [name], male    DOB: 11-Mar-1941, 74 y.o.   MRN: [id number]  Cough This is a new problem. The current episode started in the past 7 days. The problem has been unchanged. The cough is productive of sputum. Associated symptoms include rhinorrhea and a sore throat. Pertinent negatives include no chest pain, ear pain, fever or wheezing. Associated symptoms comments: drainage. Nothing aggravates the symptoms. He has tried OTC cough suppressant for the symptoms. The treatment provided no relief.   Patient states he has no other concerns at this time.    Review of Systems  Constitutional: Negative for fever and activity change.  HENT: Positive for congestion, rhinorrhea and sore throat. Negative for ear pain.   Eyes: Negative for discharge.  Respiratory: Positive for cough. Negative for wheezing.   Cardiovascular: Negative for chest pain.       Objective:   Physical Exam  Constitutional: He appears well-developed.  HENT:  Head: Normocephalic.  Mouth/Throat: Oropharynx is clear and moist. No oropharyngeal exudate.  Neck: Normal range of motion.  Cardiovascular: Normal rate, regular rhythm and normal heart sounds.   No murmur heard. Pulmonary/Chest: Effort normal and breath sounds normal. He has no wheezes.  Lymphadenopathy:    He has no cervical adenopathy.  Neurological: He exhibits normal muscle tone.  Skin: Skin is warm and dry.  Nursing note and vitals reviewed.         Assessment & Plan:  Viral illness Secondary sinusitis Antibiotics prescribed Follow-up if ongoing troubles  Blood work ordered because patient having a lot of coldness feeling bad has history of thyroid and leukocytosis we will recheck in some lab work. He will be following up for wellness next week

## 2014-07-11 DIAGNOSIS — D72829 Elevated white blood cell count, unspecified: Secondary | ICD-10-CM | POA: Diagnosis not present

## 2014-07-11 DIAGNOSIS — E038 Other specified hypothyroidism: Secondary | ICD-10-CM | POA: Diagnosis not present

## 2014-07-11 DIAGNOSIS — I10 Essential (primary) hypertension: Secondary | ICD-10-CM | POA: Diagnosis not present

## 2014-07-11 DIAGNOSIS — E780 Pure hypercholesterolemia: Secondary | ICD-10-CM | POA: Diagnosis not present

## 2014-07-12 ENCOUNTER — Encounter: Payer: Medicare Other | Admitting: Family Medicine

## 2014-07-12 LAB — BASIC METABOLIC PANEL
BUN / CREAT RATIO: 8 — AB (ref 10–22)
BUN: 9 mg/dL (ref 8–27)
CALCIUM: 9.1 mg/dL (ref 8.6–10.2)
CO2: 23 mmol/L (ref 18–29)
Chloride: 94 mmol/L — ABNORMAL LOW (ref 97–108)
Creatinine, Ser: 1.11 mg/dL (ref 0.76–1.27)
GFR calc Af Amer: 77 mL/min/{1.73_m2} (ref >59)
GFR calc non Af Amer: 66 mL/min/{1.73_m2} (ref >59)
GLUCOSE: 102 mg/dL — AB (ref 65–99)
Potassium: 4.6 mmol/L (ref 3.5–5.2)
SODIUM: 136 mmol/L (ref 134–144)

## 2014-07-12 LAB — CBC WITH DIFFERENTIAL/PLATELET
BASOS ABS: 0 10*3/uL (ref 0.0–0.2)
Basos: 1 %
Eos: 5 %
Eosinophils Absolute: 0.4 10*3/uL (ref 0.0–0.4)
HCT: 51.4 % — ABNORMAL HIGH (ref 37.5–51.0)
Hemoglobin: 18 g/dL — ABNORMAL HIGH (ref 12.6–17.7)
IMMATURE GRANULOCYTES: 0 %
Immature Grans (Abs): 0 10*3/uL (ref 0.0–0.1)
Lymphocytes Absolute: 2.4 10*3/uL (ref 0.7–3.1)
Lymphs: 35 %
MCH: 32.5 pg (ref 26.6–33.0)
MCHC: 35 g/dL (ref 31.5–35.7)
MCV: 93 fL (ref 79–97)
MONOCYTES: 11 %
MONOS ABS: 0.7 10*3/uL (ref 0.1–0.9)
Neutrophils Absolute: 3.3 10*3/uL (ref 1.4–7.0)
Neutrophils Relative %: 48 %
Platelets: 169 10*3/uL (ref 150–379)
RBC: 5.53 x10E6/uL (ref 4.14–5.80)
RDW: 13.4 % (ref 12.3–15.4)
WBC: 6.8 10*3/uL (ref 3.4–10.8)

## 2014-07-12 LAB — LIPID PANEL
Chol/HDL Ratio: 6.4 ratio units — ABNORMAL HIGH (ref 0.0–5.0)
Cholesterol, Total: 235 mg/dL — ABNORMAL HIGH (ref 100–199)
HDL: 37 mg/dL — AB (ref >39)
LDL CALC: 144 mg/dL — AB (ref 0–99)
TRIGLYCERIDES: 269 mg/dL — AB (ref 0–149)
VLDL CHOLESTEROL CAL: 54 mg/dL — AB (ref 5–40)

## 2014-07-12 LAB — TSH: TSH: 1.52 u[IU]/mL (ref 0.450–4.500)

## 2014-07-15 ENCOUNTER — Encounter: Payer: Self-pay | Admitting: Family Medicine

## 2014-07-15 ENCOUNTER — Ambulatory Visit (INDEPENDENT_AMBULATORY_CARE_PROVIDER_SITE_OTHER): Payer: Medicare Other | Admitting: Family Medicine

## 2014-07-15 ENCOUNTER — Encounter: Payer: Self-pay | Admitting: Cardiovascular Disease

## 2014-07-15 VITALS — BP 126/88 | Ht 67.0 in | Wt 150.0 lb

## 2014-07-15 DIAGNOSIS — E7800 Pure hypercholesterolemia, unspecified: Secondary | ICD-10-CM

## 2014-07-15 DIAGNOSIS — Z23 Encounter for immunization: Secondary | ICD-10-CM

## 2014-07-15 DIAGNOSIS — D751 Secondary polycythemia: Secondary | ICD-10-CM

## 2014-07-15 DIAGNOSIS — I10 Essential (primary) hypertension: Secondary | ICD-10-CM | POA: Diagnosis not present

## 2014-07-15 DIAGNOSIS — Z0000 Encounter for general adult medical examination without abnormal findings: Secondary | ICD-10-CM

## 2014-07-15 DIAGNOSIS — R0600 Dyspnea, unspecified: Secondary | ICD-10-CM

## 2014-07-15 DIAGNOSIS — R0789 Other chest pain: Secondary | ICD-10-CM

## 2014-07-15 MED ORDER — PRAVASTATIN SODIUM 20 MG PO TABS: 20.0000 mg | ORAL_TABLET | Freq: Every evening | ORAL | Status: DC

## 2014-07-15 NOTE — Progress Notes (Signed)
Subjective:    Patient ID: [name], male    DOB: 1940/06/03, 74 y.o.   MRN: [id number]  HPI AWV- Annual Wellness Visit  The patient was seen for their annual wellness visit. The patient's past medical history, surgical history, and family history were reviewed. Pertinent vaccines were reviewed ( tetanus, pneumonia, shingles, flu) The patient's medication list was reviewed and updated.  The height and weight were entered. The patient's current BMI is: 23  Cognitive screening was completed. Outcome of Mini - Cog: Pass  Falls within the past 6 months: No  Current tobacco usage: No (All patients who use tobacco were given written and verbal information on quitting)  Recent listing of emergency department/hospitalizations over the past year were reviewed. current specialist the patient sees on a regular basis: Urologist   Medicare annual wellness visit patient questionnaire was reviewed.  A written screening schedule for the patient for the next 5-10 years was given. Appropriate discussion of followup regarding next visit was discussed.  Some shortness of breath     Review of Systems  Constitutional: Negative for fever, activity change and appetite change.  HENT: Negative for congestion and rhinorrhea.   Eyes: Negative for discharge.  Respiratory: Positive for shortness of breath. Negative for cough and wheezing.   Cardiovascular: Positive for chest pain.  Gastrointestinal: Negative for vomiting, abdominal pain and blood in stool.  Genitourinary: Negative for frequency and difficulty urinating.  Musculoskeletal: Negative for neck pain.  Skin: Negative for rash.  Allergic/Immunologic: Negative for environmental allergies and food allergies.  Neurological: Negative for weakness and headaches.  Psychiatric/Behavioral: Negative for agitation.       Objective:   Physical Exam  Constitutional: He appears well-developed and well-nourished.  HENT:  Head: Normocephalic  and atraumatic.  Right Ear: External ear normal.  Left Ear: External ear normal.  Nose: Nose normal.  Mouth/Throat: Oropharynx is clear and moist.  Eyes: EOM are normal. Pupils are equal, round, and reactive to light.  Neck: Normal range of motion. Neck supple. No thyromegaly present.  Cardiovascular: Normal rate, regular rhythm and normal heart sounds.   No murmur heard. Pulmonary/Chest: Effort normal and breath sounds normal. No respiratory distress. He has no wheezes.  Abdominal: Soft. Bowel sounds are normal. He exhibits no distension and no mass. There is no tenderness.  Genitourinary: Penis normal.  Musculoskeletal: Normal range of motion. He exhibits no edema.  Lymphadenopathy:    He has no cervical adenopathy.  Neurological: He is alert. He exhibits normal muscle tone.  Skin: Skin is warm and dry. No erythema.  Psychiatric: He has a normal mood and affect. His behavior is normal. Judgment normal.          Assessment & Plan:    Patient was in for wellness. As is common  For wellness exams,there are multiple other issues patient concerned about.  Safety measures dietary measures discussed in detail. Importance of regular physical activity discusse. Patient not depressed. Up-to-date on immunizations as well as colonoscopy. Cognitive skills good. The patient had multiple other issues that were very serious and pressing that required 25 minutes of additional time in effort along with multiple questions and answers. This required separate coding. Plus referral.   polycythemia-not really sure if this is a major issue at this point we'll recheck CBC in a few weeks if persistently elevated then referral to hematology for further workup   impotence- erectile dysfunction-  His urologist prescribes to him injectable that he uses for erections. He will  send [place] a request for this I told him we would refill it he is not on testosterone   DOB/chest pressure-I recommend that this patient have  cardiology workup his EKG looks good. I am concerned about the possibility of underlying cardiology issue possible developing angina  Hyperlipidemia patient consents to taking cholesterol medicine he has in the past and it caused body aches and joint pains he will try pravastatin every other day for the first 2 weeks then 1 daily    hyperglycemia glucose slightly elevated he was encouraged to watch starches in his diet.   Hemoglobin A1c lipid liver profile in 3 months.   Follow-up office visit 3 months.

## 2014-07-15 NOTE — Patient Instructions (Signed)

## 2014-07-25 ENCOUNTER — Ambulatory Visit (INDEPENDENT_AMBULATORY_CARE_PROVIDER_SITE_OTHER): Payer: Medicare Other | Admitting: Cardiology

## 2014-07-25 ENCOUNTER — Encounter: Payer: Self-pay | Admitting: Cardiology

## 2014-07-25 ENCOUNTER — Encounter: Payer: Self-pay | Admitting: *Deleted

## 2014-07-25 VITALS — BP 133/65 | HR 65 | Ht 67.0 in | Wt 149.8 lb

## 2014-07-25 DIAGNOSIS — I10 Essential (primary) hypertension: Secondary | ICD-10-CM | POA: Diagnosis not present

## 2014-07-25 DIAGNOSIS — I6523 Occlusion and stenosis of bilateral carotid arteries: Secondary | ICD-10-CM | POA: Diagnosis not present

## 2014-07-25 DIAGNOSIS — E782 Mixed hyperlipidemia: Secondary | ICD-10-CM

## 2014-07-25 DIAGNOSIS — R0602 Shortness of breath: Secondary | ICD-10-CM | POA: Diagnosis not present

## 2014-07-25 DIAGNOSIS — R072 Precordial pain: Secondary | ICD-10-CM

## 2014-07-25 NOTE — Patient Instructions (Signed)
Your physician recommends that you schedule a follow-up appointment TO BE DETERMINED AFTER YOUR TESTING  Your physician recommends that you continue on your current medications as directed. Please refer to the Current Medication list given to you today.  Your physician has requested that you have an echocardiogram. Echocardiography is a painless test that uses sound waves to create images of your heart. It provides your doctor with information about the size and shape of your heart and how well your heart's chambers and valves are working. This procedure takes approximately one hour. There are no restrictions for this procedure.  Your physician has requested that you have en exercise stress myoview. For further information please visit [email or web address]. Please follow instruction sheet, as given.  Thank you for choosing [facility]!!

## 2014-07-25 NOTE — Progress Notes (Signed)
Cardiology Office Note  Date: 07/25/2014   ID: [name], DOB December 31, 1940, MRN [id number]  PCP: Sallee Lange, MD  Primary Cardiologist: Rozann Lesches, MD   Chief Complaint  Patient presents with  . Shortness of Breath    History of Present Illness: [name] is a 74 y.o. male referred for cardiology consultation by Dr. Wolfgang Phoenix. I reviewed his history. He states that last year he developed [facility] Spotted Fever, indicates that he was seen by a rheumatologist who made the diagnosis based on serologies. Symptoms at that time included arthralgias, cramps, also a rash. He states that since then he has been more short of breath with activity, also has intermittent chest tightness. He does not report any clear history of obstructive CAD or microinfarction, had a stress test greater than 15 years ago and recalls there being a hypertensive response but no other major abnormalities. He has a long-standing history of hypertension, currently on Cozaar. Also has history of hyperlipidemia, recent LDL 144, on Pravachol.  I reviewed his recent ECG which is outlined below. He is not having any history of arrhythmia. In general, RMSF is not associated with cardiomyopathy or ischemic heart disease manifestations, occasionally there can be cardiac arrhythmias. Respiratory difficulties tend to be more associated with noncardiogenic causes such as pulmonary edema or ARDS. He has not had follow-up ischemic testing within the last 15 years. He does have evidence of atherosclerosis by chest CT imaging from last year.   Past Medical History  Diagnosis Date  . Colon polyps     Adenomatous  . Diverticulosis   . GERD (gastroesophageal reflux disease)   . Hypercholesterolemia   . Essential hypertension   . Prostate cancer   . Arthritis     Past Surgical History  Procedure Laterality Date  . Cholecystectomy  08/2007  . Removal of pituitary tumor  9/09  . Back surgery  1991  . Prostate surgery        Current Outpatient Prescriptions  Medication Sig Dispense Refill  . desmopressin (DDAVP) 0.1 MG tablet Take 0.1 mg by mouth 2 (two) times daily.    . diazepam (VALIUM) 5 MG tablet One half to one tablet every 8 hrs as needed for cramps 30 tablet 0  . levothyroxine (SYNTHROID, LEVOTHROID) 88 MCG tablet Take 88 mcg by mouth every other day.    . losartan (COZAAR) 50 MG tablet Take 1 tablet (50 mg total) by mouth daily. 90 tablet 2  . omeprazole (PRILOSEC) 20 MG capsule Take 1 capsule (20 mg total) by mouth daily. 90 capsule 3   No current facility-administered medications for this visit.    Allergies:  Codeine; Morphine; and Tramadol   Social History: The patient  reports that he has quit smoking. His smoking use included Cigarettes. He quit smokeless tobacco use about 36 years ago. He reports that he drinks alcohol. He reports that he uses illicit drugs.   Family History: The patient's family history includes Clotting disorder in his father; Heart attack in his father; Heart disease in his father. There is no history of Colon cancer or Diabetes.   ROS:  Please see the history of present illness. Otherwise, complete review of systems is positive for NYHA class 2-3 dyspnea, no orthopnea or PND.  All other systems are reviewed and negative.    Physical Exam: VS:  BP 133/65 mmHg  Pulse 65  Ht 5\' 7"  (1.702 m)  Wt 149 lb 12.8 oz (67.949 kg)  BMI 23.46 kg/m2  SpO2 94%, BMI Body mass index is 23.46 kg/(m^2).  Wt Readings from Last 3 Encounters:  07/25/14 149 lb 12.8 oz (67.949 kg)  07/15/14 150 lb (68.04 kg)  07/10/14 151 lb 4 oz (68.607 kg)     General: Patient appears comfortable at rest. HEENT: Conjunctiva and lids normal, oropharynx clear with moist mucosa. Neck: Supple, no elevated JVP or carotid bruits, no thyromegaly. Lungs: Clear to auscultation, nonlabored breathing at rest. Cardiac: Regular rate and rhythm, no S3 or significant systolic murmur, no pericardial  rub. Abdomen: Soft, nontender, no hepatomegaly, bowel sounds present, no guarding or rebound. Extremities: No pitting edema, distal pulses 2+. Skin: Warm and dry. Musculoskeletal: No kyphosis. Neuropsychiatric: Alert and oriented x3, affect grossly appropriate.   ECG: Recent tracing from visit with Dr. Wolfgang Phoenix showed normal sinus rhythm with early repolarization.   Recent Labwork: 09/25/2013: Magnesium 2.1 11/29/2013: ALT 20; AST 22 07/11/2014: BUN 9; Creatinine 1.11; Hemoglobin 18.0*; Platelets 169; Potassium 4.6; Sodium 136; TSH 1.520     Component Value Date/Time   CHOL 235* 07/11/2014 0803   CHOL 178 07/16/2013 0938   TRIG 269* 07/11/2014 0803   HDL 37* 07/11/2014 0803   HDL 35* 07/16/2013 0938   CHOLHDL 6.4* 07/11/2014 0803   CHOLHDL 5.1 07/16/2013 0938   VLDL 22 07/16/2013 0938   LDLCALC 144* 07/11/2014 0803   LDLCALC 121* 07/16/2013 1448    Other Studies Reviewed Today:  1. Chest CTA 10/16/2013: CT ANGIOGRAPHY CHEST WITH CONTRAST  TECHNIQUE: Multidetector CT imaging of the chest was performed using the standard protocol during bolus administration of intravenous contrast. Multiplanar CT image reconstructions and MIPs were obtained to evaluate the vascular anatomy.  CONTRAST: 194mL OMNIPAQUE IOHEXOL 350 MG/ML SOLN  COMPARISON: 03/10/2008  FINDINGS: No filling defect in the pulmonary arterial tree to suggest acute pulmonary thromboembolism.  No evidence of aortic aneurysm or dissection. Mild atherosclerotic changes of the arch. Mild plaque at the origin of the innominate artery. Mild plaque at the origin of the right common carotid artery. Right subclavian artery is patent. Left common carotid and subclavian arteries are patent. Right vertebral artery is patent. There is plaque at the origin of the left vertebral artery with suspected significant narrowing.  No evidence of abnormal mediastinal adenopathy.  Moderate centrilobular emphysema.  No  pneumothorax. No pleural effusion.  Review of the MIP images confirms the above findings.  IMPRESSION: No evidence of acute pulmonary thromboembolism  Significant narrowing at the origin of the left vertebral artery is suspected. Doppler examination may be helpful.  2. Carotid Dopplers 10/17/2013: IMPRESSION: Mild plaque bilaterally without focal carotid stenosis.  No retrograde flow is noted in the left vertebral artery. Velocities appear within normal limits.   Assessment and Plan:  1. Exertional shortness of breath and intermittent chest tightness over the last several months on baseline history of hypertension and hyperlipidemia, also documented atherosclerosis by chest CT from last year. History of apparent RMSF last year is probably not related as discussed above. ECG done recently showed sinus rhythm with normal intervals. Plan is to proceed with an echocardiogram to assess cardiac structure and function as well as an exercise Cardiolite for ischemic evaluation. We will plan to inform him with the results.  2. Essential hypertension, on Cozaar.  3. Hyperlipidemia, on Pravachol.  4. Mild carotid artery atherosclerosis by carotid Dopplers last year.   Current medicines are reviewed at length with the patient today.    Orders Placed This Encounter  Procedures  . Myocardial Perfusion Imaging  .  2D Echocardiogram without contrast    Disposition: Call with results.Weston Brass, Satira Sark, MD, [facility] 07/25/2014 3:33 PM    [facility] [place], [place], [place] [address] Phone: [phone]; Fax: [phone]

## 2014-07-26 ENCOUNTER — Encounter: Payer: Self-pay | Admitting: Cardiology

## 2014-07-31 ENCOUNTER — Encounter: Payer: Self-pay | Admitting: Family Medicine

## 2014-08-05 ENCOUNTER — Other Ambulatory Visit: Payer: Self-pay | Admitting: Cardiology

## 2014-08-05 DIAGNOSIS — R0602 Shortness of breath: Secondary | ICD-10-CM

## 2014-08-05 DIAGNOSIS — R0789 Other chest pain: Secondary | ICD-10-CM

## 2014-08-08 DIAGNOSIS — D45 Polycythemia vera: Secondary | ICD-10-CM | POA: Diagnosis not present

## 2014-08-08 DIAGNOSIS — D751 Secondary polycythemia: Secondary | ICD-10-CM | POA: Diagnosis not present

## 2014-08-09 LAB — CBC WITH DIFFERENTIAL/PLATELET
BASOS ABS: 0 10*3/uL (ref 0.0–0.2)
Basos: 1 %
Eos: 4 %
Eosinophils Absolute: 0.2 10*3/uL (ref 0.0–0.4)
HEMATOCRIT: 52.3 % — AB (ref 37.5–51.0)
HEMOGLOBIN: 17.6 g/dL (ref 12.6–17.7)
Immature Grans (Abs): 0 10*3/uL (ref 0.0–0.1)
Immature Granulocytes: 0 %
LYMPHS ABS: 2 10*3/uL (ref 0.7–3.1)
LYMPHS: 35 %
MCH: 32.2 pg (ref 26.6–33.0)
MCHC: 33.7 g/dL (ref 31.5–35.7)
MCV: 96 fL (ref 79–97)
Monocytes Absolute: 0.6 10*3/uL (ref 0.1–0.9)
Monocytes: 11 %
Neutrophils Absolute: 2.8 10*3/uL (ref 1.4–7.0)
Neutrophils Relative %: 49 %
Platelets: 183 10*3/uL (ref 150–379)
RBC: 5.46 x10E6/uL (ref 4.14–5.80)
RDW: 13.6 % (ref 12.3–15.4)
WBC: 5.7 10*3/uL (ref 3.4–10.8)

## 2014-08-16 ENCOUNTER — Encounter (HOSPITAL_COMMUNITY)
Admission: RE | Admit: 2014-08-16 | Discharge: 2014-08-16 | Disposition: A | Discharge status: Home / Self Care | Payer: Medicare Other | Source: Ambulatory Visit | Attending: Cardiology | Admitting: Cardiology

## 2014-08-16 ENCOUNTER — Ambulatory Visit (HOSPITAL_COMMUNITY)
Admission: RE | Admit: 2014-08-16 | Discharge: 2014-08-16 | Disposition: A | Discharge status: Home / Self Care | Payer: Medicare Other | Source: Ambulatory Visit | Attending: Cardiology | Admitting: Cardiology

## 2014-08-16 ENCOUNTER — Encounter (HOSPITAL_COMMUNITY): Payer: Self-pay

## 2014-08-16 DIAGNOSIS — R079 Chest pain, unspecified: Secondary | ICD-10-CM | POA: Diagnosis not present

## 2014-08-16 DIAGNOSIS — R0602 Shortness of breath: Secondary | ICD-10-CM | POA: Diagnosis not present

## 2014-08-16 DIAGNOSIS — R0789 Other chest pain: Secondary | ICD-10-CM

## 2014-08-16 DIAGNOSIS — I2510 Atherosclerotic heart disease of native coronary artery without angina pectoris: Secondary | ICD-10-CM | POA: Diagnosis not present

## 2014-08-16 DIAGNOSIS — R0600 Dyspnea, unspecified: Secondary | ICD-10-CM | POA: Diagnosis not present

## 2014-08-16 DIAGNOSIS — R072 Precordial pain: Secondary | ICD-10-CM

## 2014-08-16 MED ORDER — SODIUM CHLORIDE 0.9 % IJ SOLN
10.0000 mL | INTRAMUSCULAR | Status: DC | PRN
  Administered 2014-08-16: 10 mL via INTRAVENOUS
  Filled 2014-08-16: qty 10

## 2014-08-16 MED ORDER — REGADENOSON 0.4 MG/5ML IV SOLN
INTRAVENOUS | Status: AC
  Filled 2014-08-16: qty 5

## 2014-08-16 MED ORDER — SODIUM CHLORIDE 0.9 % IJ SOLN
INTRAMUSCULAR | Status: AC
  Filled 2014-08-16: qty 36

## 2014-08-16 MED ORDER — SODIUM CHLORIDE 0.9 % IJ SOLN
INTRAMUSCULAR | Status: AC
  Administered 2014-08-16: 10 mL via INTRAVENOUS
  Filled 2014-08-16: qty 3

## 2014-08-16 MED ORDER — TECHNETIUM TC 99M SESTAMIBI GENERIC - CARDIOLITE
10.0000 | Freq: Once | INTRAVENOUS | Status: AC | PRN
  Administered 2014-08-16: 10 via INTRAVENOUS

## 2014-08-16 MED ORDER — TECHNETIUM TC 99M SESTAMIBI - CARDIOLITE
30.0000 | Freq: Once | INTRAVENOUS | Status: AC | PRN
  Administered 2014-08-16: 11:00:00 30 via INTRAVENOUS

## 2014-08-16 NOTE — Progress Notes (Signed)
Stress Lab Nurses Notes - Forestine Na  NAHIEM DREDGE 08/16/2014 Reason for doing test: Chest pressure & Dyspnea Type of test: Stress Cardiolite Nurse performing test: Gerrit Halls, RN Nuclear Medicine Tech: Redmond Baseman Echo Tech: Not Applicable MD performing test: Koneswaran/K.Purcell Nails NP Family MD: Sallee Lange Test explained and consent signed: Yes.   IV started: Saline lock flushed, No redness or edema and Saline lock started in radiology Symptoms: chest pressure & fatigue Treatment/Intervention: None Reason test stopped: fatigue After recovery IV was: Discontinued via X-ray tech and No redness or edema Patient to return to Nuc. Med at : 11:30Patient discharged: Home Patient's Condition upon discharge was: stable Comments: During test peak BP 196/98 & HR 150.  Recovery BP 145/96 & HR 77.  Symptoms resolved in recovery. Geanie Cooley T

## 2014-08-16 NOTE — Progress Notes (Signed)
  Echocardiogram 2D Echocardiogram has been performed.  [name] 08/16/2014, 9:10 AM

## 2014-08-19 ENCOUNTER — Ambulatory Visit: Payer: Medicare Other | Admitting: Cardiovascular Disease

## 2014-10-11 ENCOUNTER — Telehealth: Payer: Self-pay | Admitting: Family Medicine

## 2014-10-11 NOTE — Telephone Encounter (Signed)
Discuss with pt what he means by no med, as in no med for cholesterol or no med for the neck. If the med caused severe neck pain then did the pain go away after stopping the pravastatin?

## 2014-10-11 NOTE — Telephone Encounter (Signed)
Patient was prescribed Pravastatin 07/2014

## 2014-10-11 NOTE — Telephone Encounter (Signed)
Pt states the last cholesterol med that was issued made his neck  So stiff he couldn't turn it to the left. He quit taking it, feels there is no Med for this that he can take. What would you recommend at this point?  [facility]

## 2014-10-11 NOTE — Telephone Encounter (Signed)
Patient had severe neck stiffness with cholesterol med and feels he can not tolerate cholesterol meds

## 2014-10-14 NOTE — Telephone Encounter (Signed)
At this point the best plan would be healthy diet. I don't recommend further medications. Red rice yeast extract is a over-the-counter preparation that can help with cholesterol but whether or not he tries this is up to him he could follow the directions on the bottle it's twice daily. A small number of people also have joint pains with that. It might be worth trying though since he cannot tolerate prescription medicine. In the fall time he will need repeat blood work and office visit

## 2014-10-14 NOTE — Telephone Encounter (Signed)
Discussed with pt. pt verbalized understanding.  

## 2014-10-15 ENCOUNTER — Ambulatory Visit: Payer: Medicare Other | Admitting: Family Medicine

## 2014-11-08 ENCOUNTER — Encounter: Payer: Self-pay | Admitting: Internal Medicine

## 2014-11-20 ENCOUNTER — Telehealth: Payer: Self-pay | Admitting: Family Medicine

## 2014-11-20 ENCOUNTER — Other Ambulatory Visit: Payer: Self-pay | Admitting: *Deleted

## 2014-11-20 MED ORDER — LOSARTAN POTASSIUM 50 MG PO TABS: 50.0000 mg | ORAL_TABLET | Freq: Every day | ORAL | Status: DC

## 2014-11-20 NOTE — Telephone Encounter (Signed)
Med sent to pharm. Pt notified on voicemail.  

## 2014-11-20 NOTE — Telephone Encounter (Signed)
Pt is needing a refill on his losartin called into [facility].

## 2014-11-21 ENCOUNTER — Other Ambulatory Visit: Payer: Self-pay | Admitting: Family Medicine

## 2014-11-22 ENCOUNTER — Ambulatory Visit (INDEPENDENT_AMBULATORY_CARE_PROVIDER_SITE_OTHER): Payer: Medicare Other | Admitting: Family Medicine

## 2014-11-22 ENCOUNTER — Encounter: Payer: Self-pay | Admitting: Family Medicine

## 2014-11-22 VITALS — BP 120/78 | Ht 67.0 in | Wt 149.2 lb

## 2014-11-22 DIAGNOSIS — M7712 Lateral epicondylitis, left elbow: Secondary | ICD-10-CM

## 2014-11-22 DIAGNOSIS — I6523 Occlusion and stenosis of bilateral carotid arteries: Secondary | ICD-10-CM | POA: Diagnosis not present

## 2014-11-22 MED ORDER — MELOXICAM 15 MG PO TABS: 15.0000 mg | ORAL_TABLET | Freq: Every day | ORAL | Status: DC

## 2014-11-22 NOTE — Progress Notes (Signed)
   Subjective:    Patient ID: [name], male    DOB: 1941-02-24, 74 y.o.   MRN: [id number]  HPI  Patient arries with c/o left elbow pain for 2-3 weeks. Patient denies any other symptoms currently. States he doesn't think he did anything triggered it. Does not know of any injury. Hurts when he grips or lifts things.  Review of Systems PMH benign    Objective:   Physical Exam  Elbow pain and discomfort more than likely related to tendinitis no sign of any trauma noted shoulder normal hand normal      Assessment & Plan:  Lateral epicondylitis should gradually get better anti-inflammatory's is not getting better with that consider injection.

## 2015-02-21 ENCOUNTER — Telehealth: Payer: Self-pay | Admitting: Family Medicine

## 2015-02-21 MED ORDER — OMEPRAZOLE 20 MG PO CPDR: DELAYED_RELEASE_CAPSULE | ORAL | Status: DC

## 2015-02-21 MED ORDER — LOSARTAN POTASSIUM 50 MG PO TABS: ORAL_TABLET | ORAL | Status: DC

## 2015-02-21 NOTE — Telephone Encounter (Signed)
Pt is needing refills on his omeprazole (PRILOSEC) 20 MG capsule and losartan (COZAAR) 50 MG tablet sent to [facility].

## 2015-02-21 NOTE — Telephone Encounter (Signed)
Refills sent to pharmacy. Patient was notified.  

## 2015-02-28 ENCOUNTER — Telehealth: Payer: Self-pay | Admitting: Family Medicine

## 2015-02-28 MED ORDER — DIAZEPAM 5 MG PO TABS: ORAL_TABLET | ORAL | Status: DC

## 2015-02-28 NOTE — Telephone Encounter (Signed)
[facility] 02/28/15

## 2015-02-28 NOTE — Telephone Encounter (Signed)
May send in new Rx, caution drowsiness

## 2015-02-28 NOTE — Telephone Encounter (Signed)
Patient says that he was prescribed diazepam (VALIUM) 5 MG tablet for severe cramping.  The cramping is back again, but the prescription he has is about a year and a half old and not working.  He says the pharmacy believes they may have lost some of their potency.  [name] wants to know if we can send in a new Rx for this.    [facility] [place]

## 2015-02-28 NOTE — Telephone Encounter (Signed)
Spoke with patient and informed him per Dr.Scott Luking- that we were sending in a Rx for Valium

## 2015-03-05 ENCOUNTER — Ambulatory Visit (INDEPENDENT_AMBULATORY_CARE_PROVIDER_SITE_OTHER): Payer: Medicare Other | Admitting: Family Medicine

## 2015-03-05 ENCOUNTER — Encounter: Payer: Self-pay | Admitting: Family Medicine

## 2015-03-05 VITALS — BP 128/72 | Ht 67.0 in | Wt 149.4 lb

## 2015-03-05 DIAGNOSIS — Z23 Encounter for immunization: Secondary | ICD-10-CM | POA: Diagnosis not present

## 2015-03-05 DIAGNOSIS — I6523 Occlusion and stenosis of bilateral carotid arteries: Secondary | ICD-10-CM | POA: Diagnosis not present

## 2015-03-05 MED ORDER — LOSARTAN POTASSIUM 50 MG PO TABS: ORAL_TABLET | ORAL | Status: DC

## 2015-03-05 MED ORDER — OMEPRAZOLE 20 MG PO CPDR: DELAYED_RELEASE_CAPSULE | ORAL | Status: DC

## 2015-03-05 NOTE — Progress Notes (Signed)
   Subjective:    Patient ID: [name], male    DOB: Aug 08, 1940, 74 y.o.   MRN: [id number]  Hypertension This is a chronic problem. The current episode started more than 1 year ago. Pertinent negatives include no chest pain. There are no compliance problems.     Patient would has c/o of left arm. Patient recently had fall from tree.  Patient fell about 4 feet. Injured shoulder elbow wrists. States he started feeling better week and half ago he denies any chest tightness pressure pain shortness of breath. Review of Systems  Constitutional: Negative for activity change, appetite change and fatigue.  HENT: Negative for congestion.   Respiratory: Negative for cough.   Cardiovascular: Negative for chest pain.  Gastrointestinal: Negative for abdominal pain.  Endocrine: Negative for polydipsia and polyphagia.  Neurological: Negative for weakness.  Psychiatric/Behavioral: Negative for confusion.       Objective:   Physical Exam  Constitutional: He appears well-nourished. No distress.  Cardiovascular: Normal rate, regular rhythm and normal heart sounds.   No murmur heard. Pulmonary/Chest: Effort normal and breath sounds normal. No respiratory distress.  Musculoskeletal: He exhibits no edema.  Lymphadenopathy:    He has no cervical adenopathy.  Neurological: He is alert.  Psychiatric: His behavior is normal.  Vitals reviewed.   Good range of motion of the left arm. Elbow shoulder wrists. Subjective discomfort lateral wrist      Assessment & Plan:  HTN decent control continue current medicines Left arm contusion should gradually get better  He states he was told by the [place] that they might help him out since he was exposed those 2 agents orange, this patient with prostate cancer about 6 years ago. Brain tumor several years back.

## 2015-04-10 DIAGNOSIS — E039 Hypothyroidism, unspecified: Secondary | ICD-10-CM | POA: Diagnosis not present

## 2015-04-10 DIAGNOSIS — E232 Diabetes insipidus: Secondary | ICD-10-CM | POA: Diagnosis not present

## 2015-04-10 DIAGNOSIS — I10 Essential (primary) hypertension: Secondary | ICD-10-CM | POA: Diagnosis not present

## 2015-04-10 DIAGNOSIS — D443 Neoplasm of uncertain behavior of pituitary gland: Secondary | ICD-10-CM | POA: Diagnosis not present

## 2015-04-10 DIAGNOSIS — E032 Hypothyroidism due to medicaments and other exogenous substances: Secondary | ICD-10-CM | POA: Diagnosis not present

## 2015-04-23 DIAGNOSIS — C61 Malignant neoplasm of prostate: Secondary | ICD-10-CM | POA: Diagnosis not present

## 2015-04-30 DIAGNOSIS — N5201 Erectile dysfunction due to arterial insufficiency: Secondary | ICD-10-CM | POA: Diagnosis not present

## 2015-04-30 DIAGNOSIS — C61 Malignant neoplasm of prostate: Secondary | ICD-10-CM | POA: Diagnosis not present

## 2015-04-30 DIAGNOSIS — E230 Hypopituitarism: Secondary | ICD-10-CM | POA: Diagnosis not present

## 2015-05-15 ENCOUNTER — Encounter: Payer: Self-pay | Admitting: Family Medicine

## 2015-05-15 ENCOUNTER — Ambulatory Visit (INDEPENDENT_AMBULATORY_CARE_PROVIDER_SITE_OTHER): Payer: Medicare Other | Admitting: Family Medicine

## 2015-05-15 VITALS — BP 138/80 | Temp 98.8°F | Ht 67.0 in | Wt 151.0 lb

## 2015-05-15 DIAGNOSIS — D353 Benign neoplasm of craniopharyngeal duct: Secondary | ICD-10-CM | POA: Diagnosis not present

## 2015-05-15 DIAGNOSIS — G4482 Headache associated with sexual activity: Secondary | ICD-10-CM

## 2015-05-15 DIAGNOSIS — D352 Benign neoplasm of pituitary gland: Secondary | ICD-10-CM | POA: Diagnosis not present

## 2015-05-15 DIAGNOSIS — R350 Frequency of micturition: Secondary | ICD-10-CM

## 2015-05-15 DIAGNOSIS — K5732 Diverticulitis of large intestine without perforation or abscess without bleeding: Secondary | ICD-10-CM

## 2015-05-15 LAB — POCT URINALYSIS DIPSTICK
Urobilinogen, UA: 4
pH, UA: 5

## 2015-05-15 MED ORDER — METRONIDAZOLE 500 MG PO TABS
500.0000 mg | ORAL_TABLET | Freq: Three times a day (TID) | ORAL | Status: DC
Start: 2015-05-15 — End: 2015-06-02

## 2015-05-15 MED ORDER — CIPROFLOXACIN HCL 500 MG PO TABS: 500.0000 mg | ORAL_TABLET | Freq: Two times a day (BID) | ORAL | Status: DC

## 2015-05-15 NOTE — Progress Notes (Signed)
   Subjective:    Patient ID: [name], male    DOB: 11-16-1940, 75 y.o.   MRN: [id number]  Abdominal Pain This is a new problem. Episode onset: 3 weeks ago. The abdominal pain radiates to the LLQ. Associated symptoms include constipation, flatus and headaches. Associated symptoms comments: Urinary frequency . Treatments tried: amoxil. diverticulosis   Sharp pain in the back of head every day for the past couple weeks. Takes aleve.    Review of Systems  Constitutional: Negative for activity change, appetite change and fatigue.  HENT: Negative for congestion.   Respiratory: Negative for cough.   Cardiovascular: Negative for chest pain.  Gastrointestinal: Positive for abdominal pain, constipation and flatus.  Endocrine: Negative for polydipsia and polyphagia.  Neurological: Positive for light-headedness and headaches. Negative for weakness.  Psychiatric/Behavioral: Negative for confusion.       Objective:   Physical Exam  Constitutional: He appears well-nourished. No distress.  HENT:  Head: Normocephalic.  Right Ear: External ear normal.  Left Ear: External ear normal.  Eyes: Left eye exhibits no discharge. No scleral icterus.  Cardiovascular: Normal rate, regular rhythm and normal heart sounds.   No murmur heard. Pulmonary/Chest: Effort normal and breath sounds normal. No respiratory distress.  Abdominal: Soft. There is tenderness (left lower quadrant tenderness).  Musculoskeletal: He exhibits no edema.  Lymphadenopathy:    He has no cervical adenopathy.  Neurological: He is alert. No cranial nerve deficit. Coordination normal.  Psychiatric: His behavior is normal.  Vitals reviewed.  No guarding or rebound  25 minutes was spent with the patient. Greater than half the time was spent in discussion and answering questions and counseling regarding the issues that the patient came in for today.      Assessment & Plan:  1. Frequent urination Urinalysis negative for any  type of infection - POCT urinalysis dipstick  2. PITUITARY MACROADENOMA This patient has history of a pituitary macroadenoma I don't think that this is what's causing his headache.  3. Headache associated with sexual activity This patient is not prone to headaches. He states he was having sex and had a severe headache that lasted 2 days in the back of his head he felt nauseated with it felt like his vision was slightly blurred finally got better and went away. He states he had sexual activity again and had a severe headache not quite as bad as this one. The first headache he had he describes as the worst headache he has ever had. This happened approximately 2 weeks ago.this is concerning for the possibility of aneurysm issue. I believe the patient does need MRA. We will go ahead and work on setting this up.  4. Diverticulitis of colon I believe the patient has diverticulitis. I recommend antibiotics. These were sent in. If he does not improve over the course of the next 7-10 days consider lab work. If he worsens we will need to do a CAT scan if he does not totally resolve within 2 weeks patient is to follow-up. Warning signs were discussed.

## 2015-05-20 ENCOUNTER — Telehealth: Payer: Self-pay | Admitting: Family Medicine

## 2015-05-20 DIAGNOSIS — R519 Headache, unspecified: Secondary | ICD-10-CM

## 2015-05-20 DIAGNOSIS — R51 Headache: Principal | ICD-10-CM

## 2015-05-20 NOTE — Telephone Encounter (Signed)
Pt states he was put on metroNIDAZOLE (FLAGYL) 500 MG tablet And ciprofloxacin (CIPRO) 500 MG tablet  He is having stiff neck, into back of head, achy and ears ringing since  He stopped taking them today, is there anything else he can take?   [facility]

## 2015-05-20 NOTE — Telephone Encounter (Signed)
Please set up MRI of the brain with and without contrast, reason for this severe headache onset after sexual activity been present for 1 week not going away causing nausea keeping him awake at night. Given that this patient has not had history of headaches he also has a history of a resection of a pituitary tumor and also has a history of new onset severe headaches MRI is indicated. Please set up MRI in notify patient of the MRI appointment. Prefer soonest available. Please notify the patient know later then January 11

## 2015-05-20 NOTE — Telephone Encounter (Signed)
Dr.Scott Luking to speak with patient.

## 2015-05-20 NOTE — Telephone Encounter (Signed)
Please discuss the case with the patient. He was being treated with these medications to cover for the diverticulitis. Please find out what problem he is currently having. The symptoms seem unusual in regards to side effects from medicine. How is his abdominal symptoms? How is his headaches? Any other symptoms?

## 2015-05-21 NOTE — Telephone Encounter (Signed)
MRA scheduled for 05/30/15 at 2:45pm and MRI appointment remains the same. Patient notified. Follow up appointment with Dr.Scott Luking Scheduled for 06/02/15

## 2015-05-21 NOTE — Telephone Encounter (Signed)
MRI scheduled for January 20th at 8:45 am at [facility]. Patient notified and verbalized understanding.

## 2015-05-21 NOTE — Telephone Encounter (Signed)
Patient called back stating that he scheduled an appointment to follow up with Dr.Scott on the 19th which was supposed to be for post MRI. Patient would like to know can he come in the following week such as the 23rd. Unable to move the patient's appointment up at [facility] for the MRI due to Radiology department having only 7pm appointments available on the 17th, and 18th which were the earliest available. Please advise?

## 2015-05-21 NOTE — Telephone Encounter (Signed)
This patient has a history of a pituitary tumor. It was resected several years ago but there was some residual tumor. A follow-up MRI on 2015 showed stable tumor present. Given that this guide does not have headaches and had severe headache with intercourse and his cause ongoing pain and discomfort in the back of his head over the past couple weeks I spoke with radiology regarding what would be the best testing to do. A MRI with and without contrast would be the best way to make sure that he does not have return of tumor causing his headache. And a MRA would be the best way to help rule out aneurysm as a source of his headache. So therefore-nurse's-please schedule patient for MRI with and without contrast as well as MRA. I would like for this patient to complete it as soon as possible. See if the patient would be willing to reconsider 7 PM on the 17th if not then the following week with a follow-up office visit 2 days later. Thank you

## 2015-05-29 ENCOUNTER — Ambulatory Visit: Payer: Medicare Other | Admitting: Family Medicine

## 2015-05-30 ENCOUNTER — Ambulatory Visit (HOSPITAL_COMMUNITY)
Admission: RE | Admit: 2015-05-30 | Discharge: 2015-05-30 | Disposition: A | Discharge status: Home / Self Care | Payer: Medicare Other | Source: Ambulatory Visit | Attending: Family Medicine | Admitting: Family Medicine

## 2015-05-30 DIAGNOSIS — R519 Headache, unspecified: Secondary | ICD-10-CM

## 2015-05-30 DIAGNOSIS — I6782 Cerebral ischemia: Secondary | ICD-10-CM | POA: Diagnosis not present

## 2015-05-30 DIAGNOSIS — G319 Degenerative disease of nervous system, unspecified: Secondary | ICD-10-CM | POA: Diagnosis not present

## 2015-05-30 DIAGNOSIS — R51 Headache: Secondary | ICD-10-CM | POA: Diagnosis present

## 2015-05-30 LAB — POCT I-STAT CREATININE: Creatinine, Ser: 1.1 mg/dL (ref 0.61–1.24)

## 2015-05-30 MED ORDER — GADOBENATE DIMEGLUMINE 529 MG/ML IV SOLN
14.0000 mL | Freq: Once | INTRAVENOUS | Status: AC | PRN
  Administered 2015-05-30: 14 mL via INTRAVENOUS

## 2015-06-02 ENCOUNTER — Ambulatory Visit (INDEPENDENT_AMBULATORY_CARE_PROVIDER_SITE_OTHER): Payer: Medicare Other | Admitting: Family Medicine

## 2015-06-02 ENCOUNTER — Encounter: Payer: Self-pay | Admitting: Family Medicine

## 2015-06-02 VITALS — BP 128/82 | Ht 67.0 in | Wt 149.8 lb

## 2015-06-02 DIAGNOSIS — R519 Headache, unspecified: Secondary | ICD-10-CM

## 2015-06-02 DIAGNOSIS — R51 Headache: Secondary | ICD-10-CM | POA: Diagnosis not present

## 2015-06-02 DIAGNOSIS — M5481 Occipital neuralgia: Secondary | ICD-10-CM | POA: Diagnosis not present

## 2015-06-02 MED ORDER — CEFPROZIL 500 MG PO TABS: 500.0000 mg | ORAL_TABLET | Freq: Two times a day (BID) | ORAL | Status: DC

## 2015-06-02 NOTE — Progress Notes (Signed)
   Subjective:    Patient ID: [name], male    DOB: [birth year], 75 y.o.   MRN: [id number]  HPI Patient arrives for a follow up on headaches and recent MRI and MRA. Results of the test both MRI MRA reviewed with patient. Overall this is good. No sign of any type of tumor abscess cyst etc.  Review of Systems He denies any type of unilateral numbness or weakness. He describes pain discomfort in the back of his had more than the right side. Denies chest tightness pressure pain shortness    Objective:   Physical Exam On physical exam he has subjective tenderness in the occipital area on the right side temporal area and no tenderness back of the scalp mildly tender lungs clear heart regular neurologic grossly normal       Assessment & Plan:  Patient still with headache that started after having sexual relations a few weeks ago this could be occipital neuralgia we will try anti-inflammatory will also check sedimentation rate also believe this patient would benefit from seeing neurology for a second opinion

## 2015-06-03 LAB — SEDIMENTATION RATE: Sed Rate: 2 mm/hr (ref 0–30)

## 2015-07-02 ENCOUNTER — Ambulatory Visit (INDEPENDENT_AMBULATORY_CARE_PROVIDER_SITE_OTHER): Payer: Medicare Other | Admitting: Neurology

## 2015-07-02 ENCOUNTER — Encounter: Payer: Self-pay | Admitting: Neurology

## 2015-07-02 VITALS — BP 132/70 | HR 80 | Ht 67.0 in | Wt 154.0 lb

## 2015-07-02 DIAGNOSIS — M542 Cervicalgia: Secondary | ICD-10-CM | POA: Diagnosis not present

## 2015-07-02 DIAGNOSIS — G4482 Headache associated with sexual activity: Secondary | ICD-10-CM | POA: Diagnosis not present

## 2015-07-02 DIAGNOSIS — R519 Headache, unspecified: Secondary | ICD-10-CM

## 2015-07-02 DIAGNOSIS — R51 Headache: Secondary | ICD-10-CM | POA: Diagnosis not present

## 2015-07-02 DIAGNOSIS — R200 Anesthesia of skin: Secondary | ICD-10-CM

## 2015-07-02 DIAGNOSIS — R292 Abnormal reflex: Secondary | ICD-10-CM

## 2015-07-02 DIAGNOSIS — R208 Other disturbances of skin sensation: Secondary | ICD-10-CM

## 2015-07-02 MED ORDER — INDOMETHACIN 25 MG PO CAPS: ORAL_CAPSULE | ORAL | Status: DC

## 2015-07-02 NOTE — Patient Instructions (Signed)
1. Take indomethacin 25 to 50mg  one to two hours before sexual activity. 2.  Will get MRI of cervical spine 3.  Follow up

## 2015-07-02 NOTE — Progress Notes (Signed)
NEUROLOGY CONSULTATION NOTE  ASHLEY POLACEK MRN: [id number] DOB: [birth year]  Referring provider: Dr. Wolfgang Phoenix Primary care provider: Dr. Wolfgang Phoenix  Reason for consult:  headache  HISTORY OF PRESENT ILLNESS: [name] is a 75 year old right-handed male with hypertension, GERD and hypothyroidism and history of prostate cancer who presents for headaches.  History obtained by patient and PCP note.  Imaging of brain MRI/MRA reviewed.  For several months, he has had occipital headaches.  It is an aching pain that sometimes is accompanied by a shooting up the right side of his occiput.  There is sometimes some right sided neck tenderness.  It occurs about once a month.  Sometimes, f he looks up, his eyes hurt and he feels lightheaded.  Most concerning, he has had severe posterior headache sometimes during and after coitus.   MRI of brain with and without contrast from 05/30/15 showed chronic small vessel disease but no acute/subacute stroke or mass lesion.  MRA of brain was negative Sed Rate from 06/02/15 was 2.  He reports remote history of severe headaches as a young man.  PAST MEDICAL HISTORY: Past Medical History  Diagnosis Date  . Colon polyps     Adenomatous  . Diverticulosis   . GERD (gastroesophageal reflux disease)   . Hypercholesterolemia   . Essential hypertension   . Prostate cancer ([place])   . Arthritis     PAST SURGICAL HISTORY: Past Surgical History  Procedure Laterality Date  . Cholecystectomy  08/2007  . Removal of pituitary tumor  9/09  . Back surgery  1991  . Prostate surgery      MEDICATIONS: Current Outpatient Prescriptions on File Prior to Visit  Medication Sig Dispense Refill  . cefPROZIL (CEFZIL) 500 MG tablet Take 1 tablet (500 mg total) by mouth 2 (two) times daily. 14 tablet 0  . desmopressin (DDAVP) 0.1 MG tablet Take 0.1 mg by mouth 2 (two) times daily.    . diazepam (VALIUM) 5 MG tablet One half to one tablet every 8 hrs as needed for cramps 30 tablet 0    . levothyroxine (SYNTHROID, LEVOTHROID) 88 MCG tablet Take 88 mcg by mouth every other day.    . losartan (COZAAR) 50 MG tablet TAKE 1 BY MOUTH DAILY 90 tablet 3  . omeprazole (PRILOSEC) 20 MG capsule TAKE 1 BY MOUTH DAILY 90 capsule 3   No current facility-administered medications on file prior to visit.    ALLERGIES: Allergies  Allergen Reactions  . Codeine   . Morphine   . Statins Other (See Comments)    Body aches intolerant to statins  . Tramadol     Sweat, light-headed    FAMILY HISTORY: Family History  Problem Relation Age of Onset  . Clotting disorder Father   . Heart disease Father   . Heart attack Father   . Colon cancer Neg Hx   . Diabetes Neg Hx     SOCIAL HISTORY: Social History   Social History  . Marital Status: Married    Spouse Name: N/A  . Number of Children: 2  . Years of Education: N/A   Occupational History  . Retired    Social History Main Topics  . Smoking status: Former Smoker    Types: Cigarettes  . Smokeless tobacco: Former Systems developer    Quit date: 11/03/1977  . Alcohol Use: 0.0 oz/week    0 Standard drinks or equivalent per week     Comment: Occasional  . Drug Use: Yes  . Sexual  Activity: Not on file   Other Topics Concern  . Not on file   Social History Narrative   Married with 2 children   Right handed   12+   2-3 cups daily    REVIEW OF SYSTEMS: Constitutional: No fevers, chills, or sweats, no generalized fatigue, change in appetite Eyes: No visual changes, double vision, eye pain Ear, nose and throat: No hearing loss, ear pain, nasal congestion, sore throat Cardiovascular: No chest pain, palpitations Respiratory:  No shortness of breath at rest or with exertion, wheezes GastrointestinaI: No nausea, vomiting, diarrhea, abdominal pain, fecal incontinence Genitourinary:  No dysuria, urinary retention or frequency Musculoskeletal:  No neck pain, back pain Integumentary: No rash, pruritus, skin lesions Neurological: as  above Psychiatric: No depression, insomnia, anxiety Endocrine: No palpitations, fatigue, diaphoresis, mood swings, change in appetite, change in weight, increased thirst Hematologic/Lymphatic:  No anemia, purpura, petechiae. Allergic/Immunologic: no itchy/runny eyes, nasal congestion, recent allergic reactions, rashes  PHYSICAL EXAM: Filed Vitals:   07/02/15 1017  BP: 132/70  Pulse: 80   General: No acute distress.  Patient appears well-groomed.  Head:  Normocephalic/atraumatic Eyes:  fundi unremarkable, without vessel changes, exudates, hemorrhages or papilledema. Neck: supple, no paraspinal tenderness, full range of motion Back: No paraspinal tenderness Heart: regular rate and rhythm Lungs: Clear to auscultation bilaterally. Vascular: No carotid bruits. Neurological Exam: Mental status: alert and oriented to person, place, and time, recent and remote memory intact, fund of knowledge intact, attention and concentration intact, speech fluent and not dysarthric, language intact. Cranial nerves: CN I: not tested CN II: pupils equal, round and reactive to light, visual fields intact, fundi unremarkable, without vessel changes, exudates, hemorrhages or papilledema. CN III, IV, VI:  full range of motion, no nystagmus, no ptosis CN V: facial sensation intact CN VII: upper and lower face symmetric CN VIII: hearing intact CN IX, X: gag intact, uvula midline CN XI: sternocleidomastoid and trapezius muscles intact CN XII: tongue midline Bulk & Tone: normal, no fasciculations. Motor:  5/5 throughout  Sensation:  Decreased pinprick sensation over the right foot and medial lower leg, and vibration sensation intact. Deep Tendon Reflexes:  Brisk in lower extremities with 3+ in left patellar, otherwise 2+ upper extremities.  Right Babinski Finger to nose testing:  Without dysmetria.  Heel to shin:  Without dysmetria.  Gait:  Normal station and stride.  Able to turn and tandem walk. Romberg  negative.  IMPRESSION: Occipital headaches, possibly cervicogenic/primary headache associated with sexual activity Neck pain.  Incidentally, he has numbness in the right lower extremities.  He also as asymmetric brisk reflexes in lower extremities with right Babinski.  I think he needs to be evaluated for cervical stenosis with myelopathy.  PLAN: MRI of cervical spine Indomethacin 25 to 50mg  1 to 2 hours prior to coitus Follow up  Thank you for allowing me to take part in the care of this patient.  Metta Clines, DO  CC:  Sallee Lange, MD

## 2015-07-10 ENCOUNTER — Telehealth: Payer: Self-pay | Admitting: Family Medicine

## 2015-07-10 NOTE — Telephone Encounter (Signed)
Would like to speak with you in regards to a MRI that was recently done And that fact that the specialist is wanting to run another one. He has concerns About this fact.   Please advise

## 2015-07-11 ENCOUNTER — Telehealth: Payer: Self-pay | Admitting: Family Medicine

## 2015-07-11 NOTE — Telephone Encounter (Signed)
Please get initial info. Was another MRI done? If so where? Which specialists is recommending additional MRI? That they give a reason why?

## 2015-07-11 NOTE — Telephone Encounter (Signed)
ERROR

## 2015-07-11 NOTE — Telephone Encounter (Signed)
MRI is scheduled for next Tuesday at [facility] by Dr. Tana Felts. Dr. Tana Felts states that the first one didn't cover the back of his neck. Patient received info from insurance in the mail stating the they will not cover the MRI. Patient does not have 3,000-6,000 dollars to pay for this test. Wants to talk to you concerning this.

## 2015-07-11 NOTE — Telephone Encounter (Signed)
I discussed with the patient his situation. He is concerned that the MRI next week may not be covered. He will call his neurologist and be certain that the neurology gets this approved through his insurance company.

## 2015-07-11 NOTE — Telephone Encounter (Signed)
The patient is also concerned that the recent MRI that we did for the brain and MRA was denied according to a letter that he got from his insurance company. It is hard over the phone to know if it was fully denied or if they just rode off a large portion of it. He will bring this letter by the office we will make a copy of it then I asked that Estill Bamberg look it over then let me know from a business perspective what is going on. The reason that this patient did have the MRI was he was having severe headaches new onset after sexual intercourse because of his age and his history of a pituitary tumor we did at MRI to rule out pituitary tumor and MRA to rule out aneurysm. Please look over what he brings by then bring me in the loop of what you find out

## 2015-07-14 ENCOUNTER — Telehealth: Payer: Self-pay | Admitting: Neurology

## 2015-07-14 NOTE — Telephone Encounter (Signed)
Pt is having a MRI tomorrow and wants to know if medicare is going to cover it or if he needs prior approval please call [phone]

## 2015-07-14 NOTE — Telephone Encounter (Signed)
Patient made aware his referral states "no authorization needed".

## 2015-07-15 ENCOUNTER — Ambulatory Visit (HOSPITAL_COMMUNITY)
Admission: RE | Admit: 2015-07-15 | Discharge: 2015-07-15 | Disposition: A | Discharge status: Home / Self Care | Payer: Medicare Other | Source: Ambulatory Visit | Attending: Neurology | Admitting: Neurology

## 2015-07-15 DIAGNOSIS — G4482 Headache associated with sexual activity: Secondary | ICD-10-CM | POA: Diagnosis not present

## 2015-07-15 DIAGNOSIS — M47812 Spondylosis without myelopathy or radiculopathy, cervical region: Secondary | ICD-10-CM | POA: Diagnosis not present

## 2015-07-15 DIAGNOSIS — R519 Headache, unspecified: Secondary | ICD-10-CM

## 2015-07-15 DIAGNOSIS — M4802 Spinal stenosis, cervical region: Secondary | ICD-10-CM | POA: Insufficient documentation

## 2015-07-15 DIAGNOSIS — M47892 Other spondylosis, cervical region: Secondary | ICD-10-CM | POA: Insufficient documentation

## 2015-07-15 DIAGNOSIS — R51 Headache: Secondary | ICD-10-CM | POA: Insufficient documentation

## 2015-07-16 NOTE — Telephone Encounter (Signed)
Rosie, this is what I spoke with you about last Friday.  We have verified that his insurance didn't need pre-cert, so not sure why it wouldn't be covered.  From what I have researched, I believe the diagnosis code should show medical necessity.  If you could please look in to.  Thanks!!  I layed the EOB on your desk.

## 2015-07-22 NOTE — Telephone Encounter (Signed)
The patient will need to contact Hospital Billing due to the scan being denied and not his actual visit with [place]. No prior authorization is required for the scan or lab work. Because  This is hospital billing, I cannot see outside of our service area.

## 2015-07-24 NOTE — Telephone Encounter (Signed)
Notified patient that we have looked into this, but we are unable to know the specifics on the denial because we are in a completely different service area.  I gave him the number to [facility] billing.

## 2015-08-18 ENCOUNTER — Ambulatory Visit: Payer: Medicare Other | Admitting: Family Medicine

## 2015-08-26 ENCOUNTER — Telehealth: Payer: Self-pay | Admitting: Family Medicine

## 2015-08-26 DIAGNOSIS — I10 Essential (primary) hypertension: Secondary | ICD-10-CM

## 2015-08-26 DIAGNOSIS — E039 Hypothyroidism, unspecified: Secondary | ICD-10-CM

## 2015-08-26 DIAGNOSIS — E7800 Pure hypercholesterolemia, unspecified: Secondary | ICD-10-CM

## 2015-08-26 DIAGNOSIS — Z139 Encounter for screening, unspecified: Secondary | ICD-10-CM

## 2015-08-26 NOTE — Telephone Encounter (Signed)
May repeat the same labs. Please clarify with patient if his PSAs being followed elsewhere?

## 2015-08-26 NOTE — Telephone Encounter (Signed)
Pt has appt 4/27 bw orders please   Last labs 07/11/14 BMP, Lip, CBC w/diff, TSH

## 2015-08-26 NOTE — Telephone Encounter (Signed)
Spoke with patient and informed him per [place] have been ordered and patient needs to be fasting prior to labs being drawn. Also verified that patient is currently following up with urology for PSA.

## 2015-08-27 DIAGNOSIS — E7800 Pure hypercholesterolemia, unspecified: Secondary | ICD-10-CM | POA: Diagnosis not present

## 2015-08-27 DIAGNOSIS — E039 Hypothyroidism, unspecified: Secondary | ICD-10-CM | POA: Diagnosis not present

## 2015-08-27 DIAGNOSIS — Z139 Encounter for screening, unspecified: Secondary | ICD-10-CM | POA: Diagnosis not present

## 2015-08-27 DIAGNOSIS — I10 Essential (primary) hypertension: Secondary | ICD-10-CM | POA: Diagnosis not present

## 2015-08-28 LAB — CBC WITH DIFFERENTIAL/PLATELET
BASOS: 1 %
Basophils Absolute: 0 10*3/uL (ref 0.0–0.2)
EOS (ABSOLUTE): 0.2 10*3/uL (ref 0.0–0.4)
EOS: 3 %
HEMATOCRIT: 47.3 % (ref 37.5–51.0)
HEMOGLOBIN: 16.6 g/dL (ref 12.6–17.7)
IMMATURE GRANS (ABS): 0 10*3/uL (ref 0.0–0.1)
IMMATURE GRANULOCYTES: 0 %
LYMPHS: 26 %
Lymphocytes Absolute: 1.6 10*3/uL (ref 0.7–3.1)
MCH: 31.5 pg (ref 26.6–33.0)
MCHC: 35.1 g/dL (ref 31.5–35.7)
MCV: 90 fL (ref 79–97)
MONOCYTES: 9 %
Monocytes Absolute: 0.6 10*3/uL (ref 0.1–0.9)
NEUTROS ABS: 3.7 10*3/uL (ref 1.4–7.0)
Neutrophils: 61 %
Platelets: 217 10*3/uL (ref 150–379)
RBC: 5.27 x10E6/uL (ref 4.14–5.80)
RDW: 13.2 % (ref 12.3–15.4)
WBC: 6.1 10*3/uL (ref 3.4–10.8)

## 2015-08-28 LAB — BASIC METABOLIC PANEL
BUN/Creatinine Ratio: 8 — ABNORMAL LOW (ref 10–24)
BUN: 9 mg/dL (ref 8–27)
CHLORIDE: 89 mmol/L — AB (ref 96–106)
CO2: 24 mmol/L (ref 18–29)
CREATININE: 1.11 mg/dL (ref 0.76–1.27)
Calcium: 9.2 mg/dL (ref 8.6–10.2)
GFR calc Af Amer: 76 mL/min/{1.73_m2} (ref >59)
GFR calc non Af Amer: 66 mL/min/{1.73_m2} (ref >59)
GLUCOSE: 89 mg/dL (ref 65–99)
Potassium: 5.4 mmol/L — ABNORMAL HIGH (ref 3.5–5.2)
SODIUM: 129 mmol/L — AB (ref 134–144)

## 2015-08-28 LAB — LIPID PANEL
CHOLESTEROL TOTAL: 220 mg/dL — AB (ref 100–199)
Chol/HDL Ratio: 4.8 ratio units (ref 0.0–5.0)
HDL: 46 mg/dL (ref >39)
LDL Calculated: 142 mg/dL — ABNORMAL HIGH (ref 0–99)
TRIGLYCERIDES: 161 mg/dL — AB (ref 0–149)
VLDL Cholesterol Cal: 32 mg/dL (ref 5–40)

## 2015-08-28 LAB — TSH: TSH: 1.63 u[IU]/mL (ref 0.450–4.500)

## 2015-09-03 ENCOUNTER — Encounter: Payer: Medicare Other | Admitting: Family Medicine

## 2015-09-04 ENCOUNTER — Encounter: Payer: Self-pay | Admitting: Family Medicine

## 2015-09-04 ENCOUNTER — Ambulatory Visit (INDEPENDENT_AMBULATORY_CARE_PROVIDER_SITE_OTHER): Payer: Medicare Other | Admitting: Family Medicine

## 2015-09-04 VITALS — BP 132/80 | Temp 98.7°F | Ht 67.75 in | Wt 148.0 lb

## 2015-09-04 DIAGNOSIS — I10 Essential (primary) hypertension: Secondary | ICD-10-CM | POA: Diagnosis not present

## 2015-09-04 DIAGNOSIS — M545 Low back pain: Secondary | ICD-10-CM | POA: Diagnosis not present

## 2015-09-04 DIAGNOSIS — E871 Hypo-osmolality and hyponatremia: Secondary | ICD-10-CM | POA: Diagnosis not present

## 2015-09-04 DIAGNOSIS — Z0000 Encounter for general adult medical examination without abnormal findings: Secondary | ICD-10-CM

## 2015-09-04 DIAGNOSIS — E7800 Pure hypercholesterolemia, unspecified: Secondary | ICD-10-CM | POA: Diagnosis not present

## 2015-09-04 DIAGNOSIS — R197 Diarrhea, unspecified: Secondary | ICD-10-CM

## 2015-09-04 LAB — POCT URINALYSIS DIPSTICK
Spec Grav, UA: 1.02
Urobilinogen, UA: 4
pH, UA: 7

## 2015-09-04 MED ORDER — DICLOFENAC SODIUM 75 MG PO TBEC: 75.0000 mg | DELAYED_RELEASE_TABLET | Freq: Two times a day (BID) | ORAL | Status: DC

## 2015-09-04 NOTE — Progress Notes (Signed)
   Subjective:    Patient ID: [name], male    DOB: August 24, 1940, 75 y.o.   MRN: [id number]  HPI AWV- Annual Wellness Visit  The patient was seen for their annual wellness visit. The patient's past medical history, surgical history, and family history were reviewed. Pertinent vaccines were reviewed ( tetanus, pneumonia, shingles, flu) The patient's medication list was reviewed and updated.  The height and weight were entered. The patient's current BMI is: 22.67  Cognitive screening was completed. Outcome of Mini - Cog: pass   Falls within the past 6 months: none  Current tobacco usage: none (All patients who use tobacco were given written and verbal information on quitting)  Recent listing of emergency department/hospitalizations over the past year were reviewed.  current specialist the patient sees on a regular basis: Dr. Alinda Money   Medicare annual wellness visit patient questionnaire was reviewed.  A written screening schedule for the patient for the next 5-10 years was given. Appropriate discussion of followup regarding next visit was discussed.  Low back pain on left side. Started a couple weeks ago. Not taking anything for pain. Drinking cranberry juice.   Patient denies hematuria did not denies hematochezia. Denies fever chills sweats. States he takes his blood pressure medicine and his thyroid medicine Unfortunately patient cannot tolerate statins Lab work was reviewed with the patient was recommended for him to repeat metabolic profile. Cholesterol moderately elevated. Importance of watching diet discussed Thyroid good control continue current measures Review of Systems    patient denies headaches except for occasional pain in the back of his head he saw the neurologist for this denies blurred vision denies excessive thirst urination denies chest tightness pressure pain shortness breath abdominal pain. Patient does relate low back pain on the left lower back he is  worried a little bit about cancer but he's been up-to-date on his: Colonoscopies as well as following with urology after his prostate cancer. Objective:   Physical Exam  Lungs are clear hearts regular pulse normal abdomen soft extremities no edema subjective low back tenderness negative straight leg raise       Assessment & Plan:  Lower lumbar spine left side anti-inflammatory follow-up in a few weeks if not doing better will need more comprehensive evaluation make sure there is no metastatic bone lesions.  Intermittent diarrhea stool culture and Clostridium difficile PCR recommended  Blood pressure good control continue current measures  Thyroid good control continue current measures follows with endocrinology  Low sodium possibly due to his DDAVP will send copies of his lab work to his endocrinologist for further evaluation  Safety dietary measures recommended and discussed. Patient up-to-date on colonoscopy.

## 2015-09-05 DIAGNOSIS — M545 Low back pain: Secondary | ICD-10-CM | POA: Diagnosis not present

## 2015-09-05 DIAGNOSIS — Z0000 Encounter for general adult medical examination without abnormal findings: Secondary | ICD-10-CM | POA: Diagnosis not present

## 2015-09-05 DIAGNOSIS — R197 Diarrhea, unspecified: Secondary | ICD-10-CM | POA: Diagnosis not present

## 2015-09-06 LAB — CLOSTRIDIUM DIFFICILE BY PCR: Toxigenic C. Difficile by PCR: NEGATIVE

## 2015-09-10 LAB — STOOL CULTURE: E coli, Shiga toxin Assay: NEGATIVE

## 2015-09-16 ENCOUNTER — Telehealth: Payer: Self-pay | Admitting: Family Medicine

## 2015-09-16 DIAGNOSIS — E871 Hypo-osmolality and hyponatremia: Secondary | ICD-10-CM

## 2015-09-16 NOTE — Telephone Encounter (Signed)
Due to the patient's low sodium present on his last lab work I recommend that this patient be seen by his endocrinologist Dr.Ballen  It is possible that this low sodium could be related to his DDAVP use. Please make sure copy of the most recent lab work overall with a copy of the recent office visit is sent to Dr. Soyla Murphy for the purpose of evaluating this gentleman on his low sodium. Please make the referral and then notify patient of the appointment

## 2015-09-17 NOTE — Telephone Encounter (Signed)
Discussed with patient. Patient advised thatdue to the patient's low sodium present on his last lab work Dr Nicki Reaper recommends that this patient be seen by his endocrinologist Dr.Ballen It is possible that this low sodium could be related to his DDAVP use. Please make sure copy of the most recent lab work overall with a copy of the recent office visit is sent to Dr. Soyla Murphy for the purpose of evaluating this gentleman on his low sodium. Patient verbalized understanding. Referral ordered in EPIC.

## 2015-09-17 NOTE — Addendum Note (Signed)
Addended by: Dairl Ponder on: 09/17/2015 10:47 AM   Modules accepted: Orders

## 2015-09-24 DIAGNOSIS — E032 Hypothyroidism due to medicaments and other exogenous substances: Secondary | ICD-10-CM | POA: Diagnosis not present

## 2015-09-24 DIAGNOSIS — D443 Neoplasm of uncertain behavior of pituitary gland: Secondary | ICD-10-CM | POA: Diagnosis not present

## 2015-09-24 DIAGNOSIS — E232 Diabetes insipidus: Secondary | ICD-10-CM | POA: Diagnosis not present

## 2015-09-24 DIAGNOSIS — E871 Hypo-osmolality and hyponatremia: Secondary | ICD-10-CM | POA: Diagnosis not present

## 2015-09-25 ENCOUNTER — Encounter: Payer: Self-pay | Admitting: Family Medicine

## 2015-09-25 ENCOUNTER — Ambulatory Visit (INDEPENDENT_AMBULATORY_CARE_PROVIDER_SITE_OTHER): Payer: Medicare Other | Admitting: Family Medicine

## 2015-09-25 VITALS — BP 118/78 | Ht 67.75 in | Wt 151.6 lb

## 2015-09-25 DIAGNOSIS — G4700 Insomnia, unspecified: Secondary | ICD-10-CM | POA: Diagnosis not present

## 2015-09-25 DIAGNOSIS — M545 Low back pain, unspecified: Secondary | ICD-10-CM

## 2015-09-25 NOTE — Progress Notes (Signed)
   Subjective:    Patient ID: [name], male    DOB: 1940/11/15, 75 y.o.   MRN: [id number]  Back Pain This is a recurrent problem. The problem occurs 2 to 4 times per day. The problem has been waxing and waning since onset. The pain is present in the lumbar spine. The quality of the pain is described as aching. The pain does not radiate. The pain is at a severity of 5/10. The pain is moderate. The pain is worse during the day. The symptoms are aggravated by bending. Pertinent negatives include no chest pain or fever. Risk factors include history of cancer. He has tried analgesics for the symptoms. The treatment provided mild relief.  Insomnia Primary symptoms: difficulty falling asleep, no somnolence, frequent awakening.  The current episode started more than one month. The onset quality is gradual. The problem occurs nightly. The problem is unchanged. The treatment provided no relief. Typical bedtime:  10-11 P.M..     Patient arrives for a follow up on back pain. Patient states it still hurts some but he is doing overall ok.  Review of Systems  Constitutional: Negative for fever and fatigue.  HENT: Negative for congestion.   Respiratory: Positive for cough and shortness of breath.   Cardiovascular: Negative for chest pain.  Musculoskeletal: Positive for back pain.  Psychiatric/Behavioral: The patient has insomnia.        Objective:   Physical Exam  Lungs clear hearts regular HEENT benign low back subjective tenderness negative straight leg raise good range of motion      Assessment & Plan:  Lumbar pain significant discomfort sometimes radiates into the top of the thighs denies high fever chills sweats. Denies night sweats denies waking up with pain or in the night relates a lot of stiffness when he's been sitting for a significant length time previous history of surgery I would recommend patient have x-rays his recent PSA was undetectable severe for I do not feel that this is  metastatic prostate cancer  Insomnia may try OTC Benadryl half tablet daily at bedtime when necessary I don't recommend sleeping pills  Patient states he had itching in the skin several years ago he would like to find out when that was for the purposes of his VA disability

## 2015-10-01 DIAGNOSIS — E232 Diabetes insipidus: Secondary | ICD-10-CM | POA: Diagnosis not present

## 2015-10-06 ENCOUNTER — Telehealth: Payer: Self-pay | Admitting: Family Medicine

## 2015-10-06 NOTE — Telephone Encounter (Signed)
The patient was recently seen to discuss his health. He requested records regarding a rash he had several years ago on his chest which he stated may be related to agent orange. Please give a copy of office visit note from 06/06/2007 for the patient. Please make sure that our office stamp is on this. Thank you

## 2015-10-27 ENCOUNTER — Ambulatory Visit (INDEPENDENT_AMBULATORY_CARE_PROVIDER_SITE_OTHER): Payer: Medicare Other | Admitting: Neurology

## 2015-10-27 DIAGNOSIS — R51 Headache: Secondary | ICD-10-CM

## 2015-10-28 NOTE — Progress Notes (Signed)
No show

## 2015-11-18 DIAGNOSIS — E032 Hypothyroidism due to medicaments and other exogenous substances: Secondary | ICD-10-CM | POA: Diagnosis not present

## 2015-11-18 DIAGNOSIS — E232 Diabetes insipidus: Secondary | ICD-10-CM | POA: Diagnosis not present

## 2015-11-18 DIAGNOSIS — D443 Neoplasm of uncertain behavior of pituitary gland: Secondary | ICD-10-CM | POA: Diagnosis not present

## 2015-11-18 DIAGNOSIS — I10 Essential (primary) hypertension: Secondary | ICD-10-CM | POA: Diagnosis not present

## 2015-11-20 ENCOUNTER — Ambulatory Visit (HOSPITAL_COMMUNITY)
Admission: RE | Admit: 2015-11-20 | Discharge: 2015-11-20 | Disposition: A | Discharge status: Home / Self Care | Payer: Medicare Other | Source: Ambulatory Visit | Attending: Family Medicine | Admitting: Family Medicine

## 2015-11-20 DIAGNOSIS — I700 Atherosclerosis of aorta: Secondary | ICD-10-CM | POA: Insufficient documentation

## 2015-11-20 DIAGNOSIS — M545 Low back pain: Secondary | ICD-10-CM | POA: Insufficient documentation

## 2015-11-20 DIAGNOSIS — M5136 Other intervertebral disc degeneration, lumbar region: Secondary | ICD-10-CM | POA: Diagnosis not present

## 2015-11-21 ENCOUNTER — Telehealth: Payer: Self-pay | Admitting: *Deleted

## 2015-11-21 DIAGNOSIS — R350 Frequency of micturition: Secondary | ICD-10-CM

## 2015-11-21 NOTE — Telephone Encounter (Signed)
Please put in referral see if his appointment can be moved up because of his symptomatology thank you

## 2015-11-21 NOTE — Telephone Encounter (Signed)
Left message to return call 

## 2015-11-21 NOTE — Telephone Encounter (Signed)
Pt.notified

## 2015-11-21 NOTE — Telephone Encounter (Signed)
Pt wants referral to Dr. Alinda Money at [facility] urology. He states he already has appt in December but wants to know if we can get it moved up. He saw Dr. Chalmers Cater and was discussing his urinary symptoms and she told him to follow up with urology. Having frequent urination. Wakes about every hour to use bathroom. No pain, no fever.  Increased desmoprssin to 4 day and he only got up one time last night.

## 2015-11-21 NOTE — Telephone Encounter (Signed)
Referral put in. [facility] to notifiy pt.

## 2015-12-02 DIAGNOSIS — R351 Nocturia: Secondary | ICD-10-CM | POA: Diagnosis not present

## 2016-02-12 ENCOUNTER — Other Ambulatory Visit: Payer: Self-pay | Admitting: *Deleted

## 2016-02-12 ENCOUNTER — Telehealth: Payer: Self-pay | Admitting: Family Medicine

## 2016-02-12 MED ORDER — OMEPRAZOLE 20 MG PO CPDR: DELAYED_RELEASE_CAPSULE | ORAL | 1 refills | Status: DC

## 2016-02-12 NOTE — Telephone Encounter (Signed)
Pt needs 90 Rx with refills of his omeprazole (PRILOSEC) 20 MG capsule  Please send order to [facility]  Please call pt when done

## 2016-02-12 NOTE — Telephone Encounter (Signed)
Med sent to pharm. Pt notified.  

## 2016-02-13 ENCOUNTER — Other Ambulatory Visit: Payer: Self-pay | Admitting: *Deleted

## 2016-02-18 ENCOUNTER — Encounter: Payer: Self-pay | Admitting: Family Medicine

## 2016-02-18 ENCOUNTER — Ambulatory Visit (INDEPENDENT_AMBULATORY_CARE_PROVIDER_SITE_OTHER): Payer: Medicare Other | Admitting: Family Medicine

## 2016-02-18 VITALS — BP 118/74 | Ht 67.0 in | Wt 145.0 lb

## 2016-02-18 DIAGNOSIS — E7800 Pure hypercholesterolemia, unspecified: Secondary | ICD-10-CM

## 2016-02-18 DIAGNOSIS — E038 Other specified hypothyroidism: Secondary | ICD-10-CM

## 2016-02-18 DIAGNOSIS — Z23 Encounter for immunization: Secondary | ICD-10-CM | POA: Diagnosis not present

## 2016-02-18 DIAGNOSIS — R5383 Other fatigue: Secondary | ICD-10-CM | POA: Diagnosis not present

## 2016-02-18 DIAGNOSIS — I10 Essential (primary) hypertension: Secondary | ICD-10-CM | POA: Diagnosis not present

## 2016-02-18 DIAGNOSIS — H5713 Ocular pain, bilateral: Secondary | ICD-10-CM

## 2016-02-18 MED ORDER — LOSARTAN POTASSIUM 50 MG PO TABS: ORAL_TABLET | ORAL | 1 refills | Status: DC

## 2016-02-18 MED ORDER — DIAZEPAM 5 MG PO TABS: ORAL_TABLET | ORAL | 1 refills | Status: DC

## 2016-02-18 NOTE — Progress Notes (Signed)
   Subjective:    Patient ID: [name], male    DOB: 07/03/1940, 75 y.o.   MRN: [id number]  HPIHaving fatigue for the past two weeks. Pt states his sodium was low awhile back and this is the way he felt then and is wondering if sodium is low now.  fatigue Wants flu vaccine today.   Needs new rx for valium. Takes for cramps.  The patient states he uses Valium intermittently always uses a home helps with cramps does not use it often He does take his blood pressure medicine on a regular basis as well as his acid blocker and his thyroid medicine. He does have a history of low sodium and is having fatigue related Also has history of snoring at night He denies any night sweats chills fevers denies chest congestion coughing wheezing   Review of Systems  Constitutional: Positive for appetite change (less than normal) and fatigue. Negative for diaphoresis and fever.  HENT: Negative for congestion.   Respiratory: Negative for cough and shortness of breath.   Cardiovascular: Negative for chest pain.  Gastrointestinal: Negative for abdominal pain.  Musculoskeletal: Negative for arthralgias.       Objective:   Physical Exam  Constitutional: He appears well-nourished. No distress.  Cardiovascular: Normal rate, regular rhythm and normal heart sounds.   No murmur heard. Pulmonary/Chest: Effort normal and breath sounds normal. No respiratory distress.  Musculoskeletal: He exhibits no edema.  Lymphadenopathy:    He has no cervical adenopathy.  Neurological: He is alert.  Psychiatric: His behavior is normal.  Vitals reviewed. No carotid bruit     25 minutes was spent with the patient. Greater than half the time was spent in discussion and answering questions and counseling regarding the issues that the patient came in for today.  Assessment & Plan:  Eye pain-He states he gets this pain when he is trying to look upward he describes it as a aching pain behind the eyes no dizziness no  passing out I encouraged him to see his eye doctor regarding this. A copy of this is being sent to Dr. Jorja Loa  HTN good control continue current medication watch diet  Thyroid good control lab work ordered previous labs reviewed continue medication  Hyperlipidemia continue watching diet is due for lab work  Fatigue and tiredness lab work ordered, sleep study ordered await the results

## 2016-02-19 ENCOUNTER — Other Ambulatory Visit: Payer: Self-pay

## 2016-02-19 DIAGNOSIS — Z79899 Other long term (current) drug therapy: Secondary | ICD-10-CM

## 2016-02-19 LAB — BASIC METABOLIC PANEL
BUN/Creatinine Ratio: 11 (ref 10–24)
BUN: 9 mg/dL (ref 8–27)
CALCIUM: 9.7 mg/dL (ref 8.6–10.2)
CHLORIDE: 88 mmol/L — AB (ref 96–106)
CO2: 23 mmol/L (ref 18–29)
Creatinine, Ser: 0.84 mg/dL (ref 0.76–1.27)
GFR calc non Af Amer: 87 mL/min/{1.73_m2} (ref >59)
GFR, EST AFRICAN AMERICAN: 100 mL/min/{1.73_m2} (ref >59)
Glucose: 95 mg/dL (ref 65–99)
POTASSIUM: 5.6 mmol/L — AB (ref 3.5–5.2)
Sodium: 129 mmol/L — ABNORMAL LOW (ref 134–144)

## 2016-02-19 LAB — CBC WITH DIFFERENTIAL/PLATELET
BASOS: 1 %
Basophils Absolute: 0 10*3/uL (ref 0.0–0.2)
EOS (ABSOLUTE): 0.2 10*3/uL (ref 0.0–0.4)
Eos: 3 %
Hematocrit: 43.9 % (ref 37.5–51.0)
Hemoglobin: 15.3 g/dL (ref 12.6–17.7)
IMMATURE GRANULOCYTES: 0 %
Immature Grans (Abs): 0 10*3/uL (ref 0.0–0.1)
LYMPHS ABS: 1.7 10*3/uL (ref 0.7–3.1)
Lymphs: 28 %
MCH: 30.4 pg (ref 26.6–33.0)
MCHC: 34.9 g/dL (ref 31.5–35.7)
MCV: 87 fL (ref 79–97)
MONOS ABS: 0.7 10*3/uL (ref 0.1–0.9)
Monocytes: 11 %
NEUTROS PCT: 57 %
Neutrophils Absolute: 3.4 10*3/uL (ref 1.4–7.0)
PLATELETS: 225 10*3/uL (ref 150–379)
RBC: 5.04 x10E6/uL (ref 4.14–5.80)
RDW: 13 % (ref 12.3–15.4)
WBC: 6 10*3/uL (ref 3.4–10.8)

## 2016-02-19 LAB — TSH: TSH: 1.4 u[IU]/mL (ref 0.450–4.500)

## 2016-02-19 MED ORDER — AMLODIPINE BESYLATE 2.5 MG PO TABS: 2.5000 mg | ORAL_TABLET | Freq: Every day | ORAL | 1 refills | Status: DC

## 2016-02-27 ENCOUNTER — Telehealth: Payer: Self-pay | Admitting: Family Medicine

## 2016-02-27 NOTE — Telephone Encounter (Signed)
This is certainly a interesting issue. I have never run into this as a potential side effects of this medicine. If he certainly believes that this was the cause there is no easy way to prove it other than to stop the medicine and see if the symptoms go away over the next few days. So I would recommend the patient stopped this medicine but if the headache or the inability to turn the head is not gone away by next week he needs to be seen. If anything unusual was happening such as one-sided numbness weakness severe headache vomiting fevers he needs to be seen right away. If the symptoms go away after stopping the medicine he should let us know this next week and we may need to bring him back by for a blood pressure check

## 2016-02-27 NOTE — Telephone Encounter (Signed)
Patient was prescribed Amlodipine last week by Dr. Nicki Reaper. He says ever since he started taking this medication that he has had a headache and he cant turn his head.  Please advise.

## 2016-02-27 NOTE — Telephone Encounter (Signed)
Discussed with pt. Pt verbalized understanding.  °

## 2016-03-22 ENCOUNTER — Telehealth: Payer: Self-pay | Admitting: Family Medicine

## 2016-03-22 DIAGNOSIS — I10 Essential (primary) hypertension: Secondary | ICD-10-CM

## 2016-03-22 DIAGNOSIS — D225 Melanocytic nevi of trunk: Secondary | ICD-10-CM | POA: Diagnosis not present

## 2016-03-22 DIAGNOSIS — C44219 Basal cell carcinoma of skin of left ear and external auricular canal: Secondary | ICD-10-CM | POA: Diagnosis not present

## 2016-03-22 MED ORDER — HYDROCHLOROTHIAZIDE 12.5 MG PO CAPS: ORAL_CAPSULE | ORAL | 4 refills | Status: DC

## 2016-03-22 NOTE — Telephone Encounter (Signed)
Patient called stating he stopped taking blood pressure medication norvasc 2.5 mg making his neck hurt could you prescribe something else and call into [facility] [place].

## 2016-03-22 NOTE — Telephone Encounter (Signed)
Spoke with patient and patient stated that he stopped medication for a few weeks and started back taking the medication on yesterday and the neck pain was restarted. Patient stated that he would just like another blood pressure medication called into the pharmacy.

## 2016-03-22 NOTE — Telephone Encounter (Signed)
Nurses please talk with patient. This would be a very unusual side effect. Typically in order to help know if a medication has caused a problem we recommend stopping the medicine for 7-10 days the patient should take notice it if the side effect they're concerned about went away. Then restart the medication-if same symptoms occur again then we are more certain that it is the medication if the patient goes through all of this is able to restart the medicine does not have this then it is likely that the medication is not causing that side effect also if he stops the medicine in the symptoms doesn't go away it is likely that the medication is not the cause of his neck pain. There are other blood pressure medicines that we could try if patient is just frustrated and once to try something different

## 2016-03-22 NOTE — Telephone Encounter (Signed)
Spoke with patient and informed him per Dr.Scott Luking- #1 discontinue amlodipine No. 2 HCTZ 12.5 mg, #30, one every morning, 4 refills, check metabolic 7 in 3 weeks, follow-up office visit in January to recheck blood pressure-report to [place] if any problems. Patient verbalized understanding.

## 2016-03-22 NOTE — Telephone Encounter (Signed)
#  1 discontinue amlodipine No. 2 HCTZ 12.5 mg, #30, one every morning, 4 refills, check metabolic 7 in 3 weeks, follow-up office visit in January to recheck blood pressure-report to [place] if any problems

## 2016-04-06 DIAGNOSIS — I10 Essential (primary) hypertension: Secondary | ICD-10-CM | POA: Diagnosis not present

## 2016-04-07 ENCOUNTER — Encounter: Payer: Self-pay | Admitting: Family Medicine

## 2016-04-07 LAB — BASIC METABOLIC PANEL
BUN / CREAT RATIO: 12 (ref 10–24)
BUN: 10 mg/dL (ref 8–27)
CALCIUM: 9.3 mg/dL (ref 8.6–10.2)
CHLORIDE: 93 mmol/L — AB (ref 96–106)
CO2: 24 mmol/L (ref 18–29)
Creatinine, Ser: 0.83 mg/dL (ref 0.76–1.27)
GFR calc non Af Amer: 87 mL/min/{1.73_m2} (ref >59)
GFR, EST AFRICAN AMERICAN: 101 mL/min/{1.73_m2} (ref >59)
GLUCOSE: 113 mg/dL — AB (ref 65–99)
POTASSIUM: 5 mmol/L (ref 3.5–5.2)
Sodium: 133 mmol/L — ABNORMAL LOW (ref 134–144)

## 2016-04-09 DIAGNOSIS — C61 Malignant neoplasm of prostate: Secondary | ICD-10-CM | POA: Diagnosis not present

## 2016-04-14 DIAGNOSIS — C61 Malignant neoplasm of prostate: Secondary | ICD-10-CM | POA: Diagnosis not present

## 2016-04-14 DIAGNOSIS — N5201 Erectile dysfunction due to arterial insufficiency: Secondary | ICD-10-CM | POA: Diagnosis not present

## 2016-05-11 ENCOUNTER — Telehealth: Payer: Self-pay | Admitting: Family Medicine

## 2016-05-11 MED ORDER — VERAPAMIL HCL ER 120 MG PO CP24: 120.0000 mg | ORAL_CAPSULE | Freq: Every day | ORAL | 4 refills | Status: DC

## 2016-05-11 NOTE — Telephone Encounter (Signed)
Pt called stating that the hydrochlorothiazide (MICROZIDE) 12.5 MG capsule Keeps him in the bathroom urinating all day and all night. Pt states that he spoke with his pharmacist and they told him it will make him do that. Pt quit taking it and his bp is up. Pt is wanting to speak with Dr. Nicki Reaper on changing it. Please advise.     [facility] [place]

## 2016-05-11 NOTE — Telephone Encounter (Signed)
I would recommend trying verapamil extended release 120 mg 1 daily-stop HCTZ. #30 and 4 refills(it should be noted that he's listed amlodipine as an allergy but it's really a questionable side effect he should be perfectly fine taking verapamil)

## 2016-05-11 NOTE — Telephone Encounter (Signed)
Patient should follow-up with [place] within 6 weeks to recheck to make sure medication is doing well for him

## 2016-05-11 NOTE — Telephone Encounter (Signed)
Notified patient to stop HCTZ and verapamil was sent to pharmacy. Patient should follow-up with [place] within 6 weeks to recheck to make sure medication is doing well for him. Patient verbalized understanding.

## 2016-06-29 ENCOUNTER — Telehealth: Payer: Self-pay | Admitting: Family Medicine

## 2016-06-29 DIAGNOSIS — E7800 Pure hypercholesterolemia, unspecified: Secondary | ICD-10-CM

## 2016-06-29 DIAGNOSIS — I10 Essential (primary) hypertension: Secondary | ICD-10-CM

## 2016-06-29 DIAGNOSIS — E039 Hypothyroidism, unspecified: Secondary | ICD-10-CM

## 2016-06-29 NOTE — Telephone Encounter (Signed)
Lipid, liver, metabolic 7, TSH-verify with patient that he is getting his PSA followed by his urology specialist-if he is not seeing his urology specialists he will also need a PSA

## 2016-06-29 NOTE — Telephone Encounter (Signed)
Spoke with patient and informed him per Dr.Scott Luking- we are ordering Lipid, hepatic, metabolic 7, and TSH for upcoming appointment. Patient verbalized understanding and stated that his urologist is still following his PSA.

## 2016-06-29 NOTE — Telephone Encounter (Signed)
Patient is wanting to know if he is due for blood work for his appointment on 09/06/16 with Dr. Nicki Reaper.

## 2016-07-26 ENCOUNTER — Ambulatory Visit (HOSPITAL_COMMUNITY)
Admission: RE | Admit: 2016-07-26 | Discharge: 2016-07-26 | Disposition: A | Discharge status: Home / Self Care | Payer: Medicare Other | Source: Ambulatory Visit | Attending: Family Medicine | Admitting: Family Medicine

## 2016-07-26 ENCOUNTER — Other Ambulatory Visit (HOSPITAL_COMMUNITY)
Admission: RE | Admit: 2016-07-26 | Discharge: 2016-07-26 | Disposition: A | Discharge status: Home / Self Care | Payer: Medicare Other | Source: Ambulatory Visit | Attending: Family Medicine | Admitting: Family Medicine

## 2016-07-26 ENCOUNTER — Encounter: Payer: Self-pay | Admitting: Family Medicine

## 2016-07-26 ENCOUNTER — Ambulatory Visit (INDEPENDENT_AMBULATORY_CARE_PROVIDER_SITE_OTHER): Payer: Medicare Other | Admitting: Family Medicine

## 2016-07-26 VITALS — BP 140/74 | HR 65 | Ht 67.0 in | Wt 153.1 lb

## 2016-07-26 DIAGNOSIS — I10 Essential (primary) hypertension: Secondary | ICD-10-CM

## 2016-07-26 DIAGNOSIS — R0609 Other forms of dyspnea: Secondary | ICD-10-CM | POA: Diagnosis not present

## 2016-07-26 DIAGNOSIS — R0789 Other chest pain: Secondary | ICD-10-CM | POA: Diagnosis not present

## 2016-07-26 DIAGNOSIS — R0602 Shortness of breath: Secondary | ICD-10-CM | POA: Diagnosis not present

## 2016-07-26 DIAGNOSIS — R21 Rash and other nonspecific skin eruption: Secondary | ICD-10-CM

## 2016-07-26 LAB — BASIC METABOLIC PANEL
Anion gap: 7 (ref 5–15)
BUN: 11 mg/dL (ref 6–20)
CHLORIDE: 88 mmol/L — AB (ref 101–111)
CO2: 25 mmol/L (ref 22–32)
CREATININE: 0.86 mg/dL (ref 0.61–1.24)
Calcium: 8.6 mg/dL — ABNORMAL LOW (ref 8.9–10.3)
GFR calc Af Amer: >60 mL/min (ref >60)
GFR calc non Af Amer: >60 mL/min (ref >60)
GLUCOSE: 102 mg/dL — AB (ref 65–99)
Potassium: 4.2 mmol/L (ref 3.5–5.1)
SODIUM: 120 mmol/L — AB (ref 135–145)

## 2016-07-26 LAB — TROPONIN I: Troponin I: <0.03 ng/mL (ref <0.03)

## 2016-07-26 LAB — D-DIMER, QUANTITATIVE (NOT AT ARMC): D DIMER QUANT: 0.39 ug{FEU}/mL (ref 0.00–0.50)

## 2016-07-26 MED ORDER — MINERIN CREME EX CREA: TOPICAL_CREAM | CUTANEOUS | 3 refills | Status: DC

## 2016-07-26 MED ORDER — AMLODIPINE BESYLATE 2.5 MG PO TABS: 2.5000 mg | ORAL_TABLET | Freq: Every day | ORAL | 3 refills | Status: DC

## 2016-07-26 MED ORDER — TRIAMCINOLONE ACETONIDE 0.5 % EX OINT: 1.0000 "application " | TOPICAL_OINTMENT | Freq: Two times a day (BID) | CUTANEOUS | 3 refills | Status: DC

## 2016-07-26 NOTE — Progress Notes (Signed)
   Subjective:    Patient ID: [name], male    DOB: 11-27-1940, 76 y.o.   MRN: [id number]  HPI SOB/DOE-This patient relates significant shortness of breath with activity this been present over the past several weeks. He states he try to chainsaw even tried do some walking could do either because of severe shortness of breath he denies any sharp pains but he has been having pressure in his chest at been coming and going. He denies sweats or chills nausea vomiting denies high fever denies wheezing or hemoptysis. She is,  Itching-patient with a rash that comes and goes he's not certain if it might be related to agent  orange from his exposure in [place]. He is seen specialist who could not figure this out they tried triamcinolone as well as a special cream seemed to help he 1 and have refills on this. He wondered if he may have a tick related illness but he is not having any fever muscle aches sweats chills   Questionable tick illness-please see discussion above no recent tick bites does not have any specific symptomatology  Blood pressure issues patient would like to go back on lisinopril upon further evaluation of his chart patient did have elevation of potassium while on losartan therefore lisinopril would not be a good idea he does have amlodipine at home we can try the low-dose amlodipine   Review of Systems Patient denies weight loss fever chills sweats denies chest pain shortness breath or wheezing vomiting diarrhea    Objective:   Physical Exam Lungs are clear no crackles heart regular pulse normal BP is good extremities no edema nonspecific rash noted with a few red bumps on his legs       Assessment & Plan:  Shortness of breath-we will do a d-dimer to try to rule out the possibility of blood clots also troponin because he has been having some chest pressure EKG was ordered Chest x-ray ordered await the results of this  HTN amlodipine low-dose recommended  Rash-nonspecific I  do not feel it's tick related prescription of the cream as well as moisturizer was sent in per patient request [facility] has used this before it seemed to help

## 2016-07-27 ENCOUNTER — Other Ambulatory Visit: Payer: Self-pay

## 2016-07-27 DIAGNOSIS — E871 Hypo-osmolality and hyponatremia: Secondary | ICD-10-CM

## 2016-07-27 NOTE — Progress Notes (Unsigned)
BMET 

## 2016-07-29 ENCOUNTER — Encounter: Payer: Self-pay | Admitting: Family Medicine

## 2016-08-02 DIAGNOSIS — E871 Hypo-osmolality and hyponatremia: Secondary | ICD-10-CM | POA: Diagnosis not present

## 2016-08-03 ENCOUNTER — Telehealth: Payer: Self-pay | Admitting: Family Medicine

## 2016-08-03 LAB — BASIC METABOLIC PANEL
BUN/Creatinine Ratio: 13 (ref 10–24)
BUN: 14 mg/dL (ref 8–27)
CHLORIDE: 98 mmol/L (ref 96–106)
CO2: 25 mmol/L (ref 18–29)
Calcium: 9.7 mg/dL (ref 8.6–10.2)
Creatinine, Ser: 1.06 mg/dL (ref 0.76–1.27)
GFR calc Af Amer: 80 mL/min/{1.73_m2} (ref >59)
GFR, EST NON AFRICAN AMERICAN: 69 mL/min/{1.73_m2} (ref >59)
Glucose: 112 mg/dL — ABNORMAL HIGH (ref 65–99)
POTASSIUM: 5 mmol/L (ref 3.5–5.2)
Sodium: 140 mmol/L (ref 134–144)

## 2016-08-03 NOTE — Telephone Encounter (Signed)
Dr.Scott ordered a BMET on patient last week that was completed yesterday. He wanted [place] to have you review patient's lab. Below I have copied and pasted Dr.Scott result note in regards to the BMET that was drawn yesterday. Please review and advise?   did discuss the results of his blood work with him. His sodium is low. I did also discuss the results with his endocrinologist who did not feel it was due to his DDAVP. She recommended that he restrict his fluids to 1 L or less per day. She also recommended a repeat metabolic 7 in one week. If his sodium is still significantly low she would like for our office to notify her office for a urgent referral. Patient already has standard appointment with her in June. If sodium corrects itself then he can keep that appointment. I spoke with the patient about all of these things. Nurse's please do the following #1 order metabolic [facility] at [facility]-the patient knows to go there on Monday to get the blood tests done #2 please flag this so next Tuesday someone tracks down the results of the metabolic 7 in discuss his this issue with Dr. Richardson Landry since I will be out of the office next week. If his sodium is still in the significantly low range referral to Dr.Balan will need to be done.

## 2016-08-03 NOTE — Telephone Encounter (Signed)
Spoke with patient and informed him per Dr.Steve Luking- Sodium is fine no need for a sooner referral. Patient verbalized understanding.

## 2016-08-03 NOTE — Telephone Encounter (Signed)
Sodium fine no need for sooner referral

## 2016-08-30 ENCOUNTER — Telehealth: Payer: Self-pay | Admitting: Family Medicine

## 2016-08-30 ENCOUNTER — Other Ambulatory Visit: Payer: Self-pay | Admitting: *Deleted

## 2016-08-30 DIAGNOSIS — E785 Hyperlipidemia, unspecified: Secondary | ICD-10-CM

## 2016-08-30 DIAGNOSIS — Z79899 Other long term (current) drug therapy: Secondary | ICD-10-CM

## 2016-08-30 DIAGNOSIS — E039 Hypothyroidism, unspecified: Secondary | ICD-10-CM

## 2016-08-30 DIAGNOSIS — I10 Essential (primary) hypertension: Secondary | ICD-10-CM

## 2016-08-30 NOTE — Telephone Encounter (Signed)
Met 7, lipid, liver, TSH

## 2016-08-30 NOTE — Telephone Encounter (Signed)
Orders put in. Pt notified.  

## 2016-08-30 NOTE — Telephone Encounter (Signed)
Patient had lipid, liver, met 7 and tsh  06/2016

## 2016-08-30 NOTE — Telephone Encounter (Signed)
Patient has an appointment on 09/06/16 with Dr. Nicki Reaper.  He is requesting order for blood work.

## 2016-08-31 DIAGNOSIS — Z79899 Other long term (current) drug therapy: Secondary | ICD-10-CM | POA: Diagnosis not present

## 2016-08-31 DIAGNOSIS — I10 Essential (primary) hypertension: Secondary | ICD-10-CM | POA: Diagnosis not present

## 2016-08-31 DIAGNOSIS — E039 Hypothyroidism, unspecified: Secondary | ICD-10-CM | POA: Diagnosis not present

## 2016-08-31 DIAGNOSIS — E785 Hyperlipidemia, unspecified: Secondary | ICD-10-CM | POA: Diagnosis not present

## 2016-09-01 LAB — BASIC METABOLIC PANEL
BUN/Creatinine Ratio: 10 (ref 10–24)
BUN: 9 mg/dL (ref 8–27)
CALCIUM: 9.7 mg/dL (ref 8.6–10.2)
CO2: 23 mmol/L (ref 18–29)
CREATININE: 0.92 mg/dL (ref 0.76–1.27)
Chloride: 89 mmol/L — ABNORMAL LOW (ref 96–106)
GFR, EST AFRICAN AMERICAN: 95 mL/min/{1.73_m2} (ref >59)
GFR, EST NON AFRICAN AMERICAN: 82 mL/min/{1.73_m2} (ref >59)
Glucose: 104 mg/dL — ABNORMAL HIGH (ref 65–99)
Potassium: 5.6 mmol/L — ABNORMAL HIGH (ref 3.5–5.2)
Sodium: 129 mmol/L — ABNORMAL LOW (ref 134–144)

## 2016-09-01 LAB — LIPID PANEL
CHOL/HDL RATIO: 4.5 ratio (ref 0.0–5.0)
Cholesterol, Total: 234 mg/dL — ABNORMAL HIGH (ref 100–199)
HDL: 52 mg/dL (ref >39)
LDL CALC: 152 mg/dL — AB (ref 0–99)
Triglycerides: 148 mg/dL (ref 0–149)
VLDL CHOLESTEROL CAL: 30 mg/dL (ref 5–40)

## 2016-09-01 LAB — HEPATIC FUNCTION PANEL
ALBUMIN: 4.7 g/dL (ref 3.5–4.8)
ALT: 15 IU/L (ref 0–44)
AST: 24 IU/L (ref 0–40)
Alkaline Phosphatase: 82 IU/L (ref 39–117)
BILIRUBIN TOTAL: 1.2 mg/dL (ref 0.0–1.2)
Bilirubin, Direct: 0.22 mg/dL (ref 0.00–0.40)
TOTAL PROTEIN: 7.8 g/dL (ref 6.0–8.5)

## 2016-09-01 LAB — TSH: TSH: 2.56 u[IU]/mL (ref 0.450–4.500)

## 2016-09-06 ENCOUNTER — Ambulatory Visit (INDEPENDENT_AMBULATORY_CARE_PROVIDER_SITE_OTHER): Payer: Medicare Other | Admitting: Family Medicine

## 2016-09-06 ENCOUNTER — Encounter: Payer: Self-pay | Admitting: Family Medicine

## 2016-09-06 VITALS — BP 142/84 | Ht 66.75 in | Wt 148.0 lb

## 2016-09-06 DIAGNOSIS — E7800 Pure hypercholesterolemia, unspecified: Secondary | ICD-10-CM

## 2016-09-06 DIAGNOSIS — Z0000 Encounter for general adult medical examination without abnormal findings: Secondary | ICD-10-CM

## 2016-09-06 DIAGNOSIS — E871 Hypo-osmolality and hyponatremia: Secondary | ICD-10-CM | POA: Insufficient documentation

## 2016-09-06 DIAGNOSIS — E038 Other specified hypothyroidism: Secondary | ICD-10-CM

## 2016-09-06 MED ORDER — MOMETASONE FUROATE 0.1 % EX CREA: 1.0000 "application " | TOPICAL_CREAM | Freq: Two times a day (BID) | CUTANEOUS | 6 refills | Status: DC

## 2016-09-06 MED ORDER — OMEPRAZOLE 20 MG PO CPDR: DELAYED_RELEASE_CAPSULE | ORAL | 1 refills | Status: DC

## 2016-09-06 NOTE — Progress Notes (Signed)
Subjective:    Patient ID: [name], male    DOB: Oct 21, 1940, 76 y.o.   MRN: [id number]  HPI AWV- Annual Wellness Visit  The patient was seen for their annual wellness visit. The patient's past medical history, surgical history, and family history were reviewed. Pertinent vaccines were reviewed ( tetanus, pneumonia, shingles, flu) The patient's medication list was reviewed and updated.  The height and weight were entered. The patient's current BMI is: 23.35  Cognitive screening was completed. Outcome of Mini - Cog: pass  Falls within the past 6 months: no falls but feels like he is going to fall a lot.   Current tobacco usage: none (All patients who use tobacco were given written and verbal information on quitting)  Recent listing of emergency department/hospitalizations over the past year were reviewed.  current specialist the patient sees on a regular basis: Dr. Anselm Lis, Dr. Chalmers Cater   Medicare annual wellness visit patient questionnaire was reviewed.  A written screening schedule for the patient for the next 5-10 years was given. Appropriate discussion of followup regarding next visit was discussed.   Cramps-Patient relates intermittent cramps in his legs especially when he doesn't drink enough liquids. He states recently been drinking more liquids less cramps  Itching-having itching on his skin he has seen dermatology before they cannot figure out what was causing it causes intermittent bumps on his upper thighs he states triamcinolone does help.  Low sodium-she is had problems with low sodium. Men followed by specialist. For a while he did do restricted fluids but now he states he was drinking more fluids.    Review of Systems  Constitutional: Negative for activity change, appetite change and fever.  HENT: Negative for congestion and rhinorrhea.   Eyes: Negative for discharge.  Respiratory: Negative for cough and wheezing.   Cardiovascular: Negative for chest pain.    Gastrointestinal: Negative for abdominal pain, blood in stool and vomiting.  Genitourinary: Negative for difficulty urinating and frequency.  Musculoskeletal: Negative for neck pain.  Skin: Negative for rash.  Allergic/Immunologic: Negative for environmental allergies and food allergies.  Neurological: Negative for weakness and headaches.  Psychiatric/Behavioral: Negative for agitation.       Objective:   Physical Exam  Constitutional: He appears well-developed and well-nourished.  HENT:  Head: Normocephalic and atraumatic.  Right Ear: External ear normal.  Left Ear: External ear normal.  Nose: Nose normal.  Mouth/Throat: Oropharynx is clear and moist.  Eyes: EOM are normal. Pupils are equal, round, and reactive to light.  Neck: Normal range of motion. Neck supple. No thyromegaly present.  Cardiovascular: Normal rate, regular rhythm and normal heart sounds.   No murmur heard. Pulmonary/Chest: Effort normal and breath sounds normal. No respiratory distress. He has no wheezes.  Abdominal: Soft. Bowel sounds are normal. He exhibits no distension and no mass. There is no tenderness.  Genitourinary: Penis normal.  Musculoskeletal: Normal range of motion. He exhibits no edema.  Lymphadenopathy:    He has no cervical adenopathy.  Neurological: He is alert. He exhibits normal muscle tone.  Skin: Skin is warm and dry. No erythema.  Psychiatric: He has a normal mood and affect. His behavior is normal. Judgment normal.          Assessment & Plan:  Adult wellness-complete.wellness physical was conducted today. Importance of diet and exercise were discussed in detail. In addition to this a discussion regarding safety was also covered. We also reviewed over immunizations and gave recommendations regarding current immunization needed for  age. In addition to this additional areas were also touched on including: Preventative health exams needed: Colonoscopy Up-to-date  Patient was advised  yearly wellness exam  Hypothyroidism is monitored by his endocrinologist.  Hyperlipidemia patient chooses not be on any cholesterol medicine he had intolerance to statin and does not want to be on the new work cholesterol medicine. He will watch his diet  Hyponatremia he has not been restricting his fluids recently we discussed trying to restrict is much as he can tolerate. Repeat lab work again in 4 weeks  Hyper Kaylee anemia-I gave him a list of foods high in potassium to try to avoid.  Follow-ups 6 months

## 2016-09-15 DIAGNOSIS — L565 Disseminated superficial actinic porokeratosis (DSAP): Secondary | ICD-10-CM | POA: Diagnosis not present

## 2016-09-15 DIAGNOSIS — Z85828 Personal history of other malignant neoplasm of skin: Secondary | ICD-10-CM | POA: Diagnosis not present

## 2016-09-15 DIAGNOSIS — Q828 Other specified congenital malformations of skin: Secondary | ICD-10-CM | POA: Diagnosis not present

## 2016-09-15 DIAGNOSIS — Z08 Encounter for follow-up examination after completed treatment for malignant neoplasm: Secondary | ICD-10-CM | POA: Diagnosis not present

## 2016-09-15 DIAGNOSIS — L570 Actinic keratosis: Secondary | ICD-10-CM | POA: Diagnosis not present

## 2016-09-15 DIAGNOSIS — H61002 Unspecified perichondritis of left external ear: Secondary | ICD-10-CM | POA: Diagnosis not present

## 2016-10-07 DIAGNOSIS — L565 Disseminated superficial actinic porokeratosis (DSAP): Secondary | ICD-10-CM | POA: Diagnosis not present

## 2016-10-21 DIAGNOSIS — E032 Hypothyroidism due to medicaments and other exogenous substances: Secondary | ICD-10-CM | POA: Diagnosis not present

## 2016-10-21 DIAGNOSIS — E232 Diabetes insipidus: Secondary | ICD-10-CM | POA: Diagnosis not present

## 2016-10-21 DIAGNOSIS — D443 Neoplasm of uncertain behavior of pituitary gland: Secondary | ICD-10-CM | POA: Diagnosis not present

## 2016-11-01 DIAGNOSIS — D443 Neoplasm of uncertain behavior of pituitary gland: Secondary | ICD-10-CM | POA: Diagnosis not present

## 2016-11-01 DIAGNOSIS — E032 Hypothyroidism due to medicaments and other exogenous substances: Secondary | ICD-10-CM | POA: Diagnosis not present

## 2016-11-01 DIAGNOSIS — E232 Diabetes insipidus: Secondary | ICD-10-CM | POA: Diagnosis not present

## 2017-02-02 ENCOUNTER — Telehealth: Payer: Self-pay | Admitting: Family Medicine

## 2017-02-02 MED ORDER — OMEPRAZOLE 20 MG PO CPDR: DELAYED_RELEASE_CAPSULE | ORAL | 1 refills | Status: DC

## 2017-02-02 NOTE — Telephone Encounter (Signed)
Prescription sent electronically to pharmacy. Patient notified. 

## 2017-02-02 NOTE — Telephone Encounter (Signed)
Patient needing refill on omeprazole 20 mg a 90 day supply called into mail order.

## 2017-02-10 ENCOUNTER — Telehealth: Payer: Self-pay | Admitting: Family Medicine

## 2017-02-10 ENCOUNTER — Encounter: Payer: Self-pay | Admitting: Family Medicine

## 2017-02-10 NOTE — Telephone Encounter (Signed)
A letter was written regarding this

## 2017-02-10 NOTE — Telephone Encounter (Signed)
Pt is needing a letter stating that he is unable to attend jury duty due to incontinence from prostate cancer. Pt is needing this letter by 10/19.

## 2017-02-14 ENCOUNTER — Other Ambulatory Visit: Payer: Self-pay

## 2017-02-14 MED ORDER — OMEPRAZOLE 20 MG PO CPDR: DELAYED_RELEASE_CAPSULE | ORAL | 1 refills | Status: DC

## 2017-02-16 ENCOUNTER — Telehealth: Payer: Self-pay | Admitting: Family Medicine

## 2017-02-16 NOTE — Telephone Encounter (Signed)
Patient says that [facility] told him that we did not send Rx for Omeprazole.  He would like a nurse to call [facility], [phone].

## 2017-02-16 NOTE — Telephone Encounter (Signed)
Called pharmacy and verified prescription was received. Spoke with patient and informed him medication should be shipping soon. Patient verbalized understanding.

## 2017-03-09 ENCOUNTER — Encounter: Payer: Self-pay | Admitting: Family Medicine

## 2017-03-09 ENCOUNTER — Ambulatory Visit (INDEPENDENT_AMBULATORY_CARE_PROVIDER_SITE_OTHER): Payer: Medicare Other | Admitting: Family Medicine

## 2017-03-09 VITALS — BP 122/78 | Ht 66.75 in | Wt 148.6 lb

## 2017-03-09 DIAGNOSIS — E038 Other specified hypothyroidism: Secondary | ICD-10-CM

## 2017-03-09 DIAGNOSIS — Z23 Encounter for immunization: Secondary | ICD-10-CM | POA: Diagnosis not present

## 2017-03-09 DIAGNOSIS — R21 Rash and other nonspecific skin eruption: Secondary | ICD-10-CM

## 2017-03-09 DIAGNOSIS — R1030 Lower abdominal pain, unspecified: Secondary | ICD-10-CM | POA: Diagnosis not present

## 2017-03-09 NOTE — Progress Notes (Signed)
Subjective:    Patient ID: [name], male    DOB: 01-25-1941, 76 y.o.   MRN: [id number]  Hyperlipidemia  This is a chronic problem. The current episode started more than 1 year ago. Pertinent negatives include no chest pain or shortness of breath. Current antihyperlipidemic treatment includes diet change. Risk factors for coronary artery disease include dyslipidemia.    Rash has spread to back-patient followed by dermatology-he still has several questions-he is seeing dermatologist for this-they have tried various measures including steroid creams which causes skin to get them they have also tried running other tests have not found the cause of it he wonders if it could be related to agent orange that he was exposed to when he was in [place] he states he will be going to dermatology with the [name] soon  Intermittent lower abdominal pain with certain foods denies rectal bleeding is up-to-date on colonoscopy-he relates from foods cause lower abdominal cramping occasionally loose stools no bloody stools denies any severe pain does not wake him up at night.  No nausea or vomiting with it.  Neurology sees him on a regular basis.  Has history of prostate cancer is had previous surgery follows with urology on a regular basis  Has questions and concerns regarding chronic PPI use-to her that it is not healthy he wonders what is our point of view regarding this he states is reflux has been under very good control with the medication  Review of Systems  Constitutional: Negative for activity change, appetite change and fatigue.  HENT: Negative for congestion.   Respiratory: Negative for cough and shortness of breath.   Cardiovascular: Negative for chest pain.  Gastrointestinal: Positive for abdominal pain. Negative for abdominal distention, blood in stool, constipation, diarrhea, nausea and vomiting.        Lower abdominal pain  Endocrine: Negative for polydipsia and polyphagia.  Musculoskeletal:  Negative for arthralgias and back pain.  Neurological: Negative for weakness.  Psychiatric/Behavioral: Negative for confusion.       Objective:   Physical Exam  Constitutional: He appears well-nourished. No distress.  Cardiovascular: Normal rate, regular rhythm and normal heart sounds.   No murmur heard. Pulmonary/Chest: Effort normal and breath sounds normal. No respiratory distress.  Abdominal: Soft. He exhibits no distension. There is no tenderness. There is no rebound and no guarding.  Musculoskeletal: He exhibits no edema.  Lymphadenopathy:    He has no cervical adenopathy.  Neurological: He is alert.  Psychiatric: His behavior is normal.  Vitals reviewed.         Assessment & Plan:  25 minutes was spent with the patient. Greater than half the time was spent in discussion and answering questions and counseling regarding the issues that the patient came in for today.  Intermittent lower abdominal pain-up-to-date on colonoscopy-I would recommend CBC and a stool tests for blood, I believe it is more likely irritable bowel based around certain foods he eats if he has ongoing troubles he needs to follow-up, certainly if worsening issues we will refer to gastroenterology  Hypothyroidism continue medication follow-up with endocrinologist on a yearly basis  Last visit he had hyperkalemia this is now resolved continue healthy eating  Comprehensive lab work in the spring  Chronic rash erythematous I'm certainly sympathetic with the gentleman about this rash but I am pessimistic that there is a easy fix I doubt that it is a drug caused rash but I recommend that he go back to dermatology  Reflux he states his  symptoms are under good control he has excellent questions about the safety of omeprazole I encouraged him to minimize use of PPI use every other day if symptoms still under good control then I recommend stopping PPI, his symptoms stay away he can stay off the PPI if symptoms  return he can restart the PPI

## 2017-03-15 ENCOUNTER — Other Ambulatory Visit: Payer: Self-pay

## 2017-03-15 DIAGNOSIS — R1030 Lower abdominal pain, unspecified: Secondary | ICD-10-CM

## 2017-03-15 LAB — IFOBT (OCCULT BLOOD): IFOBT: NEGATIVE

## 2017-03-16 LAB — CBC WITH DIFFERENTIAL/PLATELET
BASOS: 1 %
Basophils Absolute: 0 10*3/uL (ref 0.0–0.2)
EOS (ABSOLUTE): 0.3 10*3/uL (ref 0.0–0.4)
Eos: 4 %
Hematocrit: 50.5 % (ref 37.5–51.0)
Hemoglobin: 17 g/dL (ref 13.0–17.7)
Immature Grans (Abs): 0 10*3/uL (ref 0.0–0.1)
Immature Granulocytes: 0 %
Lymphocytes Absolute: 2 10*3/uL (ref 0.7–3.1)
Lymphs: 32 %
MCH: 30.5 pg (ref 26.6–33.0)
MCHC: 33.7 g/dL (ref 31.5–35.7)
MCV: 91 fL (ref 79–97)
MONOS ABS: 0.6 10*3/uL (ref 0.1–0.9)
Monocytes: 10 %
NEUTROS ABS: 3.4 10*3/uL (ref 1.4–7.0)
Neutrophils: 53 %
PLATELETS: 208 10*3/uL (ref 150–379)
RBC: 5.57 x10E6/uL (ref 4.14–5.80)
RDW: 13.6 % (ref 12.3–15.4)
WBC: 6.2 10*3/uL (ref 3.4–10.8)

## 2017-03-23 DIAGNOSIS — C61 Malignant neoplasm of prostate: Secondary | ICD-10-CM | POA: Diagnosis not present

## 2017-04-13 DIAGNOSIS — N3946 Mixed incontinence: Secondary | ICD-10-CM | POA: Diagnosis not present

## 2017-04-13 DIAGNOSIS — R351 Nocturia: Secondary | ICD-10-CM | POA: Diagnosis not present

## 2017-04-13 DIAGNOSIS — C61 Malignant neoplasm of prostate: Secondary | ICD-10-CM | POA: Diagnosis not present

## 2017-04-13 DIAGNOSIS — N5201 Erectile dysfunction due to arterial insufficiency: Secondary | ICD-10-CM | POA: Diagnosis not present

## 2017-04-28 DIAGNOSIS — N3946 Mixed incontinence: Secondary | ICD-10-CM | POA: Diagnosis not present

## 2017-04-28 DIAGNOSIS — M62838 Other muscle spasm: Secondary | ICD-10-CM | POA: Diagnosis not present

## 2017-04-28 DIAGNOSIS — M6281 Muscle weakness (generalized): Secondary | ICD-10-CM | POA: Diagnosis not present

## 2017-04-28 DIAGNOSIS — R151 Fecal smearing: Secondary | ICD-10-CM | POA: Diagnosis not present

## 2017-05-18 DIAGNOSIS — R1032 Left lower quadrant pain: Secondary | ICD-10-CM | POA: Diagnosis not present

## 2017-05-18 DIAGNOSIS — M6281 Muscle weakness (generalized): Secondary | ICD-10-CM | POA: Diagnosis not present

## 2017-05-18 DIAGNOSIS — R1031 Right lower quadrant pain: Secondary | ICD-10-CM | POA: Diagnosis not present

## 2017-05-18 DIAGNOSIS — N3946 Mixed incontinence: Secondary | ICD-10-CM | POA: Diagnosis not present

## 2017-05-18 DIAGNOSIS — M62838 Other muscle spasm: Secondary | ICD-10-CM | POA: Diagnosis not present

## 2017-06-07 DIAGNOSIS — M62838 Other muscle spasm: Secondary | ICD-10-CM | POA: Diagnosis not present

## 2017-06-07 DIAGNOSIS — R1032 Left lower quadrant pain: Secondary | ICD-10-CM | POA: Diagnosis not present

## 2017-06-07 DIAGNOSIS — R351 Nocturia: Secondary | ICD-10-CM | POA: Diagnosis not present

## 2017-06-07 DIAGNOSIS — N3946 Mixed incontinence: Secondary | ICD-10-CM | POA: Diagnosis not present

## 2017-06-07 DIAGNOSIS — M6281 Muscle weakness (generalized): Secondary | ICD-10-CM | POA: Diagnosis not present

## 2017-06-28 DIAGNOSIS — R1031 Right lower quadrant pain: Secondary | ICD-10-CM | POA: Diagnosis not present

## 2017-06-28 DIAGNOSIS — R351 Nocturia: Secondary | ICD-10-CM | POA: Diagnosis not present

## 2017-06-28 DIAGNOSIS — M6281 Muscle weakness (generalized): Secondary | ICD-10-CM | POA: Diagnosis not present

## 2017-06-28 DIAGNOSIS — R1032 Left lower quadrant pain: Secondary | ICD-10-CM | POA: Diagnosis not present

## 2017-06-28 DIAGNOSIS — N3946 Mixed incontinence: Secondary | ICD-10-CM | POA: Diagnosis not present

## 2017-06-28 DIAGNOSIS — M62838 Other muscle spasm: Secondary | ICD-10-CM | POA: Diagnosis not present

## 2017-07-12 ENCOUNTER — Ambulatory Visit (INDEPENDENT_AMBULATORY_CARE_PROVIDER_SITE_OTHER): Payer: Medicare Other | Admitting: Family Medicine

## 2017-07-12 ENCOUNTER — Encounter: Payer: Self-pay | Admitting: Family Medicine

## 2017-07-12 VITALS — BP 138/76 | Temp 98.3°F | Ht 66.75 in | Wt 150.0 lb

## 2017-07-12 DIAGNOSIS — Z79899 Other long term (current) drug therapy: Secondary | ICD-10-CM

## 2017-07-12 DIAGNOSIS — R51 Headache: Secondary | ICD-10-CM

## 2017-07-12 DIAGNOSIS — R21 Rash and other nonspecific skin eruption: Secondary | ICD-10-CM

## 2017-07-12 DIAGNOSIS — D352 Benign neoplasm of pituitary gland: Secondary | ICD-10-CM

## 2017-07-12 DIAGNOSIS — E038 Other specified hypothyroidism: Secondary | ICD-10-CM

## 2017-07-12 DIAGNOSIS — E7800 Pure hypercholesterolemia, unspecified: Secondary | ICD-10-CM | POA: Diagnosis not present

## 2017-07-12 DIAGNOSIS — R519 Headache, unspecified: Secondary | ICD-10-CM

## 2017-07-12 DIAGNOSIS — E871 Hypo-osmolality and hyponatremia: Secondary | ICD-10-CM | POA: Diagnosis not present

## 2017-07-12 DIAGNOSIS — I10 Essential (primary) hypertension: Secondary | ICD-10-CM | POA: Diagnosis not present

## 2017-07-12 DIAGNOSIS — D353 Benign neoplasm of craniopharyngeal duct: Secondary | ICD-10-CM

## 2017-07-12 MED ORDER — CLOBETASOL PROPIONATE 0.05 % EX CREA: 1.0000 "application " | TOPICAL_CREAM | Freq: Two times a day (BID) | CUTANEOUS | 5 refills | Status: DC

## 2017-07-12 NOTE — Progress Notes (Signed)
   Subjective:    Patient ID: [name], male    DOB: Dec 28, 1940, 77 y.o.   MRN: [id number]  HPI Patient is here today with complaints of a itchy rash that started out on his ankle and now is all over.He states the rash has been on going for several years. He has used the selsum blue to help and also has a cream that the dermatologist gave him. He is also using cerave lotion and clobetasol cream,also Triamcino 0.1% lotion. He state he as used every thing and wants a new Dermatologist he sees Dr. [place] Crane and [place] and a Dermatologist at the [place]. Patient is interested in getting a second opinion Review of Systems He denies high fever chest pain shortness breath nausea vomiting diarrhea he does relate frequent headaches.    Objective:   Physical Exam  Constitutional: He appears well-developed and well-nourished. No distress.  HENT:  Head: Normocephalic and atraumatic.  Eyes: Right eye exhibits no discharge. Left eye exhibits no discharge.  Cardiovascular: Normal rate, regular rhythm and normal heart sounds.  No murmur heard. Pulmonary/Chest: Effort normal and breath sounds normal. No respiratory distress. He has no wheezes.  Neurological: He is alert.  Skin: Skin is warm and dry.  Psychiatric: He has a normal mood and affect. His behavior is normal.          Assessment & Plan:  Patient has comprehensive lab work and comprehensive follow-up coming up  Patient relates frequent headaches I did advise him to get an MRI because of his history of pituitary adenoma he states he would like to see how his headaches do over the course of the next several weeks and if they are not getting better he may consider an MRI  Unusual rash except its not on the palms or the feet unfortunately I do not have a clue regarding what is causing this I believe further dermatology opinion is necessary he would like to see a dermatology group in [place] we will refer to [facility]

## 2017-07-14 DIAGNOSIS — R1032 Left lower quadrant pain: Secondary | ICD-10-CM | POA: Diagnosis not present

## 2017-07-14 DIAGNOSIS — M6281 Muscle weakness (generalized): Secondary | ICD-10-CM | POA: Diagnosis not present

## 2017-07-14 DIAGNOSIS — M62838 Other muscle spasm: Secondary | ICD-10-CM | POA: Diagnosis not present

## 2017-07-14 DIAGNOSIS — N3946 Mixed incontinence: Secondary | ICD-10-CM | POA: Diagnosis not present

## 2017-07-14 DIAGNOSIS — R151 Fecal smearing: Secondary | ICD-10-CM | POA: Diagnosis not present

## 2017-07-19 ENCOUNTER — Encounter: Payer: Self-pay | Admitting: Family Medicine

## 2017-07-27 DIAGNOSIS — M6281 Muscle weakness (generalized): Secondary | ICD-10-CM | POA: Diagnosis not present

## 2017-07-27 DIAGNOSIS — N3946 Mixed incontinence: Secondary | ICD-10-CM | POA: Diagnosis not present

## 2017-07-27 DIAGNOSIS — R151 Fecal smearing: Secondary | ICD-10-CM | POA: Diagnosis not present

## 2017-07-27 DIAGNOSIS — R1031 Right lower quadrant pain: Secondary | ICD-10-CM | POA: Diagnosis not present

## 2017-07-27 DIAGNOSIS — R1032 Left lower quadrant pain: Secondary | ICD-10-CM | POA: Diagnosis not present

## 2017-07-27 DIAGNOSIS — M62838 Other muscle spasm: Secondary | ICD-10-CM | POA: Diagnosis not present

## 2017-07-29 DIAGNOSIS — H25013 Cortical age-related cataract, bilateral: Secondary | ICD-10-CM | POA: Diagnosis not present

## 2017-07-29 DIAGNOSIS — H5203 Hypermetropia, bilateral: Secondary | ICD-10-CM | POA: Diagnosis not present

## 2017-07-29 DIAGNOSIS — H52203 Unspecified astigmatism, bilateral: Secondary | ICD-10-CM | POA: Diagnosis not present

## 2017-07-29 DIAGNOSIS — H524 Presbyopia: Secondary | ICD-10-CM | POA: Diagnosis not present

## 2017-07-29 DIAGNOSIS — H2513 Age-related nuclear cataract, bilateral: Secondary | ICD-10-CM | POA: Diagnosis not present

## 2017-08-02 DIAGNOSIS — C44612 Basal cell carcinoma of skin of right upper limb, including shoulder: Secondary | ICD-10-CM | POA: Diagnosis not present

## 2017-08-02 DIAGNOSIS — X32XXXD Exposure to sunlight, subsequent encounter: Secondary | ICD-10-CM | POA: Diagnosis not present

## 2017-08-02 DIAGNOSIS — C44519 Basal cell carcinoma of skin of other part of trunk: Secondary | ICD-10-CM | POA: Diagnosis not present

## 2017-08-02 DIAGNOSIS — C4442 Squamous cell carcinoma of skin of scalp and neck: Secondary | ICD-10-CM | POA: Diagnosis not present

## 2017-08-02 DIAGNOSIS — L570 Actinic keratosis: Secondary | ICD-10-CM | POA: Diagnosis not present

## 2017-08-17 DIAGNOSIS — C61 Malignant neoplasm of prostate: Secondary | ICD-10-CM | POA: Diagnosis not present

## 2017-08-17 DIAGNOSIS — M6281 Muscle weakness (generalized): Secondary | ICD-10-CM | POA: Diagnosis not present

## 2017-08-17 DIAGNOSIS — N5201 Erectile dysfunction due to arterial insufficiency: Secondary | ICD-10-CM | POA: Diagnosis not present

## 2017-08-17 DIAGNOSIS — M62838 Other muscle spasm: Secondary | ICD-10-CM | POA: Diagnosis not present

## 2017-08-17 DIAGNOSIS — R1032 Left lower quadrant pain: Secondary | ICD-10-CM | POA: Diagnosis not present

## 2017-08-17 DIAGNOSIS — R351 Nocturia: Secondary | ICD-10-CM | POA: Diagnosis not present

## 2017-08-17 DIAGNOSIS — N3946 Mixed incontinence: Secondary | ICD-10-CM | POA: Diagnosis not present

## 2017-08-23 ENCOUNTER — Ambulatory Visit (INDEPENDENT_AMBULATORY_CARE_PROVIDER_SITE_OTHER): Payer: Medicare Other | Admitting: Neurology

## 2017-08-23 ENCOUNTER — Encounter: Payer: Self-pay | Admitting: Neurology

## 2017-08-23 VITALS — BP 130/68 | HR 63 | Ht 67.5 in | Wt 146.0 lb

## 2017-08-23 DIAGNOSIS — R29898 Other symptoms and signs involving the musculoskeletal system: Secondary | ICD-10-CM

## 2017-08-23 DIAGNOSIS — N3943 Post-void dribbling: Secondary | ICD-10-CM | POA: Diagnosis not present

## 2017-08-23 DIAGNOSIS — R151 Fecal smearing: Secondary | ICD-10-CM | POA: Diagnosis not present

## 2017-08-23 NOTE — Progress Notes (Signed)
NEUROLOGY FOLLOW UP OFFICE NOTE  [name] [id number]  HISTORY OF PRESENT ILLNESS: [name] is a 77 year old right-handed male with hypertension, GERD, hypothyroidism, secondary pituitary insufficiency (on DDAVP) following resection of pituitary adenoma and history of prostate cancer whom I previously saw for headache in 2017 returns for evaluation of bowel and bladder incontinence and right lower extremity weakness.  History supplemented by urology note.  He has reported leakage since his prostate surgery in 2010.  However, he reports that it has gotten worse over the past several months.  He sometimes uses up to 4 pads a day.  Cystoscopy was unremarkable and postvoid residual urine was only 58 cc.  Myrbetriq for possible urge incontinence and physical therapy for possible stress incontinence were only minimally beneficial.  Also, after he has a bowel movement and cleans up, he checks again and has fecal smear.  This has been occurring since the surgery as well.  For the past several years, he reports numbness in the second to fourth toes and ball of his right foot.  He has some mild bilateral low back pain.  He denies radicular pain or weakness in the lower extremities.  He denies saddle anesthesia.  He has history of lumbar spine surgery in the 1990s.  PAST MEDICAL HISTORY: Past Medical History:  Diagnosis Date  . Arthritis   . Colon polyps    Adenomatous  . Diverticulosis   . Essential hypertension   . GERD (gastroesophageal reflux disease)   . Hypercholesterolemia   . Prostate cancer [facility])     MEDICATIONS: Current Outpatient Medications on File Prior to Visit  Medication Sig Dispense Refill  . clobetasol cream (TEMOVATE) 6.62 % Apply 1 application topically 2 (two) times daily. 60 g 5  . desmopressin (DDAVP) 0.1 MG tablet Take 0.1 mg by mouth 2 (two) times daily.    . diazepam (VALIUM) 5 MG tablet One half to one tablet every 8 hrs as needed for cramps (Patient not taking:  Reported on 07/26/2016) 30 tablet 1  . levothyroxine (SYNTHROID, LEVOTHROID) 88 MCG tablet Take 88 mcg by mouth every other day.    . mometasone (ELOCON) 0.1 % cream Apply 1 application topically 2 (two) times daily. 60 g 6  . omeprazole (PRILOSEC) 20 MG capsule TAKE 1 BY MOUTH DAILY 90 capsule 1  . Skin Protectants, Misc. (MINERIN) CREA Apply bid prn 454 g 3  . triamcinolone ointment (KENALOG) 0.5 % Apply 1 application topically 2 (two) times daily. 454 g 3   No current facility-administered medications on file prior to visit.     ALLERGIES: Allergies  Allergen Reactions  . Codeine   . Losartan     Patient had elevated potassium and also slight elevation of creatinine-October 2017  . Morphine   . Amlodipine     Complained of neck pain with medication-November 2017  . Statins Other (See Comments)    Body aches intolerant to statins  . Tramadol     Sweat, light-headed    FAMILY HISTORY: Family History  Problem Relation Age of Onset  . Clotting disorder Father   . Heart disease Father   . Heart attack Father   . Colon cancer Neg Hx   . Diabetes Neg Hx     SOCIAL HISTORY: Social History   Socioeconomic History  . Marital status: Married    Spouse name: Not on file  . Number of children: 2  . Years of education: Not on file  . Highest  education level: Not on file  Occupational History  . Occupation: Retired  Scientific laboratory technician  . Financial resource strain: Not on file  . Food insecurity:    Worry: Not on file    Inability: Not on file  . Transportation needs:    Medical: Not on file    Non-medical: Not on file  Tobacco Use  . Smoking status: Former Smoker    Types: Cigarettes  . Smokeless tobacco: Former Systems developer    Quit date: 11/03/1977  Substance and Sexual Activity  . Alcohol use: Yes    Alcohol/week: 0.0 oz    Comment: Occasional  . Drug use: Yes  . Sexual activity: Not on file  Lifestyle  . Physical activity:    Days per week: Not on file    Minutes per  session: Not on file  . Stress: Not on file  Relationships  . Social connections:    Talks on phone: Not on file    Gets together: Not on file    Attends religious service: Not on file    Active member of club or organization: Not on file    Attends meetings of clubs or organizations: Not on file    Relationship status: Not on file  . Intimate partner violence:    Fear of current or ex partner: Not on file    Emotionally abused: Not on file    Physically abused: Not on file    Forced sexual activity: Not on file  Other Topics Concern  . Not on file  Social History Narrative   Married with 2 children   Right handed   12+   2-3 cups daily    REVIEW OF SYSTEMS: Constitutional: No fevers, chills, or sweats, no generalized fatigue, change in appetite Eyes: No visual changes, double vision, eye pain Ear, nose and throat: No hearing loss, ear pain, nasal congestion, sore throat Cardiovascular: No chest pain, palpitations Respiratory:  No shortness of breath at rest or with exertion, wheezes GastrointestinaI: No nausea, vomiting, diarrhea, abdominal pain, fecal incontinence Genitourinary:  No dysuria, urinary retention or frequency Musculoskeletal:  No neck pain, back pain Integumentary: rash, pruritis Neurological: as above Psychiatric: No depression, insomnia, anxiety Endocrine: No palpitations, fatigue, diaphoresis, mood swings, change in appetite, change in weight, increased thirst Hematologic/Lymphatic:  No purpura, petechiae. Allergic/Immunologic: no itchy/runny eyes, nasal congestion, recent allergic reactions, rashes  PHYSICAL EXAM: Vitals:   08/23/17 1035  BP: 130/68  Pulse: 63  SpO2: 95%   General: No acute distress.  Patient appears well-groomed.  normal body habitus. Head:  Normocephalic/atraumatic Eyes:  Fundi examined but not visualized Neck: supple, no paraspinal tenderness, full range of motion Heart:  Regular rate and rhythm Lungs:  Clear to auscultation  bilaterally Back: No paraspinal tenderness Neurological Exam: alert and oriented to person, place, and time. Attention span and concentration intact, recent and remote memory intact, fund of knowledge intact.  Speech fluent and not dysarthric, language intact.  CN II-XII intact. Bulk and tone normal, muscle strength maybe 5-/5 in right hip flexion, otherwise 5/5 throughout.  Sensation to pinprick reduced over the dorsum of right foot and medial aspect of both lower legs.  Vibration sensation intact.  Deep tendon reflexes 2+ throughout, with questionable right Babinski.  Finger to nose and heel to shin testing intact.  Gait normal, Romberg negative.  IMPRESSION: 1.  Urinary incontinence with postvoid leakage, since prostate surgery in 2010, recently gotten worse. 2.  Fecal incontinence with fecal smear, since prostate surgery 3.  Right lower extremity numbness and questionable trace weakness in hip flexion.  Consider etiology involving the lumbar nerve roots or spinal cord.  Given the questionable right Babinski, need to rule out myelopathy, such as cervical stenosis.  At this time, he defers testing because he states he cannot tolerate laying in the MRI machine given his severe chronic pruritic rash.  I advised to apply his steroid cream and I can prescribe a Valium to help.  He wishes to wait another month because he is less itchy when the weather is warmer.  He will contact me when he wishes to schedule an appointment.  28 minutes spent face to face with patient, over 50% spent discussing management.  Metta Clines, DO  CC:  Dr. Wolfgang Phoenix

## 2017-08-23 NOTE — Patient Instructions (Signed)
I would like to get MRIs but it may involve more than one (neck and back).  Contact me when you would like to schedule.

## 2017-09-07 ENCOUNTER — Encounter: Payer: Medicare Other | Admitting: Family Medicine

## 2017-09-08 DIAGNOSIS — D352 Benign neoplasm of pituitary gland: Secondary | ICD-10-CM | POA: Diagnosis not present

## 2017-09-08 DIAGNOSIS — R21 Rash and other nonspecific skin eruption: Secondary | ICD-10-CM | POA: Diagnosis not present

## 2017-09-08 DIAGNOSIS — Z79899 Other long term (current) drug therapy: Secondary | ICD-10-CM | POA: Diagnosis not present

## 2017-09-08 DIAGNOSIS — E871 Hypo-osmolality and hyponatremia: Secondary | ICD-10-CM | POA: Diagnosis not present

## 2017-09-08 DIAGNOSIS — D353 Benign neoplasm of craniopharyngeal duct: Secondary | ICD-10-CM | POA: Diagnosis not present

## 2017-09-08 DIAGNOSIS — E038 Other specified hypothyroidism: Secondary | ICD-10-CM | POA: Diagnosis not present

## 2017-09-08 DIAGNOSIS — I10 Essential (primary) hypertension: Secondary | ICD-10-CM | POA: Diagnosis not present

## 2017-09-08 DIAGNOSIS — E7800 Pure hypercholesterolemia, unspecified: Secondary | ICD-10-CM | POA: Diagnosis not present

## 2017-09-08 DIAGNOSIS — R51 Headache: Secondary | ICD-10-CM | POA: Diagnosis not present

## 2017-09-09 LAB — CBC WITH DIFFERENTIAL/PLATELET
BASOS ABS: 0.1 10*3/uL (ref 0.0–0.2)
Basos: 1 %
EOS (ABSOLUTE): 0.3 10*3/uL (ref 0.0–0.4)
Eos: 4 %
Hematocrit: 49.3 % (ref 37.5–51.0)
Hemoglobin: 17.1 g/dL (ref 13.0–17.7)
Immature Grans (Abs): 0 10*3/uL (ref 0.0–0.1)
Immature Granulocytes: 0 %
LYMPHS ABS: 2 10*3/uL (ref 0.7–3.1)
LYMPHS: 30 %
MCH: 31.7 pg (ref 26.6–33.0)
MCHC: 34.7 g/dL (ref 31.5–35.7)
MCV: 92 fL (ref 79–97)
Monocytes Absolute: 0.6 10*3/uL (ref 0.1–0.9)
Monocytes: 9 %
Neutrophils Absolute: 3.8 10*3/uL (ref 1.4–7.0)
Neutrophils: 56 %
PLATELETS: 213 10*3/uL (ref 150–379)
RBC: 5.39 x10E6/uL (ref 4.14–5.80)
RDW: 13.6 % (ref 12.3–15.4)
WBC: 6.8 10*3/uL (ref 3.4–10.8)

## 2017-09-09 LAB — HEPATIC FUNCTION PANEL
ALT: 15 IU/L (ref 0–44)
AST: 22 IU/L (ref 0–40)
Albumin: 4.3 g/dL (ref 3.5–4.8)
Alkaline Phosphatase: 78 IU/L (ref 39–117)
BILIRUBIN TOTAL: 1.5 mg/dL — AB (ref 0.0–1.2)
Bilirubin, Direct: 0.32 mg/dL (ref 0.00–0.40)
Total Protein: 7.2 g/dL (ref 6.0–8.5)

## 2017-09-09 LAB — LIPID PANEL
CHOLESTEROL TOTAL: 204 mg/dL — AB (ref 100–199)
Chol/HDL Ratio: 4.3 ratio (ref 0.0–5.0)
HDL: 48 mg/dL (ref >39)
LDL CALC: 128 mg/dL — AB (ref 0–99)
TRIGLYCERIDES: 140 mg/dL (ref 0–149)
VLDL Cholesterol Cal: 28 mg/dL (ref 5–40)

## 2017-09-09 LAB — BASIC METABOLIC PANEL
BUN/Creatinine Ratio: 9 — ABNORMAL LOW (ref 10–24)
BUN: 8 mg/dL (ref 8–27)
CALCIUM: 9.5 mg/dL (ref 8.6–10.2)
CO2: 25 mmol/L (ref 20–29)
Chloride: 93 mmol/L — ABNORMAL LOW (ref 96–106)
Creatinine, Ser: 0.89 mg/dL (ref 0.76–1.27)
GFR calc Af Amer: 97 mL/min/{1.73_m2} (ref >59)
GFR, EST NON AFRICAN AMERICAN: 84 mL/min/{1.73_m2} (ref >59)
GLUCOSE: 93 mg/dL (ref 65–99)
Potassium: 5.6 mmol/L — ABNORMAL HIGH (ref 3.5–5.2)
SODIUM: 131 mmol/L — AB (ref 134–144)

## 2017-09-09 LAB — TSH: TSH: 1.56 u[IU]/mL (ref 0.450–4.500)

## 2017-09-13 ENCOUNTER — Encounter: Payer: Self-pay | Admitting: Family Medicine

## 2017-09-13 ENCOUNTER — Ambulatory Visit (INDEPENDENT_AMBULATORY_CARE_PROVIDER_SITE_OTHER): Payer: Medicare Other | Admitting: Family Medicine

## 2017-09-13 VITALS — BP 148/82 | Ht 67.5 in | Wt 143.2 lb

## 2017-09-13 DIAGNOSIS — Z0000 Encounter for general adult medical examination without abnormal findings: Secondary | ICD-10-CM

## 2017-09-13 DIAGNOSIS — E785 Hyperlipidemia, unspecified: Secondary | ICD-10-CM

## 2017-09-13 DIAGNOSIS — R21 Rash and other nonspecific skin eruption: Secondary | ICD-10-CM

## 2017-09-13 DIAGNOSIS — D692 Other nonthrombocytopenic purpura: Secondary | ICD-10-CM | POA: Diagnosis not present

## 2017-09-13 DIAGNOSIS — E871 Hypo-osmolality and hyponatremia: Secondary | ICD-10-CM

## 2017-09-13 DIAGNOSIS — R350 Frequency of micturition: Secondary | ICD-10-CM

## 2017-09-13 DIAGNOSIS — Z0001 Encounter for general adult medical examination with abnormal findings: Secondary | ICD-10-CM | POA: Diagnosis not present

## 2017-09-13 LAB — POCT URINALYSIS DIPSTICK
PH UA: 6 (ref 5.0–8.0)
SPEC GRAV UA: 1.02 (ref 1.010–1.025)

## 2017-09-13 MED ORDER — EZETIMIBE 10 MG PO TABS: 10.0000 mg | ORAL_TABLET | Freq: Every day | ORAL | 5 refills | Status: DC

## 2017-09-13 MED ORDER — ZOSTER VAC RECOMB ADJUVANTED 50 MCG/0.5ML IM SUSR: 0.5000 mL | Freq: Once | INTRAMUSCULAR | 1 refills | Status: AC

## 2017-09-13 MED ORDER — DIAZEPAM 5 MG PO TABS: ORAL_TABLET | ORAL | 1 refills | Status: DC

## 2017-09-13 NOTE — Progress Notes (Signed)
Subjective:    Patient ID: [name], male    DOB: March 13, 1941, 77 y.o.   MRN: [id number]  HPI  AWV- Annual Wellness Visit  The patient was seen for their annual wellness visit. The patient's past medical history, surgical history, and family history were reviewed. Pertinent vaccines were reviewed ( tetanus, pneumonia, shingles, flu) The patient's medication list was reviewed and updated.  The height and weight were entered.  BMI recorded in electronic record elsewhere  Cognitive screening was completed. Outcome of Mini - Cog: pass   Falls /depression screening electronically recorded within record elsewhere  Current tobacco usage:none (All patients who use tobacco were given written and verbal information on quitting)  Recent listing of emergency department/hospitalizations over the past year were reviewed.  current specialist the patient sees on a regular basis: Dr Bubba Camp and Loletha Grayer-  Endocrinologist and urologist   Medicare annual wellness visit patient questionnaire was reviewed.  A written screening schedule for the patient for the next 5-10 years was given. Appropriate discussion of followup regarding next visit was discussed.  Back pain intermittent back pain over the past for 5 days since doing some physical activity he wondered if it could be his kidneys but denies diarrhea sweats chills fevers  encoporesis-does have some encopresis not severe is seeing Dr. Tomi Likens they are planning on doing a MRI on his lumbar spine  Cataracts seeing is Dr. Gershon Crane he will be doing surgery in the future  Senile purpura intermittent bruising on the arms but recent CBC looks good      Review of Systems  Constitutional: Negative for activity change, appetite change and fatigue.  HENT: Negative for congestion and rhinorrhea.   Respiratory: Negative for cough, chest tightness and shortness of breath.   Cardiovascular: Negative for chest pain and leg swelling.  Gastrointestinal:  Negative for abdominal pain, diarrhea and nausea.  Endocrine: Negative for polydipsia and polyphagia.  Genitourinary: Negative for dysuria and hematuria.  Neurological: Negative for weakness and headaches.  Psychiatric/Behavioral: Negative for confusion and dysphoric mood.       Objective:   Physical Exam  Constitutional: He appears well-nourished. No distress.  Cardiovascular: Normal rate, regular rhythm and normal heart sounds.  No murmur heard. Pulmonary/Chest: Effort normal and breath sounds normal. No respiratory distress.  Musculoskeletal: He exhibits no edema.  Lymphadenopathy:    He has no cervical adenopathy.  Neurological: He is alert.  Psychiatric: His behavior is normal.  Vitals reviewed.         Assessment & Plan:  Adult wellness-complete.wellness physical was conducted today. Importance of diet and exercise were discussed in detail.  In addition to this a discussion regarding safety was also covered. We also reviewed over immunizations and gave recommendations regarding current immunization needed for age.  In addition to this additional areas were also touched on including: Preventative health exams needed: Colonoscopy up-to-date on colonoscopy  Patient was advised yearly wellness exam  Shin Grix vaccine recommended  Has history of hyponatremia is on DDAVP followed by endocrinology  History of hypothyroidism TSH looks good continue current medication followed by endocrinology we will send a copy of the lab work to endocrinology  Unusual rash-has seen dermatology in the past he will be seeing a specialist coming up he is requesting a consultation with [facility] dermatology we will help set this up.  Senile purpura recent CBC looked good I do not recommend further testing at this time  Hyperlipidemia did not tolerate statins we will try zetia 1 daily  we will check lab work again in another 6 months  Low back pain urine looks fine I believe the patient is  having more so dysfunctional stretches recommended to use Advil or Aleve for up to 5 to 7 days if ongoing trouble physical therapy

## 2017-09-19 ENCOUNTER — Telehealth: Payer: Self-pay | Admitting: Family Medicine

## 2017-09-19 NOTE — Telephone Encounter (Signed)
Patient seen Dr. Nicki Reaper on 09/13/17 and it was brought up that he may have pulled something in his back that day.  He wants to know if a muscle relaxer can be called in?   [facility] [place]

## 2017-09-20 NOTE — Telephone Encounter (Signed)
Because of his age I do not recommend muscle relaxers they are contraindicated   For his age group and insurance companies do not even cover these.  Therefore I recommend he do stretching exercises or physical therapy.  If he would desire to do physical therapy we can make the referral.  Certainly if patient is not doing better with his back over the next 2 to 3 weeks I recommend follow-up office visit.

## 2017-09-21 NOTE — Telephone Encounter (Signed)
Patient stated over the last 2 days since he sent the message he feels like he is getting better and would like to hold on Physical Therapy at this time. Patient will follow up if ongoing problems.

## 2017-09-28 ENCOUNTER — Encounter: Payer: Self-pay | Admitting: Family Medicine

## 2017-10-04 ENCOUNTER — Encounter (HOSPITAL_COMMUNITY): Payer: Self-pay | Admitting: *Deleted

## 2017-10-04 ENCOUNTER — Emergency Department (HOSPITAL_COMMUNITY)
Admission: EM | Admit: 2017-10-04 | Discharge: 2017-10-04 | Disposition: A | Discharge status: Home / Self Care | Payer: Medicare Other | Attending: Emergency Medicine | Admitting: Emergency Medicine

## 2017-10-04 ENCOUNTER — Emergency Department (HOSPITAL_COMMUNITY): Payer: Medicare Other

## 2017-10-04 ENCOUNTER — Telehealth: Payer: Self-pay | Admitting: *Deleted

## 2017-10-04 DIAGNOSIS — K5790 Diverticulosis of intestine, part unspecified, without perforation or abscess without bleeding: Secondary | ICD-10-CM | POA: Diagnosis not present

## 2017-10-04 DIAGNOSIS — Z79899 Other long term (current) drug therapy: Secondary | ICD-10-CM | POA: Insufficient documentation

## 2017-10-04 DIAGNOSIS — R1032 Left lower quadrant pain: Secondary | ICD-10-CM | POA: Insufficient documentation

## 2017-10-04 DIAGNOSIS — E7800 Pure hypercholesterolemia, unspecified: Secondary | ICD-10-CM | POA: Diagnosis not present

## 2017-10-04 DIAGNOSIS — K625 Hemorrhage of anus and rectum: Secondary | ICD-10-CM | POA: Insufficient documentation

## 2017-10-04 DIAGNOSIS — I10 Essential (primary) hypertension: Secondary | ICD-10-CM | POA: Insufficient documentation

## 2017-10-04 DIAGNOSIS — Z8546 Personal history of malignant neoplasm of prostate: Secondary | ICD-10-CM | POA: Insufficient documentation

## 2017-10-04 DIAGNOSIS — Z87891 Personal history of nicotine dependence: Secondary | ICD-10-CM | POA: Diagnosis not present

## 2017-10-04 LAB — CBC WITH DIFFERENTIAL/PLATELET
Basophils Absolute: 0 10*3/uL (ref 0.0–0.1)
Basophils Relative: 1 %
EOS ABS: 0.2 10*3/uL (ref 0.0–0.7)
Eosinophils Relative: 2 %
HCT: 44 % (ref 39.0–52.0)
HEMOGLOBIN: 15.1 g/dL (ref 13.0–17.0)
LYMPHS ABS: 1.8 10*3/uL (ref 0.7–4.0)
Lymphocytes Relative: 25 %
MCH: 31 pg (ref 26.0–34.0)
MCHC: 34.3 g/dL (ref 30.0–36.0)
MCV: 90.3 fL (ref 78.0–100.0)
MONOS PCT: 10 %
Monocytes Absolute: 0.7 10*3/uL (ref 0.1–1.0)
NEUTROS PCT: 62 %
Neutro Abs: 4.6 10*3/uL (ref 1.7–7.7)
PLATELETS: 165 10*3/uL (ref 150–400)
RBC: 4.87 MIL/uL (ref 4.22–5.81)
RDW: 12.2 % (ref 11.5–15.5)
WBC: 7.3 10*3/uL (ref 4.0–10.5)

## 2017-10-04 LAB — COMPREHENSIVE METABOLIC PANEL
ALBUMIN: 4.1 g/dL (ref 3.5–5.0)
ALT: 16 U/L — AB (ref 17–63)
AST: 22 U/L (ref 15–41)
Alkaline Phosphatase: 56 U/L (ref 38–126)
Anion gap: 5 (ref 5–15)
BUN: 11 mg/dL (ref 6–20)
CHLORIDE: 92 mmol/L — AB (ref 101–111)
CO2: 30 mmol/L (ref 22–32)
CREATININE: 0.79 mg/dL (ref 0.61–1.24)
Calcium: 9 mg/dL (ref 8.9–10.3)
GFR calc Af Amer: >60 mL/min (ref >60)
GFR calc non Af Amer: >60 mL/min (ref >60)
GLUCOSE: 106 mg/dL — AB (ref 65–99)
POTASSIUM: 5.1 mmol/L (ref 3.5–5.1)
Sodium: 127 mmol/L — ABNORMAL LOW (ref 135–145)
Total Bilirubin: 1.6 mg/dL — ABNORMAL HIGH (ref 0.3–1.2)
Total Protein: 7.4 g/dL (ref 6.5–8.1)

## 2017-10-04 LAB — I-STAT CG4 LACTIC ACID, ED: Lactic Acid, Venous: 0.93 mmol/L (ref 0.5–1.9)

## 2017-10-04 LAB — PROTIME-INR
INR: 0.98
Prothrombin Time: 12.9 seconds (ref 11.4–15.2)

## 2017-10-04 LAB — TYPE AND SCREEN
ABO/RH(D): A POS
ANTIBODY SCREEN: NEGATIVE

## 2017-10-04 LAB — LIPASE, BLOOD: Lipase: 25 U/L (ref 11–51)

## 2017-10-04 MED ORDER — SODIUM CHLORIDE 0.9 % IV SOLN
INTRAVENOUS | Status: DC
  Administered 2017-10-04: 13:00:00 via INTRAVENOUS

## 2017-10-04 MED ORDER — IOPAMIDOL (ISOVUE-300) INJECTION 61%
100.0000 mL | Freq: Once | INTRAVENOUS | Status: AC | PRN
  Administered 2017-10-04: 100 mL via INTRAVENOUS

## 2017-10-04 NOTE — Discharge Instructions (Addendum)
As discussed, your evaluation today has been largely reassuring.  But, it is important that you monitor your condition carefully, and do not hesitate to return to the ED if you develop new, or concerning changes in your condition. ? ?Otherwise, please follow-up with your physician for appropriate ongoing care. ? ?

## 2017-10-04 NOTE — ED Triage Notes (Signed)
Pt noted dark black stools for past 3-4 days, denies iron supplements, denies blood thinners as well.

## 2017-10-04 NOTE — Telephone Encounter (Signed)
Pt is at ED now.  

## 2017-10-04 NOTE — ED Triage Notes (Signed)
Pt with left groin pain for several months per pt, pt with hx of prostate cancer.  Pt admits to taking aleve at home.

## 2017-10-04 NOTE — ED Provider Notes (Signed)
[facility] EMERGENCY DEPARTMENT Provider Note   CSN: [id number] Arrival date & time: 10/04/17  1104     History   Chief Complaint Chief Complaint  Patient presents with  . GI Bleeding    HPI [name] is a 77 y.o. male.  HPI Patient presents with concern of rectal bleeding. Illness began about 6 days ago, and only notable change in his situation prior to that was initiation of a new anti-cholesterol medication. Patient initially noticed red blood with defecation. Over the interval days he has had persistent discoloration of his stool, though the coloration has changed from red to black. No new lightheadedness, though there is some left lower quadrant abdominal pain, right mid lateral back pain. Neither of these is appreciably different with defecation. Patient has had a colonoscopy about 5 years ago Patient also has a notable history of prostate cancer, now status post surgery.  Past Medical History:  Diagnosis Date  . Arthritis   . Colon polyps    Adenomatous  . Diverticulosis   . Essential hypertension   . GERD (gastroesophageal reflux disease)   . Hypercholesterolemia   . Prostate cancer [facility])     Patient Active Problem List   Diagnosis Date Noted  . Hyponatremia 09/06/2016  . History of prostate cancer 12/17/2013  . Leg weakness, bilateral 09/17/2013  . Stiffness of joints, not elsewhere classified, multiple sites 09/17/2013  . Radicular leg pain 09/17/2013  . Loss of weight 11/27/2012  . Hypothyroidism 11/27/2012  . CONSTIPATION 07/01/2008  . RECTAL BLEEDING 07/01/2008  . PERSONAL HX COLONIC POLYPS 07/01/2008  . PITUITARY MACROADENOMA 06/27/2008  . HYPERCHOLESTEROLEMIA 06/27/2008  . Essential hypertension 06/27/2008  . GERD 06/27/2008  . COLONIC POLYPS 07/03/2003  . DIVERTICULOSIS, COLON 07/03/2003    Past Surgical History:  Procedure Laterality Date  . BACK SURGERY  1991  . CHOLECYSTECTOMY  08/2007  . PROSTATE SURGERY    . Removal of  pituitary tumor  9/09        Home Medications    Prior to Admission medications   Medication Sig Start Date End Date Taking? Authorizing Provider  clobetasol cream (TEMOVATE) 6.07 % Apply 1 application topically 2 (two) times daily. 07/12/17  Yes [name], MD  desmopressin (DDAVP) 0.1 MG tablet Take 0.1 mg by mouth 4 (four) times daily as needed.    Yes [provider]  diazepam (VALIUM) 5 MG tablet One half to one tablet every 8 hrs as needed for cramps 09/13/17  Yes Luking, Scott A, MD  levothyroxine (SYNTHROID, LEVOTHROID) 88 MCG tablet Take 88 mcg by mouth every other day.   Yes [provider]  naproxen sodium (ALEVE) 220 MG tablet Take 440 mg by mouth daily as needed.   Yes [provider]  omeprazole (PRILOSEC) 20 MG capsule TAKE 1 BY MOUTH DAILY 02/14/17  Yes Luking, Elayne Snare, MD  Skin Protectants, Misc. (MINERIN) CREA Apply bid prn 07/26/16  Yes Luking, Elayne Snare, MD  ezetimibe (ZETIA) 10 MG tablet Take 1 tablet (10 mg total) by mouth daily. 09/13/17   [name], MD  mometasone (ELOCON) 0.1 % cream Apply 1 application topically 2 (two) times daily. 09/06/16   [name], MD  triamcinolone ointment (KENALOG) 0.5 % Apply 1 application topically 2 (two) times daily. 07/26/16   [name], MD    Family History Family History  Problem Relation Age of Onset  . Clotting disorder Father   . Heart disease Father   .  Heart attack Father   . Colon cancer Neg Hx   . Diabetes Neg Hx     Social History Social History   Tobacco Use  . Smoking status: Former Smoker    Types: Cigarettes  . Smokeless tobacco: Former Systems developer    Quit date: 11/03/1977  Substance Use Topics  . Alcohol use: Yes    Alcohol/week: 0.0 oz    Comment: one drink a day per pt  . Drug use: Not Currently     Allergies   Codeine; Losartan; Morphine; Amlodipine; Statins; and Tramadol   Review of Systems Review of Systems  Constitutional:       Per HPI, otherwise negative   HENT:       Per HPI, otherwise negative  Respiratory:       Per HPI, otherwise negative  Cardiovascular:       Per HPI, otherwise negative  Gastrointestinal: Negative for vomiting.  Endocrine:       Negative aside from HPI  Genitourinary:       Neg aside from HPI   Musculoskeletal:       Per HPI, otherwise negative  Skin: Negative.   Neurological: Negative for syncope.     Physical Exam Updated Vital Signs BP (!) 162/73   Pulse (!) 52   Temp 97.9 F (36.6 C)   Resp 18   Ht 5\' 7"  (1.702 m)   Wt 66.7 kg (147 lb)   SpO2 99%   BMI 23.02 kg/m   Physical Exam  Constitutional: He is oriented to person, place, and time. He appears well-developed. No distress.  HENT:  Head: Normocephalic and atraumatic.  Eyes: Conjunctivae and EOM are normal.  Cardiovascular: Normal rate and regular rhythm.  Pulmonary/Chest: Effort normal. No stridor. No respiratory distress.  Abdominal: He exhibits no distension. There is tenderness in the left lower quadrant.  Musculoskeletal: He exhibits no edema.  Neurological: He is alert and oriented to person, place, and time.  Skin: Skin is warm and dry.  Psychiatric: He has a normal mood and affect.  Nursing note and vitals reviewed.    ED Treatments / Results  Labs (all labs ordered are listed, but only abnormal results are displayed) Labs Reviewed  COMPREHENSIVE METABOLIC PANEL - Abnormal; Notable for the following components:      Result Value   Sodium 127 (*)    Chloride 92 (*)    Glucose, Bld 106 (*)    ALT 16 (*)    Total Bilirubin 1.6 (*)    All other components within normal limits  CBC WITH DIFFERENTIAL/PLATELET  LIPASE, BLOOD  PROTIME-INR  OCCULT BLOOD X 1 CARD TO LAB, STOOL  I-STAT CG4 LACTIC ACID, ED  TYPE AND SCREEN    EKG None  Radiology Ct Abdomen Pelvis W Contrast  Result Date: 10/04/2017 CLINICAL DATA:  Left groin pain for several months EXAM: CT ABDOMEN AND PELVIS WITH CONTRAST TECHNIQUE: Multidetector CT  imaging of the abdomen and pelvis was performed using the standard protocol following bolus administration of intravenous contrast. CONTRAST:  160mL ISOVUE-300 IOPAMIDOL (ISOVUE-300) INJECTION 61% COMPARISON:  None. FINDINGS: Lower chest: No acute abnormality. Hepatobiliary: No focal liver abnormality is seen. Status post cholecystectomy. No biliary dilatation. Pancreas: Unremarkable. No pancreatic ductal dilatation or surrounding inflammatory changes. Spleen: Normal in size without focal abnormality. Adrenals/Urinary Tract: Adrenal glands are unremarkable. Kidneys are normal, without renal calculi, focal lesion, or hydronephrosis. Bladder is unremarkable. Stomach/Bowel: Stomach is within normal limits. Appendix appears normal. No evidence of bowel wall  thickening, distention, or inflammatory changes. Diverticulosis without evidence of diverticulitis. Vascular/Lymphatic: Aortic atherosclerosis. No enlarged abdominal or pelvic lymph nodes. Reproductive: Prostate is unremarkable. Other: No abdominal wall hernia or abnormality. No abdominopelvic ascites. Musculoskeletal: No acute or significant osseous findings. Degenerative disc disease with disc height loss at L2-3, L3-4 L4-5 and S1 with bilateral facet arthropathy. Subchondral sclerosis in the left femoral head consistent with avascular necrosis without articular surface collapse. IMPRESSION: 1. No acute abdominal or pelvic pathology. 2. Diverticulosis without evidence of diverticulitis. 3. Avascular necrosis of the left femoral head without collapse. Electronically Signed   By: Kathreen Devoid   On: 10/04/2017 14:58    Procedures Procedures (including critical care time)  Medications Ordered in ED Medications  0.9 %  sodium chloride infusion ( Intravenous New Bag/Given 10/04/17 1256)  iopamidol (ISOVUE-300) 61 % injection 100 mL (100 mLs Intravenous Contrast Given 10/04/17 1438)     Initial Impression / Assessment and Plan / ED Course  I have reviewed the  triage vital signs and the nursing notes.  Pertinent labs & imaging results that were available during my care of the patient were reviewed by me and considered in my medical decision making (see chart for details).     3:29 PM On repeat exam the patient is awake alert, no distress he remains hemodynamically unremarkable. We discussed all findings, patient will follow up with his gastroenterologist.  This elderly male presents with rectal bleeding, which is progressed from red to black stool. Here the patient is awake, alert, hemodynamically unremarkable, no lactic acidosis, no leukocytosis. He does have some lower abdominal pain, and given concern for infection, as well as with consideration of his history of malignancy, possible mass, the patient had CT scan performed. This was reassuring, consistent with diverticulosis, no obvious mass. Patient remained hemodynamically unremarkable for hours of monitoring here, and was suspicion for diverticular bleed, patient will follow up with his gastroenterologist.   Final Clinical Impressions(s) / ED Diagnoses  Rectal bleeding   Carmin Muskrat, MD 10/04/17 1530

## 2017-10-04 NOTE — Telephone Encounter (Signed)
Pt called and states he has had blood in stool for the past several days. toliet has been full of bright red blood up unitl yesterday. He stool looked dark yesterday. He states not really having pain. He thought it was his cholesterol med so he stopped it and thinks the bleeding has cleared up today due to stopping med. Pt advised cholesterol med not the cause of the bleeding and he needs to go to ED for evaluation.

## 2017-10-04 NOTE — Telephone Encounter (Signed)
If the patient is no longer having any bleeding and he overall feels okay we could see him tomorrow morning.  If he has recurrence of bleeding to go to the ER.  If the patient states he feels very bad lightheaded and severe pains or ongoing bleeding he should go to the ER

## 2017-10-13 DIAGNOSIS — L565 Disseminated superficial actinic porokeratosis (DSAP): Secondary | ICD-10-CM | POA: Diagnosis not present

## 2017-10-13 DIAGNOSIS — D1801 Hemangioma of skin and subcutaneous tissue: Secondary | ICD-10-CM | POA: Diagnosis not present

## 2017-10-13 DIAGNOSIS — L821 Other seborrheic keratosis: Secondary | ICD-10-CM | POA: Diagnosis not present

## 2017-10-13 DIAGNOSIS — L111 Transient acantholytic dermatosis [Grover]: Secondary | ICD-10-CM | POA: Diagnosis not present

## 2017-10-28 ENCOUNTER — Other Ambulatory Visit: Payer: Self-pay | Admitting: Family Medicine

## 2017-11-17 ENCOUNTER — Other Ambulatory Visit: Payer: Self-pay | Admitting: *Deleted

## 2017-11-17 ENCOUNTER — Telehealth: Payer: Self-pay | Admitting: Family Medicine

## 2017-11-17 MED ORDER — HYDROXYZINE HCL 25 MG PO TABS: 25.0000 mg | ORAL_TABLET | Freq: Three times a day (TID) | ORAL | 0 refills | Status: DC | PRN

## 2017-11-17 NOTE — Telephone Encounter (Signed)
Med sent to pharm. Pt notified.  

## 2017-11-17 NOTE — Telephone Encounter (Signed)
Patient said he has been seen by Dr. Nicki Reaper this year for issues with itching.  He said someone told him about Hydroxyzine.  He wants to know if this could be called in for him?  [facility] [place]

## 2017-11-17 NOTE — Telephone Encounter (Signed)
Hydroxyzine 25 mg numb 30 one q 8 hrs prn drowsy prec , two ref

## 2017-11-22 DIAGNOSIS — L565 Disseminated superficial actinic porokeratosis (DSAP): Secondary | ICD-10-CM | POA: Diagnosis not present

## 2017-11-22 DIAGNOSIS — L820 Inflamed seborrheic keratosis: Secondary | ICD-10-CM | POA: Diagnosis not present

## 2017-11-22 DIAGNOSIS — L821 Other seborrheic keratosis: Secondary | ICD-10-CM | POA: Diagnosis not present

## 2017-11-22 DIAGNOSIS — L111 Transient acantholytic dermatosis [Grover]: Secondary | ICD-10-CM | POA: Diagnosis not present

## 2017-12-15 DIAGNOSIS — E232 Diabetes insipidus: Secondary | ICD-10-CM | POA: Diagnosis not present

## 2017-12-15 DIAGNOSIS — D443 Neoplasm of uncertain behavior of pituitary gland: Secondary | ICD-10-CM | POA: Diagnosis not present

## 2017-12-15 DIAGNOSIS — E032 Hypothyroidism due to medicaments and other exogenous substances: Secondary | ICD-10-CM | POA: Diagnosis not present

## 2017-12-21 ENCOUNTER — Other Ambulatory Visit (HOSPITAL_COMMUNITY): Payer: Self-pay | Admitting: Endocrinology

## 2017-12-21 DIAGNOSIS — D443 Neoplasm of uncertain behavior of pituitary gland: Secondary | ICD-10-CM | POA: Diagnosis not present

## 2017-12-21 DIAGNOSIS — D444 Neoplasm of uncertain behavior of craniopharyngeal duct: Principal | ICD-10-CM

## 2017-12-21 DIAGNOSIS — E032 Hypothyroidism due to medicaments and other exogenous substances: Secondary | ICD-10-CM | POA: Diagnosis not present

## 2017-12-21 DIAGNOSIS — E232 Diabetes insipidus: Secondary | ICD-10-CM | POA: Diagnosis not present

## 2017-12-28 ENCOUNTER — Ambulatory Visit (HOSPITAL_COMMUNITY)
Admission: RE | Admit: 2017-12-28 | Discharge: 2017-12-28 | Disposition: A | Discharge status: Home / Self Care | Payer: Medicare Other | Source: Ambulatory Visit | Attending: Endocrinology | Admitting: Endocrinology

## 2017-12-28 DIAGNOSIS — R51 Headache: Secondary | ICD-10-CM | POA: Insufficient documentation

## 2017-12-28 DIAGNOSIS — D443 Neoplasm of uncertain behavior of pituitary gland: Secondary | ICD-10-CM | POA: Insufficient documentation

## 2017-12-28 DIAGNOSIS — I6782 Cerebral ischemia: Secondary | ICD-10-CM | POA: Insufficient documentation

## 2017-12-28 DIAGNOSIS — D444 Neoplasm of uncertain behavior of craniopharyngeal duct: Secondary | ICD-10-CM | POA: Insufficient documentation

## 2017-12-28 LAB — POCT I-STAT CREATININE: CREATININE: 0.8 mg/dL (ref 0.61–1.24)

## 2017-12-28 MED ORDER — GADOBENATE DIMEGLUMINE 529 MG/ML IV SOLN
7.0000 mL | Freq: Once | INTRAVENOUS | Status: AC | PRN
  Administered 2017-12-28: 7 mL via INTRAVENOUS

## 2018-01-30 DIAGNOSIS — H25011 Cortical age-related cataract, right eye: Secondary | ICD-10-CM | POA: Diagnosis not present

## 2018-01-30 DIAGNOSIS — H25013 Cortical age-related cataract, bilateral: Secondary | ICD-10-CM | POA: Diagnosis not present

## 2018-01-30 DIAGNOSIS — H2513 Age-related nuclear cataract, bilateral: Secondary | ICD-10-CM | POA: Diagnosis not present

## 2018-01-30 DIAGNOSIS — H2511 Age-related nuclear cataract, right eye: Secondary | ICD-10-CM | POA: Diagnosis not present

## 2018-01-30 DIAGNOSIS — H04123 Dry eye syndrome of bilateral lacrimal glands: Secondary | ICD-10-CM | POA: Diagnosis not present

## 2018-02-22 DIAGNOSIS — H2512 Age-related nuclear cataract, left eye: Secondary | ICD-10-CM | POA: Diagnosis not present

## 2018-02-22 DIAGNOSIS — H2511 Age-related nuclear cataract, right eye: Secondary | ICD-10-CM | POA: Diagnosis not present

## 2018-02-22 DIAGNOSIS — H25011 Cortical age-related cataract, right eye: Secondary | ICD-10-CM | POA: Diagnosis not present

## 2018-02-22 DIAGNOSIS — H25012 Cortical age-related cataract, left eye: Secondary | ICD-10-CM | POA: Diagnosis not present

## 2018-03-06 DIAGNOSIS — L565 Disseminated superficial actinic porokeratosis (DSAP): Secondary | ICD-10-CM | POA: Diagnosis not present

## 2018-03-06 DIAGNOSIS — L821 Other seborrheic keratosis: Secondary | ICD-10-CM | POA: Diagnosis not present

## 2018-03-06 DIAGNOSIS — L720 Epidermal cyst: Secondary | ICD-10-CM | POA: Diagnosis not present

## 2018-03-06 DIAGNOSIS — L570 Actinic keratosis: Secondary | ICD-10-CM | POA: Diagnosis not present

## 2018-03-06 DIAGNOSIS — L111 Transient acantholytic dermatosis [Grover]: Secondary | ICD-10-CM | POA: Diagnosis not present

## 2018-03-08 DIAGNOSIS — H2511 Age-related nuclear cataract, right eye: Secondary | ICD-10-CM | POA: Diagnosis not present

## 2018-03-08 DIAGNOSIS — H25011 Cortical age-related cataract, right eye: Secondary | ICD-10-CM | POA: Diagnosis not present

## 2018-03-10 DIAGNOSIS — Z23 Encounter for immunization: Secondary | ICD-10-CM | POA: Diagnosis not present

## 2018-03-16 ENCOUNTER — Encounter: Payer: Self-pay | Admitting: Family Medicine

## 2018-03-16 ENCOUNTER — Ambulatory Visit (INDEPENDENT_AMBULATORY_CARE_PROVIDER_SITE_OTHER): Payer: Medicare Other | Admitting: Family Medicine

## 2018-03-16 VITALS — BP 168/72 | Ht 67.5 in | Wt 147.0 lb

## 2018-03-16 DIAGNOSIS — K921 Melena: Secondary | ICD-10-CM | POA: Diagnosis not present

## 2018-03-16 DIAGNOSIS — E785 Hyperlipidemia, unspecified: Secondary | ICD-10-CM

## 2018-03-16 DIAGNOSIS — I10 Essential (primary) hypertension: Secondary | ICD-10-CM | POA: Diagnosis not present

## 2018-03-16 NOTE — Progress Notes (Signed)
Subjective:    Patient ID: [name], male    DOB: 1940-07-24, 77 y.o.   MRN: [id number]  HPI  Patient is here today to follow up on chronic health issues. He has hypothyroidism and takes levothyroxine 88 mcg QOD. The patient is followed by specialist for his thyroid takes his medicine on a regular basis  He is not eating right as of late.He does not get any exercise. He states he does try to eat somewhat healthy but often he ends up needing out  He sees onc and endocrinology for tumor removal. He did have a eye surgery recently for cataract Patient has not had any low sodium slightly He does worry about diabetes because sometimes he is increased thirst increased urination He does follow with [facility] intermittently Review of Systems  Constitutional: Negative for diaphoresis and fatigue.  HENT: Negative for congestion and rhinorrhea.   Respiratory: Negative for cough and shortness of breath.   Cardiovascular: Negative for chest pain and leg swelling.  Gastrointestinal: Negative for abdominal pain and diarrhea.  Endocrine: Positive for polydipsia. Negative for polyphagia and polyuria.  Skin: Negative for color change and rash.  Neurological: Negative for dizziness and headaches.  Psychiatric/Behavioral: Negative for behavioral problems and confusion.       Objective:   Physical Exam  Constitutional: He appears well-nourished. No distress.  HENT:  Head: Normocephalic and atraumatic.  Eyes: Right eye exhibits no discharge. Left eye exhibits no discharge.  Neck: No tracheal deviation present.  Cardiovascular: Normal rate, regular rhythm and normal heart sounds.  No murmur heard. Pulmonary/Chest: Effort normal and breath sounds normal. No respiratory distress.  Musculoskeletal: He exhibits no edema.  Lymphadenopathy:    He has no cervical adenopathy.  Neurological: He is alert. Coordination normal.  Skin: Skin is warm and dry.  Psychiatric: He has a normal mood and  affect. His behavior is normal.  Vitals reviewed. The patient states he saw some blood in his stool several months ago but it went away so he never really thought any more of an he is up-to-date on colonoscopy he does not know if he was constipated at that time denies abdominal pain sweats weight loss has not had this problem before  Patient states that he did take hydroxyzine for itching but the 25 mg made him very drowsy made him feel somewhat off balance he states he is taking a much lower dose now and does better I advised him no more than 1/4 tablet  He does take his omeprazole on a regular basis for reflux and states it does a good job keeping things under control   He does have a history of diabetes insipidus and does take his medicine and tries to avoid excessive water intake has not had his sodium tested recently.  Denies any numbness weakness.  25 minutes was spent with the patient.  This statement verifies that 25 minutes was indeed spent with the patient.  More than 50% of this visit-total duration of the visit-was spent in counseling and coordination of care. The issues that the patient came in for today as reflected in the diagnosis (s) please refer to documentation for further details.      Assessment & Plan:  Patient states on Monday he did notice blood in the stool this happened several months ago has not noticed this since  We will go ahead and do stool test for blood along with CBC  Hyperlipidemia watch diet check lipid profile  Hypertension good  control check kidney kidney function follow-up if ongoing troubles  I doubt patient has diabetes he is concerned about this we will watch to see how his sugar is  25 minutes was spent with the patient.  This statement verifies that 25 minutes was indeed spent with the patient.  More than 50% of this visit-total duration of the visit-was spent in counseling and coordination of care. The issues that the patient came in for today as  reflected in the diagnosis (s) please refer to documentation for further details.

## 2018-03-17 DIAGNOSIS — E785 Hyperlipidemia, unspecified: Secondary | ICD-10-CM | POA: Diagnosis not present

## 2018-03-17 DIAGNOSIS — I10 Essential (primary) hypertension: Secondary | ICD-10-CM | POA: Diagnosis not present

## 2018-03-18 LAB — LIPID PANEL
Chol/HDL Ratio: 4.3 ratio (ref 0.0–5.0)
Cholesterol, Total: 207 mg/dL — ABNORMAL HIGH (ref 100–199)
HDL: 48 mg/dL (ref >39)
LDL Calculated: 136 mg/dL — ABNORMAL HIGH (ref 0–99)
Triglycerides: 117 mg/dL (ref 0–149)
VLDL Cholesterol Cal: 23 mg/dL (ref 5–40)

## 2018-03-18 LAB — CBC WITH DIFFERENTIAL/PLATELET
BASOS: 1 %
Basophils Absolute: 0 10*3/uL (ref 0.0–0.2)
EOS (ABSOLUTE): 0.2 10*3/uL (ref 0.0–0.4)
Eos: 3 %
Hematocrit: 48.6 % (ref 37.5–51.0)
Hemoglobin: 16.5 g/dL (ref 13.0–17.7)
IMMATURE GRANULOCYTES: 1 %
Immature Grans (Abs): 0 10*3/uL (ref 0.0–0.1)
Lymphocytes Absolute: 1.7 10*3/uL (ref 0.7–3.1)
Lymphs: 30 %
MCH: 29.7 pg (ref 26.6–33.0)
MCHC: 34 g/dL (ref 31.5–35.7)
MCV: 87 fL (ref 79–97)
MONOS ABS: 0.6 10*3/uL (ref 0.1–0.9)
Monocytes: 10 %
NEUTROS PCT: 55 %
Neutrophils Absolute: 3.2 10*3/uL (ref 1.4–7.0)
PLATELETS: 250 10*3/uL (ref 150–450)
RBC: 5.56 x10E6/uL (ref 4.14–5.80)
RDW: 12.4 % (ref 12.3–15.4)
WBC: 5.7 10*3/uL (ref 3.4–10.8)

## 2018-03-18 LAB — BASIC METABOLIC PANEL
BUN / CREAT RATIO: 10 (ref 10–24)
BUN: 10 mg/dL (ref 8–27)
CO2: 26 mmol/L (ref 20–29)
CREATININE: 0.96 mg/dL (ref 0.76–1.27)
Calcium: 9.9 mg/dL (ref 8.6–10.2)
Chloride: 96 mmol/L (ref 96–106)
GFR calc Af Amer: 89 mL/min/{1.73_m2} (ref >59)
GFR calc non Af Amer: 77 mL/min/{1.73_m2} (ref >59)
GLUCOSE: 99 mg/dL (ref 65–99)
Potassium: 5.5 mmol/L — ABNORMAL HIGH (ref 3.5–5.2)
Sodium: 133 mmol/L — ABNORMAL LOW (ref 134–144)

## 2018-03-22 ENCOUNTER — Ambulatory Visit (INDEPENDENT_AMBULATORY_CARE_PROVIDER_SITE_OTHER): Payer: Medicare Other | Admitting: Family Medicine

## 2018-03-22 ENCOUNTER — Encounter: Payer: Self-pay | Admitting: Family Medicine

## 2018-03-22 VITALS — BP 158/72 | Ht 67.5 in | Wt 148.0 lb

## 2018-03-22 DIAGNOSIS — R030 Elevated blood-pressure reading, without diagnosis of hypertension: Secondary | ICD-10-CM | POA: Diagnosis not present

## 2018-03-22 NOTE — Progress Notes (Signed)
   Subjective:    Patient ID: [name], male    DOB: 1940-08-17, 77 y.o.   MRN: [id number]  HPI  Patient is here today to follow up on his bp he says it has been going well except this am as he says it was 161/73. He states this am reading could be high as he went to have a drink with a friend. He has brought his readings in for reviewed from the las few days. He says he is not on a bp lowering agent. I checked his bp manually this am at the office visit and bp was 158/72 left arm and his machine 172/79 left arm.  Review of Systems  Constitutional: Negative for activity change, fatigue and fever.  HENT: Negative for congestion and rhinorrhea.   Respiratory: Negative for cough and shortness of breath.   Cardiovascular: Negative for chest pain and leg swelling.  Gastrointestinal: Negative for abdominal pain, diarrhea and nausea.  Genitourinary: Negative for dysuria and hematuria.  Neurological: Negative for weakness and headaches.  Psychiatric/Behavioral: Negative for agitation and behavioral problems.       Objective:   Physical Exam  Constitutional: He appears well-nourished. No distress.  HENT:  Head: Normocephalic and atraumatic.  Eyes: Right eye exhibits no discharge. Left eye exhibits no discharge.  Neck: No tracheal deviation present.  Cardiovascular: Normal rate, regular rhythm and normal heart sounds.  No murmur heard. Pulmonary/Chest: Effort normal and breath sounds normal. No respiratory distress.  Musculoskeletal: He exhibits no edema.  Lymphadenopathy:    He has no cervical adenopathy.  Neurological: He is alert. Coordination normal.  Skin: Skin is warm and dry.  Psychiatric: He has a normal mood and affect. His behavior is normal.  Vitals reviewed.    15 minutes was spent with patient today discussing healthcare issues which they came.  More than 50% of this visit-total duration of visit-was spent in counseling and coordination of care.  Please see diagnosis  regarding the focus of this coordination and care      Assessment & Plan:  Blood pressure check with our cuff and his cuff multiple different times Minimal difference his reads about 10 points higher Based on our blood pressure readings I do not feel the patient needs to be on any additional medications Follow-up in 3 months Minimize salt in diet

## 2018-03-22 NOTE — Patient Instructions (Signed)
DASH Eating Plan DASH stands for "Dietary Approaches to Stop Hypertension." The DASH eating plan is a healthy eating plan that has been shown to reduce high blood pressure (hypertension). It may also reduce your risk for type 2 diabetes, heart disease, and stroke. The DASH eating plan may also help with weight loss. What are tips for following this plan? General guidelines  Avoid eating more than 2,300 mg (milligrams) of salt (sodium) a day. If you have hypertension, you may need to reduce your sodium intake to 1,500 mg a day.  Limit alcohol intake to no more than 1 drink a day for nonpregnant women and 2 drinks a day for men. One drink equals 12 oz of beer, 5 oz of wine, or 1 oz of hard liquor.  Work with your health care provider to maintain a healthy body weight or to lose weight. Ask what an ideal weight is for you.  Get at least 30 minutes of exercise that causes your heart to beat faster (aerobic exercise) most days of the week. Activities may include walking, swimming, or biking.  Work with your health care provider or diet and nutrition specialist (dietitian) to adjust your eating plan to your individual calorie needs. Reading food labels  Check food labels for the amount of sodium per serving. Choose foods with less than 5 percent of the Daily Value of sodium. Generally, foods with less than 300 mg of sodium per serving fit into this eating plan.  To find whole grains, look for the word "whole" as the first word in the ingredient list. Shopping  Buy products labeled as "low-sodium" or "no salt added."  Buy fresh foods. Avoid canned foods and premade or frozen meals. Cooking  Avoid adding salt when cooking. Use salt-free seasonings or herbs instead of table salt or sea salt. Check with your health care provider or pharmacist before using salt substitutes.  Do not fry foods. Cook foods using healthy methods such as baking, boiling, grilling, and broiling instead.  Cook with  heart-healthy oils, such as olive, canola, soybean, or sunflower oil. Meal planning   Eat a balanced diet that includes: ? 5 or more servings of fruits and vegetables each day. At each meal, try to fill half of your plate with fruits and vegetables. ? Up to 6-8 servings of whole grains each day. ? Less than 6 oz of lean meat, poultry, or fish each day. A 3-oz serving of meat is about the same size as a deck of cards. One egg equals 1 oz. ? 2 servings of low-fat dairy each day. ? A serving of nuts, seeds, or beans 5 times each week. ? Heart-healthy fats. Healthy fats called Omega-3 fatty acids are found in foods such as flaxseeds and coldwater fish, like sardines, salmon, and mackerel.  Limit how much you eat of the following: ? Canned or prepackaged foods. ? Food that is high in trans fat, such as fried foods. ? Food that is high in saturated fat, such as fatty meat. ? Sweets, desserts, sugary drinks, and other foods with added sugar. ? Full-fat dairy products.  Do not salt foods before eating.  Try to eat at least 2 vegetarian meals each week.  Eat more home-cooked food and less restaurant, buffet, and fast food.  When eating at a restaurant, ask that your food be prepared with less salt or no salt, if possible. What foods are recommended? The items listed may not be a complete list. Talk with your dietitian about what   dietary choices are best for you. Grains Whole-grain or whole-wheat bread. Whole-grain or whole-wheat pasta. Brown rice. Oatmeal. Quinoa. Bulgur. Whole-grain and low-sodium cereals. Pita bread. Low-fat, low-sodium crackers. Whole-wheat flour tortillas. Vegetables Fresh or frozen vegetables (raw, steamed, roasted, or grilled). Low-sodium or reduced-sodium tomato and vegetable juice. Low-sodium or reduced-sodium tomato sauce and tomato paste. Low-sodium or reduced-sodium canned vegetables. Fruits All fresh, dried, or frozen fruit. Canned fruit in natural juice (without  added sugar). Meat and other protein foods Skinless chicken or turkey. Ground chicken or turkey. Pork with fat trimmed off. Fish and seafood. Egg whites. Dried beans, peas, or lentils. Unsalted nuts, nut butters, and seeds. Unsalted canned beans. Lean cuts of beef with fat trimmed off. Low-sodium, lean deli meat. Dairy Low-fat (1%) or fat-free (skim) milk. Fat-free, low-fat, or reduced-fat cheeses. Nonfat, low-sodium ricotta or cottage cheese. Low-fat or nonfat yogurt. Low-fat, low-sodium cheese. Fats and oils Soft margarine without trans fats. Vegetable oil. Low-fat, reduced-fat, or light mayonnaise and salad dressings (reduced-sodium). Canola, safflower, olive, soybean, and sunflower oils. Avocado. Seasoning and other foods Herbs. Spices. Seasoning mixes without salt. Unsalted popcorn and pretzels. Fat-free sweets. What foods are not recommended? The items listed may not be a complete list. Talk with your dietitian about what dietary choices are best for you. Grains Baked goods made with fat, such as croissants, muffins, or some breads. Dry pasta or rice meal packs. Vegetables Creamed or fried vegetables. Vegetables in a cheese sauce. Regular canned vegetables (not low-sodium or reduced-sodium). Regular canned tomato sauce and paste (not low-sodium or reduced-sodium). Regular tomato and vegetable juice (not low-sodium or reduced-sodium). Pickles. Olives. Fruits Canned fruit in a light or heavy syrup. Fried fruit. Fruit in cream or butter sauce. Meat and other protein foods Fatty cuts of meat. Ribs. Fried meat. Bacon. Sausage. Bologna and other processed lunch meats. Salami. Fatback. Hotdogs. Bratwurst. Salted nuts and seeds. Canned beans with added salt. Canned or smoked fish. Whole eggs or egg yolks. Chicken or turkey with skin. Dairy Whole or 2% milk, cream, and half-and-half. Whole or full-fat cream cheese. Whole-fat or sweetened yogurt. Full-fat cheese. Nondairy creamers. Whipped toppings.  Processed cheese and cheese spreads. Fats and oils Butter. Stick margarine. Lard. Shortening. Ghee. Bacon fat. Tropical oils, such as coconut, palm kernel, or palm oil. Seasoning and other foods Salted popcorn and pretzels. Onion salt, garlic salt, seasoned salt, table salt, and sea salt. Worcestershire sauce. Tartar sauce. Barbecue sauce. Teriyaki sauce. Soy sauce, including reduced-sodium. Steak sauce. Canned and packaged gravies. Fish sauce. Oyster sauce. Cocktail sauce. Horseradish that you find on the shelf. Ketchup. Mustard. Meat flavorings and tenderizers. Bouillon cubes. Hot sauce and Tabasco sauce. Premade or packaged marinades. Premade or packaged taco seasonings. Relishes. Regular salad dressings. Where to find more information:  National Heart, Lung, and Blood Institute: www.nhlbi.nih.gov  American Heart Association: www.heart.org Summary  The DASH eating plan is a healthy eating plan that has been shown to reduce high blood pressure (hypertension). It may also reduce your risk for type 2 diabetes, heart disease, and stroke.  With the DASH eating plan, you should limit salt (sodium) intake to 2,300 mg a day. If you have hypertension, you may need to reduce your sodium intake to 1,500 mg a day.  When on the DASH eating plan, aim to eat more fresh fruits and vegetables, whole grains, lean proteins, low-fat dairy, and heart-healthy fats.  Work with your health care provider or diet and nutrition specialist (dietitian) to adjust your eating plan to your individual   calorie needs. This information is not intended to replace advice given to you by your health care provider. Make sure you discuss any questions you have with your health care provider. Document Released: 04/15/2011 Document Revised: 04/19/2016 Document Reviewed: 04/19/2016 Elsevier Interactive Patient Education  2018 Elsevier Inc.  

## 2018-03-24 ENCOUNTER — Other Ambulatory Visit: Payer: Self-pay

## 2018-03-24 DIAGNOSIS — K921 Melena: Secondary | ICD-10-CM

## 2018-03-24 LAB — IFOBT (OCCULT BLOOD): IFOBT: NEGATIVE

## 2018-03-27 ENCOUNTER — Telehealth: Payer: Self-pay

## 2018-03-27 NOTE — Telephone Encounter (Signed)
Patient wants to know what his bloodwork showed.

## 2018-03-28 ENCOUNTER — Other Ambulatory Visit: Payer: Self-pay | Admitting: Family Medicine

## 2018-03-28 DIAGNOSIS — I10 Essential (primary) hypertension: Secondary | ICD-10-CM

## 2018-03-28 NOTE — Telephone Encounter (Signed)
Danae Chen, can you please mail patient a copy of blood work results? Thank you

## 2018-03-28 NOTE — Telephone Encounter (Signed)
Mailed out copy of recent labs to patient 03/28/2018.

## 2018-03-28 NOTE — Telephone Encounter (Signed)
Patient's bad cholesterol slightly elevated on last blood draw. Continue Zetia Because the patient does not tolerate statins that is why we are not recommending those. Potassium minimally elevated.  I recommend repeating kidney function in 4 to 6 weeks. Avoid or at least minimize potassium rich foods in diet-bananas orange juice tomato based products It would be fine to have [employer] him a copy

## 2018-03-28 NOTE — Telephone Encounter (Signed)
Labs ordered;left message to return call

## 2018-04-11 ENCOUNTER — Telehealth: Payer: Self-pay | Admitting: Family Medicine

## 2018-04-11 MED ORDER — OMEPRAZOLE 20 MG PO CPDR: DELAYED_RELEASE_CAPSULE | ORAL | 0 refills | Status: DC

## 2018-04-11 NOTE — Addendum Note (Signed)
Addended by: Dairl Ponder on: 04/11/2018 10:56 AM   Modules accepted: Orders

## 2018-04-11 NOTE — Telephone Encounter (Signed)
Last seen 03-22-18.  Patient needing a refill of his omeprazole (PRILOSEC) 20 MG capsule #90 sent to his mail order pharmacy [facility].

## 2018-04-11 NOTE — Telephone Encounter (Addendum)
Prescription sent electronically to pharmacy. Patient notified. 

## 2018-04-12 DIAGNOSIS — C61 Malignant neoplasm of prostate: Secondary | ICD-10-CM | POA: Diagnosis not present

## 2018-04-19 DIAGNOSIS — N5201 Erectile dysfunction due to arterial insufficiency: Secondary | ICD-10-CM | POA: Diagnosis not present

## 2018-04-19 DIAGNOSIS — C61 Malignant neoplasm of prostate: Secondary | ICD-10-CM | POA: Diagnosis not present

## 2018-04-19 DIAGNOSIS — N3946 Mixed incontinence: Secondary | ICD-10-CM | POA: Diagnosis not present

## 2018-06-21 DIAGNOSIS — H61002 Unspecified perichondritis of left external ear: Secondary | ICD-10-CM | POA: Diagnosis not present

## 2018-06-21 DIAGNOSIS — L905 Scar conditions and fibrosis of skin: Secondary | ICD-10-CM | POA: Diagnosis not present

## 2018-06-23 ENCOUNTER — Encounter: Payer: Self-pay | Admitting: Family Medicine

## 2018-06-23 ENCOUNTER — Ambulatory Visit (INDEPENDENT_AMBULATORY_CARE_PROVIDER_SITE_OTHER): Payer: Medicare Other | Admitting: Family Medicine

## 2018-06-23 VITALS — BP 138/86 | Ht 67.5 in | Wt 151.2 lb

## 2018-06-23 DIAGNOSIS — I10 Essential (primary) hypertension: Secondary | ICD-10-CM

## 2018-06-23 DIAGNOSIS — M792 Neuralgia and neuritis, unspecified: Secondary | ICD-10-CM | POA: Diagnosis not present

## 2018-06-23 NOTE — Progress Notes (Signed)
   Subjective:    Patient ID: [name], male    DOB: 10/28/1940, 78 y.o.   MRN: [id number]  Hypertension  This is a chronic problem. The current episode started more than 1 year ago. Pertinent negatives include no chest pain, headaches or shortness of breath. Risk factors for coronary artery disease include dyslipidemia and male gender. Treatments tried: diet. There are no compliance problems.     Patient having intermittent neuralgia of the right foot pain in the heel pain in the toes numbness in the toes distal foot he states feels cold all the time denies any injury does have lumbar spine discomfort intermittently  Review of Systems  Constitutional: Negative for diaphoresis and fatigue.  HENT: Negative for congestion and rhinorrhea.   Respiratory: Negative for cough and shortness of breath.   Cardiovascular: Negative for chest pain and leg swelling.  Gastrointestinal: Negative for abdominal pain and diarrhea.  Skin: Negative for color change and rash.  Neurological: Negative for dizziness and headaches.  Psychiatric/Behavioral: Negative for behavioral problems and confusion.       Objective:   Physical Exam Vitals signs reviewed.  Constitutional:      General: He is not in acute distress. HENT:     Head: Normocephalic and atraumatic.  Eyes:     General:        Right eye: No discharge.        Left eye: No discharge.  Neck:     Trachea: No tracheal deviation.  Cardiovascular:     Rate and Rhythm: Normal rate and regular rhythm.     Heart sounds: Normal heart sounds. No murmur.  Pulmonary:     Effort: Pulmonary effort is normal. No respiratory distress.     Breath sounds: Normal breath sounds.  Lymphadenopathy:     Cervical: No cervical adenopathy.  Skin:    General: Skin is warm and dry.  Neurological:     Mental Status: He is alert.     Coordination: Coordination normal.  Psychiatric:        Behavior: Behavior normal.     Pulses in the feet are normal.   Slight acrocyanosis of the toes but no sign of ischemia Patient has a little foot cushion Nontender on exam     Assessment & Plan:  Blood pressure good control Foot pain I offered nerve conduction studies as well as referral to neurology and podiatry patient defers at this time he will think about it more than likely it is an impingement of a nerve causing the problem I doubt plantar fasciitis

## 2018-07-03 ENCOUNTER — Other Ambulatory Visit: Payer: Self-pay

## 2018-07-03 ENCOUNTER — Telehealth: Payer: Self-pay | Admitting: Family Medicine

## 2018-07-03 MED ORDER — OMEPRAZOLE 20 MG PO CPDR: DELAYED_RELEASE_CAPSULE | ORAL | 1 refills | Status: DC

## 2018-07-03 NOTE — Telephone Encounter (Signed)
°  Requesting refill for: omeprazole (PRILOSEC) 20 MG capsule    Pharmacy:  [facility] [place] [place]

## 2018-07-03 NOTE — Telephone Encounter (Signed)
Patient is aware medication sent to the requested pharmacy.

## 2018-07-11 DIAGNOSIS — Z961 Presence of intraocular lens: Secondary | ICD-10-CM | POA: Diagnosis not present

## 2018-08-28 ENCOUNTER — Encounter: Payer: Self-pay | Admitting: Family Medicine

## 2018-08-28 ENCOUNTER — Other Ambulatory Visit: Payer: Self-pay

## 2018-08-28 ENCOUNTER — Ambulatory Visit (HOSPITAL_COMMUNITY)
Admission: RE | Admit: 2018-08-28 | Discharge: 2018-08-28 | Disposition: A | Discharge status: Home / Self Care | Payer: Medicare Other | Source: Ambulatory Visit | Attending: Family Medicine | Admitting: Family Medicine

## 2018-08-28 ENCOUNTER — Other Ambulatory Visit (HOSPITAL_COMMUNITY)
Admission: RE | Admit: 2018-08-28 | Discharge: 2018-08-28 | Disposition: A | Discharge status: Home / Self Care | Payer: Medicare Other | Source: Ambulatory Visit | Attending: Family Medicine | Admitting: Family Medicine

## 2018-08-28 ENCOUNTER — Ambulatory Visit (INDEPENDENT_AMBULATORY_CARE_PROVIDER_SITE_OTHER): Payer: Medicare Other | Admitting: Family Medicine

## 2018-08-28 VITALS — BP 130/70 | Temp 98.5°F

## 2018-08-28 DIAGNOSIS — R0781 Pleurodynia: Secondary | ICD-10-CM | POA: Diagnosis not present

## 2018-08-28 DIAGNOSIS — R0602 Shortness of breath: Secondary | ICD-10-CM | POA: Diagnosis not present

## 2018-08-28 DIAGNOSIS — R079 Chest pain, unspecified: Secondary | ICD-10-CM | POA: Diagnosis not present

## 2018-08-28 DIAGNOSIS — R7989 Other specified abnormal findings of blood chemistry: Secondary | ICD-10-CM | POA: Diagnosis not present

## 2018-08-28 DIAGNOSIS — J90 Pleural effusion, not elsewhere classified: Secondary | ICD-10-CM | POA: Diagnosis not present

## 2018-08-28 LAB — CBC WITH DIFFERENTIAL/PLATELET
Abs Immature Granulocytes: 0.05 10*3/uL (ref 0.00–0.07)
Basophils Absolute: 0 10*3/uL (ref 0.0–0.1)
Basophils Relative: 1 %
Eosinophils Absolute: 0.1 10*3/uL (ref 0.0–0.5)
Eosinophils Relative: 2 %
HCT: 48.6 % (ref 39.0–52.0)
Hemoglobin: 16.4 g/dL (ref 13.0–17.0)
Immature Granulocytes: 1 %
Lymphocytes Relative: 16 %
Lymphs Abs: 1.3 10*3/uL (ref 0.7–4.0)
MCH: 30.3 pg (ref 26.0–34.0)
MCHC: 33.7 g/dL (ref 30.0–36.0)
MCV: 89.7 fL (ref 80.0–100.0)
Monocytes Absolute: 1.1 10*3/uL — ABNORMAL HIGH (ref 0.1–1.0)
Monocytes Relative: 14 %
Neutro Abs: 5.4 10*3/uL (ref 1.7–7.7)
Neutrophils Relative %: 66 %
Platelets: 199 10*3/uL (ref 150–400)
RBC: 5.42 MIL/uL (ref 4.22–5.81)
RDW: 11.9 % (ref 11.5–15.5)
WBC: 8.1 10*3/uL (ref 4.0–10.5)
nRBC: 0 % (ref 0.0–0.2)

## 2018-08-28 LAB — BASIC METABOLIC PANEL
Anion gap: 8 (ref 5–15)
BUN: 13 mg/dL (ref 8–23)
CO2: 26 mmol/L (ref 22–32)
Calcium: 9.1 mg/dL (ref 8.9–10.3)
Chloride: 93 mmol/L — ABNORMAL LOW (ref 98–111)
Creatinine, Ser: 0.8 mg/dL (ref 0.61–1.24)
GFR calc Af Amer: >60 mL/min (ref >60)
GFR calc non Af Amer: >60 mL/min (ref >60)
Glucose, Bld: 97 mg/dL (ref 70–99)
Potassium: 4.4 mmol/L (ref 3.5–5.1)
Sodium: 127 mmol/L — ABNORMAL LOW (ref 135–145)

## 2018-08-28 LAB — TROPONIN I: Troponin I: <0.03 ng/mL (ref <0.03)

## 2018-08-28 LAB — D-DIMER, QUANTITATIVE: D-Dimer, Quant: 0.72 ug/mL-FEU — ABNORMAL HIGH (ref 0.00–0.50)

## 2018-08-28 MED ORDER — IOHEXOL 350 MG/ML SOLN
100.0000 mL | Freq: Once | INTRAVENOUS | Status: AC | PRN
  Administered 2018-08-28: 15:00:00 100 mL via INTRAVENOUS

## 2018-08-28 MED ORDER — DICLOFENAC SODIUM 75 MG PO TBEC: 75.0000 mg | DELAYED_RELEASE_TABLET | Freq: Two times a day (BID) | ORAL | 0 refills | Status: DC

## 2018-08-28 NOTE — Progress Notes (Signed)
   Subjective:    Patient ID: [name], male    DOB: 10-02-1940, 78 y.o.   MRN: [id number]  HPIchest pain started last Thursday. Pain when taking a deep breath in. Went to ED but left due to the amount of people there.  Patient states he noticed he is having pain on left side of the chest denies any coughing denies any shortness of breath states the pain was in the left side hurts with a deep breath states he never really had this problem before the pain would come and go was not substernal no sweats or chills no fevers.  Denied any vomiting spells diarrhea.  Denied any change with activity.  States he does not really feel short of breath currently He describes it as a sharp pain on left side Does have multiple health problems please see medical record Review of Systems  Constitutional: Negative for activity change, chills and fever.  HENT: Negative for congestion, ear pain and rhinorrhea.   Eyes: Negative for discharge.  Respiratory: Negative for cough and wheezing.   Cardiovascular: Positive for chest pain. Negative for palpitations and leg swelling.  Gastrointestinal: Negative for nausea and vomiting.  Musculoskeletal: Negative for arthralgias.       Objective:   Physical Exam Vitals signs reviewed.  Constitutional:      General: He is not in acute distress. HENT:     Head: Normocephalic and atraumatic.  Eyes:     General:        Right eye: No discharge.        Left eye: No discharge.  Neck:     Trachea: No tracheal deviation.  Cardiovascular:     Rate and Rhythm: Normal rate and regular rhythm.     Heart sounds: Normal heart sounds. No murmur.  Pulmonary:     Effort: Pulmonary effort is normal. No respiratory distress.     Breath sounds: Normal breath sounds.  Lymphadenopathy:     Cervical: No cervical adenopathy.  Skin:    General: Skin is warm and dry.  Neurological:     Mental Status: He is alert.     Coordination: Coordination normal.  Psychiatric:      Behavior: Behavior normal.   EKG no acute changes no significant change compared to previous EKG Patient not respiratory distress able to carry on conversation without any shortness of breath Blood pressure 130/72  O2 saturation 97%    Assessment & Plan:  Chest pain Nonspecific Pleuritic component X-ray ordered Lab work ordered Addendum x-ray shows pleural fluid left side D-dimer elevated Troponin negative  [facility] because his blood pressure was elevated on the phone visit but his blood pressure is absolutely normal it is not necessary for him to be on a blood pressure losartan at this time Stat CTA ordered of the chest  Chest x-ray came back normal with possible pleuritic fluid in the left lower area CTA did not show any pulmonary embolus  We will use an anti-inflammatory and follow-up in a couple days

## 2018-08-30 ENCOUNTER — Other Ambulatory Visit: Payer: Self-pay | Admitting: *Deleted

## 2018-08-30 ENCOUNTER — Other Ambulatory Visit: Payer: Self-pay

## 2018-08-30 ENCOUNTER — Ambulatory Visit (INDEPENDENT_AMBULATORY_CARE_PROVIDER_SITE_OTHER): Payer: Medicare Other | Admitting: Family Medicine

## 2018-08-30 ENCOUNTER — Encounter: Payer: Self-pay | Admitting: Cardiology

## 2018-08-30 ENCOUNTER — Telehealth: Payer: Self-pay | Admitting: Family Medicine

## 2018-08-30 ENCOUNTER — Telehealth: Payer: Self-pay | Admitting: Cardiology

## 2018-08-30 DIAGNOSIS — R0609 Other forms of dyspnea: Secondary | ICD-10-CM | POA: Diagnosis not present

## 2018-08-30 DIAGNOSIS — I209 Angina pectoris, unspecified: Secondary | ICD-10-CM

## 2018-08-30 MED ORDER — NITROGLYCERIN 0.4 MG SL SUBL: 0.4000 mg | SUBLINGUAL_TABLET | SUBLINGUAL | 0 refills | Status: DC | PRN

## 2018-08-30 NOTE — Telephone Encounter (Signed)
Discussed with pt. Pt states he has appt tomorrow with dr Domenic Polite. Pt verbalized understanding on how to take nitro and to stop diclofenac and to start asprin 81 mg daily.

## 2018-08-30 NOTE — Progress Notes (Signed)
   Subjective:    Patient ID: [name], male    DOB: [birth year], 78 y.o.   MRN: [id number] Video and audio/patient at home we were present in office/coronavirus outbreak HPIfollow up on chest pain and sob. Pt states chest pain is better but still having some trouble breathing. States about the same as it was when seen. Tried to do elliptical yesterday and did about 100 steps and was completley gave out.  It should be noted that this patient recently was seen because of left-sided chest pain and some shortness of breath troponin was negative d-dimer elevated CAT scan negative for pulmonary embolism Virtual Visit via Video Note  I connected with [name] on 08/30/18 at 11:00 AM EDT by a video enabled telemedicine application and verified that I am speaking with the correct person using two identifiers.   I discussed the limitations of evaluation and management by telemedicine and the availability of in person appointments. The patient expressed understanding and agreed to proceed.  History of Present Illness:    Observations/Objective:   Assessment and Plan:   Follow Up Instructions:    I discussed the assessment and treatment plan with the patient. The patient was provided an opportunity to ask questions and all were answered. The patient agreed with the plan and demonstrated an understanding of the instructions.   The patient was advised to call back or seek an in-person evaluation if the symptoms worsen or if the condition fails to improve as anticipated.  I provided 15 minutes of non-face-to-face time during this encounter.      Review of Systems     Objective:   Physical Exam        Assessment & Plan:  Patient with significant DOE Also some angina when he was doing strenuous activity Recent work-up for pulmonary embolism and also troponin negative Patient does have risk factors for heart disease It is possible this could also be a COPD issue but tightness in  his chest with activity indicates the need for cardiology consultation and work-up  I did tell the patient that is best for him not to do any strenuous activity until he is further cleared by cardiology

## 2018-08-30 NOTE — H&P (View-Only) (Signed)
Cardiology Office Note  Date: 08/31/2018   ID: [name], DOB [birth year], MRN [id number]  PCP: [name], MD  Consulting Cardiologist: Rozann Lesches, MD   Chief Complaint  Patient presents with  . Shortness of breath and chest tightness    History of Present Illness: [name] is a 78 y.o. male referred for cardiology consultation by Dr. Wolfgang Phoenix for the evaluation of shortness of breath and chest tightness.  He states that last Thursday he suddenly developed a dull ache in his left upper chest, subsequently his left clavicle and both shoulders, and then settling in the substernal area.  He thought that it may have been indigestion as he did have some belching, but this discomfort then turned into a "tightness" with associated shortness of breath and fatigue. He did not sleep well for a few nights.  He did have worsening of shortness of breath and chest tightness with activity, including using his elliptical machine at home.  At no point does he describe any fever or chills, no cough, no changes in stool pattern or appetite, no muscle achiness.  He saw Dr. Wolfgang Phoenix and was referred for lab work which is outlined below and also chest CTA.  The study was performed on on April 20 and was negative for pulmonary embolus, showing incidentally noted aortic atherosclerosis and emphysematous changes.  He has a known history of atherosclerosis by chest CT imaging and underwent previous ischemic evaluation in 2016.  Exercise Myoview at that time was negative for ischemia with LVEF 84%.  Echocardiogram at that time revealed LVEF 55 to 60% without wall motion abnormalities.  He has not undergone ischemic testing since then.  I personally reviewed his ECG today which shows a sinus bradycardia with increased voltage and nonspecific ST changes.  I reviewed his medications, he was just started on aspirin, has already been on Prilosec for treatment of reflux.  He has a history of pituitary  microadenoma resection and is on DDAVP.  He does not take an antihypertensive at this time, although indicates that he was told after a telephone evaluation through the [facility] system that he needed to start on losartan.  Past Medical History:  Diagnosis Date  . Arthritis   . Colon polyps    Adenomatous  . Diverticulosis   . Essential hypertension   . GERD (gastroesophageal reflux disease)   . Hypercholesterolemia   . Pituitary microadenoma ([place])    Status post resection 2009  . Prostate cancer [facility])     Past Surgical History:  Procedure Laterality Date  . BACK SURGERY  1991  . CHOLECYSTECTOMY  08/2007  . PROSTATE SURGERY    . Removal of pituitary tumor  9/09    Current Outpatient Medications  Medication Sig Dispense Refill  . aspirin EC 81 MG tablet Take 81 mg by mouth daily.    Marland Kitchen desmopressin (DDAVP) 0.1 MG tablet Take 0.1 mg by mouth 4 (four) times daily as needed.     Marland Kitchen levothyroxine (SYNTHROID, LEVOTHROID) 88 MCG tablet Take 88 mcg by mouth every other day.    . nitroGLYCERIN (NITROSTAT) 0.4 MG SL tablet Place 1 tablet (0.4 mg total) under the tongue every 5 (five) minutes as needed for chest pain (Call 911 if still having chest pain after taking 3rd tablet). 50 tablet 0  . omeprazole (PRILOSEC) 20 MG capsule TAKE 1 CAPSULE BY MOUTH EVERY DAY 90 capsule 1   No current facility-administered medications for this visit.  Allergies:  Codeine; Losartan; Morphine; Amlodipine; Statins; and Tramadol   Social History: The patient  reports that he has quit smoking. His smoking use included cigarettes. He quit smokeless tobacco use about 40 years ago. He reports current alcohol use. He reports previous drug use.   Family History: The patient's family history includes Clotting disorder in his father; Heart attack in his father; Heart disease in his father.   ROS:  Please see the history of present illness. Otherwise, complete review of systems is positive for intermittent  frequent urination.  All other systems are reviewed and negative.   Physical Exam: VS:  BP 140/71   Temp (!) 97.4 F (36.3 C)   Ht 5\' 7"  (1.702 m)   Wt 147 lb (66.7 kg)   SpO2 97%   BMI 23.02 kg/m , BMI Body mass index is 23.02 kg/m.  Wt Readings from Last 3 Encounters:  08/31/18 147 lb (66.7 kg)  06/23/18 151 lb 3.2 oz (68.6 kg)  03/22/18 148 lb 0.2 oz (67.1 kg)    General: Patient appears comfortable at rest. HEENT: Conjunctiva and lids normal, wearing mask.   Neck: Supple, no elevated JVP or carotid bruits, no thyromegaly. Lungs: Clear to auscultation, nonlabored breathing at rest. Cardiac: Regular rate and rhythm, no S3 or significant systolic murmur, no pericardial rub. Abdomen: Soft, nontender, bowel sounds present. Extremities: No pitting edema, distal pulses 2+. Skin: Warm and dry. Musculoskeletal: No kyphosis. Neuropsychiatric: Alert and oriented x3, affect grossly appropriate.  ECG: I personally reviewed the tracing from 07/26/2016 which showed normal sinus rhythm.  Recent Labwork: 09/08/2017: TSH 1.560 10/04/2017: ALT 16; AST 22 08/28/2018: BUN 13; Creatinine, Ser 0.80; Hemoglobin 16.4; Platelets 199; Potassium 4.4; Sodium 127     Component Value Date/Time   CHOL 207 (H) 03/17/2018 0823   TRIG 117 03/17/2018 0823   HDL 48 03/17/2018 0823   CHOLHDL 4.3 03/17/2018 0823   CHOLHDL 5.1 07/16/2013 0938   VLDL 22 07/16/2013 0938   LDLCALC 136 (H) 03/17/2018 0823    Other Studies Reviewed Today:  Echocardiogram 08/16/2014: Study Conclusions  - Left ventricle: The cavity size was normal. Wall thickness was normal. Systolic function was normal. The estimated ejection fraction was in the range of 55% to 60%. Wall motion was normal; there were no regional wall motion abnormalities. Findings consistent with left ventricular diastolic dysfunction. There was no evidence of elevated ventricular filling pressure by Doppler parameters. - Aortic valve: Mildly  calcified annulus. Trileaflet. - Mitral valve: There was trivial regurgitation. - Tricuspid valve: There was trivial regurgitation.  Carotid Doppler 10/17/2013: IMPRESSION: Mild plaque bilaterally without focal carotid stenosis.  No retrograde flow is noted in the left vertebral artery. Velocities appear within normal limits.  Exercise Myoview 08/16/2014: IMPRESSION: 1. No reversible ischemia or infarction.  2. Normal left ventricular wall motion.  3. Left ventricular ejection fraction 84%  4. Low-risk stress test findings*. Duke treadmill score of 9 supports low risk for major cardiac events.  Chest CTA 08/28/2018: FINDINGS: Cardiovascular: Satisfactory opacification of the pulmonary arteries to the segmental level. No evidence of pulmonary embolism. Normal heart size. No pericardial effusion. Atherosclerosis of thoracic aorta is noted without aneurysm or dissection.  Mediastinum/Nodes: No enlarged mediastinal, hilar, or axillary lymph nodes. Thyroid gland, trachea, and esophagus demonstrate no significant findings.  Lungs/Pleura: No pneumothorax or pleural effusion is noted. Mild emphysematous disease is noted in both lungs. Minimal bilateral posterior basilar subsegmental atelectasis is noted.  Upper Abdomen: No acute abnormality.  Musculoskeletal: No chest  wall abnormality. No acute or significant osseous findings.  Review of the MIP images confirms the above findings.  IMPRESSION: No definite evidence of pulmonary embolus.  Aortic Atherosclerosis (ICD10-I70.0) and Emphysema (ICD10-J43.9).  Assessment and Plan:  1.  Recent onset (started last Thursday) chest discomfort, shortness of breath, and exertional fatigue concerning for unstable angina.  He has had no fevers or chills, no cough, no muscle achiness.  He thought that the symptoms might have been indigestion but he is already on Prilosec. He saw Dr. Wolfgang Phoenix for evaluation with lab work obtained,  elevated d-dimer but no pulmonary embolus by chest CTA.  Troponin I level was negative as well.  ECG shows LVH with nonspecific ST changes.  He has incidentally noted atherosclerosis by CT imaging, prior documented history of hyperlipidemia with statin intolerance, and previously documented hypertension.  He states that he feels a little better this week, but "tested" himself on the elliptical machine yesterday and had recurring symptoms.  We have discussed diagnostic modalities for evaluation of ischemic heart disease, and after reviewing the risks and benefits, he is in agreement to proceed with a diagnostic cardiac catheterization tomorrow.  Continue aspirin, he has nitroglycerin available.  Would not start losartan yet until angiography has been completed.  2.  History of hypertension, has not been on medical therapy but recommended by [facility] system to start on losartan recently.  Continue to track blood pressure with Dr. Wolfgang Phoenix and initiate therapy as appropriate.  3.  History of hyperlipidemia and statin intolerance.  Cardiac catheterization being scheduled.  May need to consider different treatment options such as PCSK9 inhibitor depending on results.  4.  Tobacco abuse in remission.  Current medicines were reviewed with the patient today.   Orders Placed This Encounter  Procedures  . EKG 12-Lead    Disposition: Telehealth visit for follow-up after procedure.  Signed, Satira Sark, MD, [facility] 08/31/2018 8:46 AM    [facility] [place], [place], [place] [address] Phone: [phone]; Fax: [phone]

## 2018-08-30 NOTE — Telephone Encounter (Signed)
Nurses Let the patient know that I did speak with cardiology Andrey Cota  They state that they will be getting [name] a virtual visit with Dr. Domenic Polite for this week  They recommend to have just in case-sublingual nitroglycerin 0.4 mg, please instruct the patient to use 1 sublingual every 5 minutes as needed chest pressure or tightness.  If patient has to use more than 3 in a row he ought to go to the emergency department.  No need to use it unless he is having discomfort  He should stop taking the anti-inflammatory I sent in diclofenac Start 81 mg aspirin 1 daily  Cardiology will be connecting with him and doing a visit this week They stated that they may be doing additional testing depending on where they go from there  Please document patient's understanding thank you

## 2018-08-30 NOTE — Progress Notes (Signed)
Cardiology Office Note  Date: 08/31/2018   ID: [name], DOB [birth year], MRN [id number]  PCP: Lance Drown, MD  Consulting Cardiologist: Lance Lesches, MD   Chief Complaint  Patient presents with  . Shortness of breath and chest tightness    History of Present Illness: [name] is a 78 y.o. male referred for cardiology consultation by Dr. Wolfgang Garrison for the evaluation of shortness of breath and chest tightness.  He states that last Thursday he suddenly developed a dull ache in his left upper chest, subsequently his left clavicle and both shoulders, and then settling in the substernal area.  He thought that it may have been indigestion as he did have some belching, but this discomfort then turned into a "tightness" with associated shortness of breath and fatigue. He did not sleep well for a few nights.  He did have worsening of shortness of breath and chest tightness with activity, including using his elliptical machine at home.  At no point does he describe any fever or chills, no cough, no changes in stool pattern or appetite, no muscle achiness.  He saw Dr. Wolfgang Garrison and was referred for lab work which is outlined below and also chest CTA.  The study was performed on on April 20 and was negative for pulmonary embolus, showing incidentally noted aortic atherosclerosis and emphysematous changes.  He has a known history of atherosclerosis by chest CT imaging and underwent previous ischemic evaluation in 2016.  Exercise Myoview at that time was negative for ischemia with LVEF 84%.  Echocardiogram at that time revealed LVEF 55 to 60% without wall motion abnormalities.  He has not undergone ischemic testing since then.  I personally reviewed his ECG today which shows a sinus bradycardia with increased voltage and nonspecific ST changes.  I reviewed his medications, he was just started on aspirin, has already been on Prilosec for treatment of reflux.  He has a history of pituitary  microadenoma resection and is on DDAVP.  He does not take an antihypertensive at this time, although indicates that he was told after a telephone evaluation through the [facility] system that he needed to start on losartan.  Past Medical History:  Diagnosis Date  . Arthritis   . Colon polyps    Adenomatous  . Diverticulosis   . Essential hypertension   . GERD (gastroesophageal reflux disease)   . Hypercholesterolemia   . Pituitary microadenoma ([place])    Status post resection 2009  . Prostate cancer [facility])     Past Surgical History:  Procedure Laterality Date  . BACK SURGERY  1991  . CHOLECYSTECTOMY  08/2007  . PROSTATE SURGERY    . Removal of pituitary tumor  9/09    Current Outpatient Medications  Medication Sig Dispense Refill  . aspirin EC 81 MG tablet Take 81 mg by mouth daily.    Marland Kitchen desmopressin (DDAVP) 0.1 MG tablet Take 0.1 mg by mouth 4 (four) times daily as needed.     Marland Kitchen levothyroxine (SYNTHROID, LEVOTHROID) 88 MCG tablet Take 88 mcg by mouth every other day.    . nitroGLYCERIN (NITROSTAT) 0.4 MG SL tablet Place 1 tablet (0.4 mg total) under the tongue every 5 (five) minutes as needed for chest pain (Call 911 if still having chest pain after taking 3rd tablet). 50 tablet 0  . omeprazole (PRILOSEC) 20 MG capsule TAKE 1 CAPSULE BY MOUTH EVERY DAY 90 capsule 1   No current facility-administered medications for this visit.  Allergies:  Codeine; Losartan; Morphine; Amlodipine; Statins; and Tramadol   Social History: The patient  reports that he has quit smoking. His smoking use included cigarettes. He quit smokeless tobacco use about 40 years ago. He reports current alcohol use. He reports previous drug use.   Family History: The patient's family history includes Clotting disorder in his father; Heart attack in his father; Heart disease in his father.   ROS:  Please see the history of present illness. Otherwise, complete review of systems is positive for intermittent  frequent urination.  All other systems are reviewed and negative.   Physical Exam: VS:  BP 140/71   Temp (!) 97.4 F (36.3 C)   Ht 5\' 7"  (1.702 m)   Wt 147 lb (66.7 kg)   SpO2 97%   BMI 23.02 kg/m , BMI Body mass index is 23.02 kg/m.  Wt Readings from Last 3 Encounters:  08/31/18 147 lb (66.7 kg)  06/23/18 151 lb 3.2 oz (68.6 kg)  03/22/18 148 lb 0.2 oz (67.1 kg)    General: Patient appears comfortable at rest. HEENT: Conjunctiva and lids normal, wearing mask.   Neck: Supple, no elevated JVP or carotid bruits, no thyromegaly. Lungs: Clear to auscultation, nonlabored breathing at rest. Cardiac: Regular rate and rhythm, no S3 or significant systolic murmur, no pericardial rub. Abdomen: Soft, nontender, bowel sounds present. Extremities: No pitting edema, distal pulses 2+. Skin: Warm and dry. Musculoskeletal: No kyphosis. Neuropsychiatric: Alert and oriented x3, affect grossly appropriate.  ECG: I personally reviewed the tracing from 07/26/2016 which showed normal sinus rhythm.  Recent Labwork: 09/08/2017: TSH 1.560 10/04/2017: ALT 16; AST 22 08/28/2018: BUN 13; Creatinine, Ser 0.80; Hemoglobin 16.4; Platelets 199; Potassium 4.4; Sodium 127     Component Value Date/Time   CHOL 207 (H) 03/17/2018 0823   TRIG 117 03/17/2018 0823   HDL 48 03/17/2018 0823   CHOLHDL 4.3 03/17/2018 0823   CHOLHDL 5.1 07/16/2013 0938   VLDL 22 07/16/2013 0938   LDLCALC 136 (H) 03/17/2018 0823    Other Studies Reviewed Today:  Echocardiogram 08/16/2014: Study Conclusions  - Left ventricle: The cavity size was normal. Wall thickness was normal. Systolic function was normal. The estimated ejection fraction was in the range of 55% to 60%. Wall motion was normal; there were no regional wall motion abnormalities. Findings consistent with left ventricular diastolic dysfunction. There was no evidence of elevated ventricular filling pressure by Doppler parameters. - Aortic valve: Mildly  calcified annulus. Trileaflet. - Mitral valve: There was trivial regurgitation. - Tricuspid valve: There was trivial regurgitation.  Carotid Doppler 10/17/2013: IMPRESSION: Mild plaque bilaterally without focal carotid stenosis.  No retrograde flow is noted in the left vertebral artery. Velocities appear within normal limits.  Exercise Myoview 08/16/2014: IMPRESSION: 1. No reversible ischemia or infarction.  2. Normal left ventricular wall motion.  3. Left ventricular ejection fraction 84%  4. Low-risk stress test findings*. Duke treadmill score of 9 supports low risk for major cardiac events.  Chest CTA 08/28/2018: FINDINGS: Cardiovascular: Satisfactory opacification of the pulmonary arteries to the segmental level. No evidence of pulmonary embolism. Normal heart size. No pericardial effusion. Atherosclerosis of thoracic aorta is noted without aneurysm or dissection.  Mediastinum/Nodes: No enlarged mediastinal, hilar, or axillary lymph nodes. Thyroid gland, trachea, and esophagus demonstrate no significant findings.  Lungs/Pleura: No pneumothorax or pleural effusion is noted. Mild emphysematous disease is noted in both lungs. Minimal bilateral posterior basilar subsegmental atelectasis is noted.  Upper Abdomen: No acute abnormality.  Musculoskeletal: No chest  wall abnormality. No acute or significant osseous findings.  Review of the MIP images confirms the above findings.  IMPRESSION: No definite evidence of pulmonary embolus.  Aortic Atherosclerosis (ICD10-I70.0) and Emphysema (ICD10-J43.9).  Assessment and Plan:  1.  Recent onset (started last Thursday) chest discomfort, shortness of breath, and exertional fatigue concerning for unstable angina.  He has had no fevers or chills, no cough, no muscle achiness.  He thought that the symptoms might have been indigestion but he is already on Prilosec. He saw Dr. Wolfgang Garrison for evaluation with lab work obtained,  elevated d-dimer but no pulmonary embolus by chest CTA.  Troponin I level was negative as well.  ECG shows LVH with nonspecific ST changes.  He has incidentally noted atherosclerosis by CT imaging, prior documented history of hyperlipidemia with statin intolerance, and previously documented hypertension.  He states that he feels a little better this week, but "tested" himself on the elliptical machine yesterday and had recurring symptoms.  We have discussed diagnostic modalities for evaluation of ischemic heart disease, and after reviewing the risks and benefits, he is in agreement to proceed with a diagnostic cardiac catheterization tomorrow.  Continue aspirin, he has nitroglycerin available.  Would not start losartan yet until angiography has been completed.  2.  History of hypertension, has not been on medical therapy but recommended by [facility] system to start on losartan recently.  Continue to track blood pressure with Dr. Wolfgang Garrison and initiate therapy as appropriate.  3.  History of hyperlipidemia and statin intolerance.  Cardiac catheterization being scheduled.  May need to consider different treatment options such as PCSK9 inhibitor depending on results.  4.  Tobacco abuse in remission.  Current medicines were reviewed with the patient today.   Orders Placed This Encounter  Procedures  . EKG 12-Lead    Disposition: Telehealth visit for follow-up after procedure.  Signed, Satira Sark, MD, [facility] 08/31/2018 8:46 AM    [facility] [place], [place], [place] [address] Phone: [phone]; Fax: [phone]

## 2018-08-30 NOTE — Telephone Encounter (Signed)
Erma Heritage, PA-C  Satira Sark, MD; Delfino Lovett T        Just FYI, things were not as straight forward when talking with Luking. He initially had pleuritic pain which was worse with coughing and that is when Luking ordered the D-dimer, Troponin, CTA, etc. He said the patient told him a few days ago that he was having chest discomfort while working out the on the elliptical (which was new and the most concerning part of the story) then today told Luking he was feeling well and had not experienced any recurrent symptoms. When I spoke with Luking, I recommended he go ahead and start him on ASA 81mg  daily and send in an Rx for SL NTG for him to have on hand until he could be evaluated.   I hope that helps to clarify things.   Best,  [place]   Previous Messages    ----- Message -----  From: Satira Sark, MD  Sent: 08/30/2018  2:48 PM EDT  To: Chanda Busing, Erma Heritage, PA-C  Subject: RE: CALL FROM DR. Sallee Lange          I reviewed Dr. Lance Sell note. I also reviewed the patient's previous work-up. If he is truly having progressive symptoms as described, he is probably going to need a heart catheterization in which case he needs to be physically seen in the office. I am off on Friday and doing virtual visits only. The only possibility would be for him to come into [place] office tomorrow morning. My virtual schedule is already full. Please have him come in around 8 AM and we will try to work him in as time allows. If that does not work, he could be seen by the office DOD in [place] next week.  ----- Message -----  From: Delfino Lovett T  Sent: 08/30/2018  2:28 PM EDT  To: Satira Sark, MD  Subject: FW: CALL FROM DR. Sallee Lange          Dr. Domenic Polite,  Please review notes from [place] and Dr. Wolfgang Phoenix. Will you advise if this is ok for your schedule?   Thanks.  Vicky  ----- Message -----  From: Erma Heritage, PA-C  Sent:  08/30/2018  2:13 PM EDT  To: Chanda Busing  Subject: RE: CALL FROM DR. SCOTT LUKING          I just got off the phone with him. He wanted to place a referral on this patient for exertional chest pain. Was evaluated by Dr. Domenic Polite back in 2016 but given the timeframe, would be a New Patient visit. Domenic Polite only has 5 pts in the AM of 4/24. Would see if the patient can do a Virtual Visit at 11:20 that day. Would have to move SM's 11:40 to noon.   Thanks,  [place]  ----- Message -----  From: Delfino Lovett T  Sent: 08/30/2018  1:28 PM EDT  To: Erma Heritage, PA-C  Subject: CALL FROM DR. SCOTT Maple [place],   Just received a telephone call from Dr. Sallee Lange . He was requesting to speak with a PA  In reference to above named patient. He requested to be called on his cell # [phone].   Thank you,   Vicky

## 2018-08-31 ENCOUNTER — Encounter: Payer: Self-pay | Admitting: Cardiology

## 2018-08-31 ENCOUNTER — Other Ambulatory Visit: Payer: Self-pay | Admitting: Cardiology

## 2018-08-31 ENCOUNTER — Ambulatory Visit (INDEPENDENT_AMBULATORY_CARE_PROVIDER_SITE_OTHER): Payer: Medicare Other | Admitting: Cardiology

## 2018-08-31 ENCOUNTER — Telehealth: Payer: Self-pay | Admitting: *Deleted

## 2018-08-31 ENCOUNTER — Telehealth: Payer: Self-pay | Admitting: Cardiology

## 2018-08-31 VITALS — BP 140/71 | Temp 97.4°F | Ht 67.0 in | Wt 147.0 lb

## 2018-08-31 DIAGNOSIS — R0609 Other forms of dyspnea: Secondary | ICD-10-CM

## 2018-08-31 DIAGNOSIS — Z789 Other specified health status: Secondary | ICD-10-CM | POA: Diagnosis not present

## 2018-08-31 DIAGNOSIS — F17201 Nicotine dependence, unspecified, in remission: Secondary | ICD-10-CM | POA: Diagnosis not present

## 2018-08-31 DIAGNOSIS — I10 Essential (primary) hypertension: Secondary | ICD-10-CM | POA: Diagnosis not present

## 2018-08-31 DIAGNOSIS — I200 Unstable angina: Secondary | ICD-10-CM

## 2018-08-31 DIAGNOSIS — E782 Mixed hyperlipidemia: Secondary | ICD-10-CM | POA: Diagnosis not present

## 2018-08-31 DIAGNOSIS — I700 Atherosclerosis of aorta: Secondary | ICD-10-CM

## 2018-08-31 NOTE — Telephone Encounter (Signed)
    COVID-19 Pre-Screening Questions:  . Do you currently have a fever? no . Have you recently travelled on a cruise, internationally, or to [place], [place], [place], [place], [place], or [place], [place] [employer]) ? no  . Have you been in contact with someone that is currently pending confirmation of Covid19 testing or has been confirmed to have the [place] virus?  no . Are you currently experiencing fatigue or cough? no . Are you currently experiencing new or worsening shortness of breath at rest or with exertion? Yes . Have you been in contact with someone that was recently sick with fever/cough/fatigue? no    Pt advised due to Covid-19 pandemic, [facility] is restricting visitors and only patients should present for check-in prior to their procedure. People will not be allowed to enter [facility] with the patient. At this time [facility] is not allowing visitors to all [facility] campuses.   I reviewed Covid-19 screening questions and visitor restrictions with patient.

## 2018-08-31 NOTE — Telephone Encounter (Signed)
Pre-cert Verification for the following procedure    Left ht cath 4/24,

## 2018-08-31 NOTE — Patient Instructions (Signed)
    [place] [address] Dept: [phone] Loc: [phone]  [name]  08/31/2018  You are scheduled for a Cardiac Catheterization on Friday, April 24 with Dr. Shelva Majestic.  1. Please arrive at the [facility] (Main Entrance A) at [facility]: [address] [place], [place] [address] at 7:00 AM (This time is two hours before your procedure to ensure your preparation). Free valet parking service is available.   Special note: Every effort is made to have your procedure done on time. Please understand that emergencies sometimes delay scheduled procedures.  2. Diet: Do not eat solid foods after midnight.  The patient may have clear liquids until 5am upon the day of the procedure.  3. Labs: Already done 4. Medication instructions in preparation for your procedure:   Contrast Allergy: No    On the morning of your procedure, take your Aspirin 81 mg  and any morning medicines NOT listed above.  You may use sips of water.  5. Plan for one night stay--bring personal belongings. 6. Bring a current list of your medications and current insurance cards. 7. You MUST have a responsible person to drive you home. 8. Someone MUST be with you the first 24 hours after you arrive home or your discharge will be delayed. 9. Please wear clothes that are easy to get on and off and wear slip-on shoes.  Thank you for allowing [place] to care for you!   -- [facility]

## 2018-09-01 ENCOUNTER — Ambulatory Visit (HOSPITAL_COMMUNITY)
Admission: RE | Admit: 2018-09-01 | Discharge: 2018-09-01 | Disposition: A | Discharge status: Home / Self Care | Payer: Medicare Other | Attending: Cardiovascular Disease | Admitting: Cardiovascular Disease

## 2018-09-01 ENCOUNTER — Other Ambulatory Visit: Payer: Self-pay

## 2018-09-01 ENCOUNTER — Encounter (HOSPITAL_COMMUNITY)
Admission: RE | Disposition: A | Discharge status: Home / Self Care | Payer: Self-pay | Source: Home / Self Care | Attending: Cardiovascular Disease

## 2018-09-01 DIAGNOSIS — I2510 Atherosclerotic heart disease of native coronary artery without angina pectoris: Secondary | ICD-10-CM

## 2018-09-01 DIAGNOSIS — E7800 Pure hypercholesterolemia, unspecified: Secondary | ICD-10-CM | POA: Diagnosis present

## 2018-09-01 DIAGNOSIS — Z87891 Personal history of nicotine dependence: Secondary | ICD-10-CM | POA: Diagnosis not present

## 2018-09-01 DIAGNOSIS — I10 Essential (primary) hypertension: Secondary | ICD-10-CM | POA: Diagnosis present

## 2018-09-01 DIAGNOSIS — Z888 Allergy status to other drugs, medicaments and biological substances status: Secondary | ICD-10-CM | POA: Diagnosis not present

## 2018-09-01 DIAGNOSIS — D352 Benign neoplasm of pituitary gland: Secondary | ICD-10-CM | POA: Insufficient documentation

## 2018-09-01 DIAGNOSIS — R0789 Other chest pain: Secondary | ICD-10-CM | POA: Diagnosis present

## 2018-09-01 DIAGNOSIS — E039 Hypothyroidism, unspecified: Secondary | ICD-10-CM | POA: Diagnosis not present

## 2018-09-01 DIAGNOSIS — Z79890 Hormone replacement therapy (postmenopausal): Secondary | ICD-10-CM | POA: Insufficient documentation

## 2018-09-01 DIAGNOSIS — Z885 Allergy status to narcotic agent status: Secondary | ICD-10-CM | POA: Insufficient documentation

## 2018-09-01 DIAGNOSIS — Z7982 Long term (current) use of aspirin: Secondary | ICD-10-CM | POA: Diagnosis not present

## 2018-09-01 DIAGNOSIS — Z79899 Other long term (current) drug therapy: Secondary | ICD-10-CM | POA: Diagnosis not present

## 2018-09-01 DIAGNOSIS — I25119 Atherosclerotic heart disease of native coronary artery with unspecified angina pectoris: Secondary | ICD-10-CM | POA: Insufficient documentation

## 2018-09-01 DIAGNOSIS — Z8249 Family history of ischemic heart disease and other diseases of the circulatory system: Secondary | ICD-10-CM | POA: Diagnosis not present

## 2018-09-01 DIAGNOSIS — K219 Gastro-esophageal reflux disease without esophagitis: Secondary | ICD-10-CM | POA: Diagnosis not present

## 2018-09-01 DIAGNOSIS — M1990 Unspecified osteoarthritis, unspecified site: Secondary | ICD-10-CM | POA: Diagnosis not present

## 2018-09-01 DIAGNOSIS — I200 Unstable angina: Secondary | ICD-10-CM

## 2018-09-01 HISTORY — PX: LEFT HEART CATH AND CORONARY ANGIOGRAPHY: CATH118249

## 2018-09-01 SURGERY — LEFT HEART CATH AND CORONARY ANGIOGRAPHY
Anesthesia: LOCAL

## 2018-09-01 MED ORDER — VERAPAMIL HCL 2.5 MG/ML IV SOLN
INTRAVENOUS | Status: DC | PRN
  Administered 2018-09-01: 10 mL via INTRA_ARTERIAL

## 2018-09-01 MED ORDER — TICAGRELOR 90 MG PO TABS
ORAL_TABLET | ORAL | Status: DC | PRN
  Administered 2018-09-01: 180 mg via ORAL

## 2018-09-01 MED ORDER — IOHEXOL 350 MG/ML SOLN
INTRAVENOUS | Status: DC | PRN
  Administered 2018-09-01: 95 mL via INTRACARDIAC

## 2018-09-01 MED ORDER — ISOSORBIDE MONONITRATE ER 30 MG PO TB24: 30.0000 mg | ORAL_TABLET | Freq: Every day | ORAL | 3 refills | Status: DC

## 2018-09-01 MED ORDER — METOPROLOL TARTRATE 12.5 MG HALF TABLET: 12.5000 mg | ORAL_TABLET | Freq: Two times a day (BID) | ORAL | Status: DC

## 2018-09-01 MED ORDER — VERAPAMIL HCL 2.5 MG/ML IV SOLN
INTRAVENOUS | Status: AC
  Filled 2018-09-01: qty 2

## 2018-09-01 MED ORDER — ISOSORBIDE MONONITRATE ER 30 MG PO TB24
30.0000 mg | ORAL_TABLET | Freq: Every day | ORAL | Status: DC
  Administered 2018-09-01: 30 mg via ORAL
  Filled 2018-09-01: qty 1

## 2018-09-01 MED ORDER — SODIUM CHLORIDE 0.9% FLUSH: 3.0000 mL | INTRAVENOUS | Status: DC | PRN

## 2018-09-01 MED ORDER — SODIUM CHLORIDE 0.9 % WEIGHT BASED INFUSION
3.0000 mL/kg/h | INTRAVENOUS | Status: AC
  Administered 2018-09-01: 3 mL/kg/h via INTRAVENOUS

## 2018-09-01 MED ORDER — SODIUM CHLORIDE 0.9% FLUSH: 3.0000 mL | Freq: Two times a day (BID) | INTRAVENOUS | Status: DC

## 2018-09-01 MED ORDER — METOPROLOL TARTRATE 12.5 MG HALF TABLET
12.5000 mg | ORAL_TABLET | Freq: Two times a day (BID) | ORAL | Status: DC
  Administered 2018-09-01: 12.5 mg via ORAL
  Filled 2018-09-01: qty 1

## 2018-09-01 MED ORDER — EZETIMIBE 10 MG PO TABS
10.0000 mg | ORAL_TABLET | Freq: Every day | ORAL | Status: DC
  Administered 2018-09-01: 10 mg via ORAL
  Filled 2018-09-01: qty 1

## 2018-09-01 MED ORDER — SODIUM CHLORIDE 0.9 % IV SOLN: INTRAVENOUS | Status: AC

## 2018-09-01 MED ORDER — ACETAMINOPHEN 325 MG PO TABS: 650.0000 mg | ORAL_TABLET | ORAL | Status: DC | PRN

## 2018-09-01 MED ORDER — EZETIMIBE 10 MG PO TABS: 10.0000 mg | ORAL_TABLET | Freq: Every day | ORAL | 3 refills | Status: DC

## 2018-09-01 MED ORDER — HEPARIN SODIUM (PORCINE) 1000 UNIT/ML IJ SOLN
INTRAMUSCULAR | Status: DC | PRN
  Administered 2018-09-01: 5000 [IU] via INTRAVENOUS
  Administered 2018-09-01: 3500 [IU] via INTRAVENOUS

## 2018-09-01 MED ORDER — MIDAZOLAM HCL 2 MG/2ML IJ SOLN
INTRAMUSCULAR | Status: DC | PRN
  Administered 2018-09-01: 1 mg via INTRAVENOUS

## 2018-09-01 MED ORDER — LIDOCAINE HCL (PF) 1 % IJ SOLN
INTRAMUSCULAR | Status: DC | PRN
  Administered 2018-09-01: 2 mL

## 2018-09-01 MED ORDER — LABETALOL HCL 5 MG/ML IV SOLN: 10.0000 mg | INTRAVENOUS | Status: AC | PRN

## 2018-09-01 MED ORDER — EZETIMIBE 10 MG PO TABS: 10.0000 mg | ORAL_TABLET | Freq: Every day | ORAL | Status: DC

## 2018-09-01 MED ORDER — ASPIRIN 81 MG PO CHEW: 81.0000 mg | CHEWABLE_TABLET | Freq: Every day | ORAL | Status: DC

## 2018-09-01 MED ORDER — ONDANSETRON HCL 4 MG/2ML IJ SOLN: 4.0000 mg | Freq: Four times a day (QID) | INTRAMUSCULAR | Status: DC | PRN

## 2018-09-01 MED ORDER — TICAGRELOR 90 MG PO TABS
ORAL_TABLET | ORAL | Status: AC
  Filled 2018-09-01: qty 2

## 2018-09-01 MED ORDER — SODIUM CHLORIDE 0.9 % IV SOLN: 250.0000 mL | INTRAVENOUS | Status: DC | PRN

## 2018-09-01 MED ORDER — LIDOCAINE HCL (PF) 1 % IJ SOLN
INTRAMUSCULAR | Status: AC
  Filled 2018-09-01: qty 30

## 2018-09-01 MED ORDER — HEPARIN (PORCINE) IN NACL 1000-0.9 UT/500ML-% IV SOLN
INTRAVENOUS | Status: DC | PRN
  Administered 2018-09-01 (×3): 500 mL

## 2018-09-01 MED ORDER — METOPROLOL TARTRATE 25 MG PO TABS: 12.5000 mg | ORAL_TABLET | Freq: Two times a day (BID) | ORAL | 3 refills | Status: DC

## 2018-09-01 MED ORDER — ISOSORBIDE MONONITRATE ER 30 MG PO TB24: 30.0000 mg | ORAL_TABLET | Freq: Every day | ORAL | Status: DC

## 2018-09-01 MED ORDER — SODIUM CHLORIDE 0.9 % WEIGHT BASED INFUSION: 1.0000 mL/kg/h | INTRAVENOUS | Status: DC

## 2018-09-01 MED ORDER — HYDRALAZINE HCL 20 MG/ML IJ SOLN: 10.0000 mg | INTRAMUSCULAR | Status: AC | PRN

## 2018-09-01 MED ORDER — HEPARIN (PORCINE) IN NACL 1000-0.9 UT/500ML-% IV SOLN
INTRAVENOUS | Status: AC
  Filled 2018-09-01: qty 1000

## 2018-09-01 MED ORDER — MIDAZOLAM HCL 2 MG/2ML IJ SOLN
INTRAMUSCULAR | Status: AC
  Filled 2018-09-01: qty 2

## 2018-09-01 MED ORDER — HEPARIN SODIUM (PORCINE) 1000 UNIT/ML IJ SOLN
INTRAMUSCULAR | Status: AC
  Filled 2018-09-01: qty 1

## 2018-09-01 MED ORDER — ASPIRIN 81 MG PO CHEW: 81.0000 mg | CHEWABLE_TABLET | ORAL | Status: DC

## 2018-09-01 SURGICAL SUPPLY — 13 items
CATH INFINITI 5FR ANG PIGTAIL (CATHETERS) ×1 IMPLANT
CATH LAUNCHER 6FR JR4 (CATHETERS) ×1 IMPLANT
CATH OPTITORQUE TIG 4.0 5F (CATHETERS) ×1 IMPLANT
DEVICE RAD COMP TR BAND LRG (VASCULAR PRODUCTS) ×1 IMPLANT
GLIDESHEATH SLEND SS 6F .021 (SHEATH) ×1 IMPLANT
GUIDEWIRE INQWIRE 1.5J.035X260 (WIRE) IMPLANT
INQWIRE 1.5J .035X260CM (WIRE) ×2
KIT ENCORE 26 ADVANTAGE (KITS) ×1 IMPLANT
KIT HEART LEFT (KITS) ×2 IMPLANT
PACK CARDIAC CATHETERIZATION (CUSTOM PROCEDURE TRAY) ×2 IMPLANT
SYR MEDRAD MARK 7 150ML (SYRINGE) ×2 IMPLANT
TRANSDUCER W/STOPCOCK (MISCELLANEOUS) ×2 IMPLANT
TUBING CIL FLEX 10 FLL-RA (TUBING) ×2 IMPLANT

## 2018-09-01 NOTE — Progress Notes (Signed)
When I removed the tr band pt had a slight ooze from right radial puncture site. Pressure was held 10 minutes and still had oozing, cath lab was called, Maryelizabeth Rowan. Came and re applied tr band and placed 4 cc of air because band placement alone  wasn't enough to control, 2 cc removed in 5 minutes and site is clean, Dr Claiborne Billings was called/ updated and asked to come and assess.  Dr Claiborne Billings came and assessed and pt will stay 2 more hours.

## 2018-09-01 NOTE — Progress Notes (Signed)
Discharge instructions reviewed with pt wife via telephone voices understanding.

## 2018-09-01 NOTE — Discharge Summary (Signed)
Discharge Summary    Patient ID: [name] MRN: [id number]; DOB: Sep 02, 1940  Admit date: 09/01/2018 Discharge date: 09/01/2018  Primary Care Provider: [name], MD  Primary Cardiologist: Rozann Lesches, MD  Primary Electrophysiologist:  None   Discharge Diagnoses    Principal Problem:   Unstable angina [facility]) Active Problems:   HYPERCHOLESTEROLEMIA   Essential hypertension   Hypothyroidism   Allergies Allergies  Allergen Reactions  . Codeine   . Losartan     Patient had elevated potassium and also slight elevation of creatinine-October 2017  . Morphine   . Amlodipine     Complained of neck pain with medication-November 2017  . Statins Other (See Comments)    Body aches intolerant to statins  . Tramadol     Sweat, light-headed    Diagnostic Studies/Procedures    Cath 09/01/2018  Mid RCA lesion is 30% stenosed.  Prox RCA to Mid RCA lesion is 80% stenosed.  Ost 1st Mrg to 1st Mrg lesion is 30% stenosed.  Ost 1st Diag to 1st Diag lesion is 20% stenosed.  Prox LAD lesion is 20% stenosed.  The left ventricular ejection fraction is 50-55% by visual estimate.  The left ventricular systolic function is normal.  LV end diastolic pressure is mildly elevated.   Low normal LV function with ejection fraction estimated at 50 to 55%.  LVEDP 18 mm.  Non-obstructive CAD involving the LAD, diagonal, and dominant left circumflex coronary artery with 20 and 30% narrowings.  Nondominant RCA with shepherd's crook takeoff and 80% stenosis on a bend in the in the proximal to mid vessel and 30% mid stenosis.  RECOMMENDATION: Since the patient was not on any anti-ischemic medication nor lipid-lowering therapy, since his RCA is a nondominant vessel and small caliber recommend an initial attempt at aggressive medical therapy for his CAD.  If medical therapy fails, then consider PCI to his 80% RCA lesion.  At present, will add low-dose nitrates, beta-blocker therapy, and  with his reported statin intolerance, initiate Zetia.  However patient should be considered for PCSK9 inhibition or consider rechallenge with low-dose statin.  Also consider possible addition of amlodipine for anti-ischemic medical therapy in addition to hypertension control with ideal blood pressure less than 120/80 and LDL < 70.  _____________   History of Present Illness     [name] is a 78 y.o. male referred for cardiology consultation by Dr. Wolfgang Phoenix for the evaluation of shortness of breath and chest tightness.  He states that last Thursday he suddenly developed a dull ache in his left upper chest, subsequently his left clavicle and both shoulders, and then settling in the substernal area.  He thought that it may have been indigestion as he did have some belching, but this discomfort then turned into a "tightness" with associated shortness of breath and fatigue. He did not sleep well for a few nights.  He did have worsening of shortness of breath and chest tightness with activity, including using his elliptical machine at home.  At no point does he describe any fever or chills, no cough, no changes in stool pattern or appetite, no muscle achiness.  He saw Dr. Wolfgang Phoenix and was referred for lab work which is outlined below and also chest CTA.  The study was performed on on April 20 and was negative for pulmonary embolus, showing incidentally noted aortic atherosclerosis and emphysematous changes.  He has a known history of atherosclerosis by chest CT imaging and underwent previous ischemic evaluation in 2016.  Exercise Myoview at that time was negative for ischemia with LVEF 84%.  Echocardiogram at that time revealed LVEF 55 to 60% without wall motion abnormalities.  He has not undergone ischemic testing since then.  I personally reviewed his ECG today which shows a sinus bradycardia with increased voltage and nonspecific ST changes.  I reviewed his medications, he was just started on aspirin,  has already been on Prilosec for treatment of reflux.  He has a history of pituitary microadenoma resection and is on DDAVP.  He does not take an antihypertensive at this time, although indicates that he was told after a telephone evaluation through the [facility] system that he needed to start on losartan.  Hospital Course     Consultants: N/A  Patient underwent cardiac catheterization on 09/01/2018 which revealed 30% mid RCA disease, 80% proximal to mid RCA disease located in the small nondominant RCA.  Medical therapy was recommended at this time, low-dose nitrate, Lopressor and Zetia was added to his medical regimen.  If patient fails medical therapy, can then consider PCI to his 80% RCA lesion.  He will need a repeat lipid panel on future follow-up, if LDL remains elevated greater than 70, he will need to be considered for PCSK9 inhibitor.  I contacted the patient prior to discharge.  He has a iPhone and willing to do video conference visit.  He has a follow-up with Dr. Domenic Polite in May, this likely will be transition to a telehealth visit.  The visit can be done on the same day using either Doximity or doxy.me text feature.  Consent has been given to the patient.  _____________  Discharge Vitals Blood pressure (!) 173/63, pulse (!) 58, temperature 97.8 F (36.6 C), temperature source Oral, resp. rate 18, height 5\' 7"  (1.702 m), weight 66.7 kg, SpO2 98 %.  Filed Weights   09/01/18 0659  Weight: 66.7 kg    Labs & Radiologic Studies    CBC No results for input(s): WBC, NEUTROABS, HGB, HCT, MCV, PLT in the last 72 hours. Basic Metabolic Panel No results for input(s): NA, K, CL, CO2, GLUCOSE, BUN, CREATININE, CALCIUM, MG, PHOS in the last 72 hours. Liver Function Tests No results for input(s): AST, ALT, ALKPHOS, BILITOT, PROT, ALBUMIN in the last 72 hours. No results for input(s): LIPASE, AMYLASE in the last 72 hours. Cardiac Enzymes No results for input(s): CKTOTAL, CKMB, CKMBINDEX,  TROPONINI in the last 72 hours. BNP Invalid input(s): POCBNP D-Dimer No results for input(s): DDIMER in the last 72 hours. Hemoglobin A1C No results for input(s): HGBA1C in the last 72 hours. Fasting Lipid Panel No results for input(s): CHOL, HDL, LDLCALC, TRIG, CHOLHDL, LDLDIRECT in the last 72 hours. Thyroid Function Tests No results for input(s): TSH, T4TOTAL, T3FREE, THYROIDAB in the last 72 hours.  Invalid input(s): FREET3 _____________  Dg Chest 2 View  Result Date: 08/28/2018 CLINICAL DATA:  Four-day history of chest pain, BILATERAL shoulder pain, cough and fatigue. Former smoker. EXAM: CHEST - 2 VIEW COMPARISON:  07/26/2016 and earlier, including CTA chest 10/16/2013. FINDINGS: Cardiac silhouette normal in size, unchanged. Thoracic aorta atherosclerotic, unchanged. Hilar and mediastinal contours otherwise unremarkable. Stable mild hyperinflation. Small LEFT pleural effusion. Lungs clear. Bronchovascular markings normal. Pulmonary vascularity normal. IMPRESSION: 1. Small LEFT pleural effusion. No acute cardiopulmonary disease otherwise. 2. Stable mild hyperinflation indicating COPD and/or asthma. Electronically Signed   By: Evangeline Dakin M.D.   On: 08/28/2018 12:15   Ct Angio Chest W/cm &/or Wo Cm  Result Date: 08/28/2018  CLINICAL DATA:  Chest pain, shortness of breath. History of prostate cancer. EXAM: CT ANGIOGRAPHY CHEST WITH CONTRAST TECHNIQUE: Multidetector CT imaging of the chest was performed using the standard protocol during bolus administration of intravenous contrast. Multiplanar CT image reconstructions and MIPs were obtained to evaluate the vascular anatomy. CONTRAST:  171mL OMNIPAQUE IOHEXOL 350 MG/ML SOLN COMPARISON:  Radiographs of same day.  CT scan of October 16, 2013. FINDINGS: Cardiovascular: Satisfactory opacification of the pulmonary arteries to the segmental level. No evidence of pulmonary embolism. Normal heart size. No pericardial effusion. Atherosclerosis of  thoracic aorta is noted without aneurysm or dissection. Mediastinum/Nodes: No enlarged mediastinal, hilar, or axillary lymph nodes. Thyroid gland, trachea, and esophagus demonstrate no significant findings. Lungs/Pleura: No pneumothorax or pleural effusion is noted. Mild emphysematous disease is noted in both lungs. Minimal bilateral posterior basilar subsegmental atelectasis is noted. Upper Abdomen: No acute abnormality. Musculoskeletal: No chest wall abnormality. No acute or significant osseous findings. Review of the MIP images confirms the above findings. IMPRESSION: No definite evidence of pulmonary embolus. Aortic Atherosclerosis (ICD10-I70.0) and Emphysema (ICD10-J43.9). Electronically Signed   By: Marijo Conception M.D.   On: 08/28/2018 15:35   Disposition   Pt is being discharged home today in good condition.  Follow-up Plans & Appointments    Follow-up Information    Satira Sark, MD Follow up on 09/29/2018.   Specialty:  Cardiology Why:  1:00PM. Cardiology followup Contact information: [place] [address] [phone]            Discharge Medications   Allergies as of 09/01/2018      Reactions   Codeine    Losartan    Patient had elevated potassium and also slight elevation of creatinine-October 2017   Morphine    Amlodipine    Complained of neck pain with medication-November 2017   Statins Other (See Comments)   Body aches intolerant to statins   Tramadol    Sweat, light-headed      Medication List    TAKE these medications   aspirin EC 81 MG tablet Take 81 mg by mouth daily.   desmopressin 0.1 MG tablet Commonly known as:  DDAVP Take 0.1 mg by mouth 4 (four) times daily as needed (Pt takes 3 to 4 times daily).   diclofenac 75 MG EC tablet Commonly known as:  VOLTAREN Take 75 mg by mouth 2 (two) times daily.   ezetimibe 10 MG tablet Commonly known as:  ZETIA Take 1 tablet (10 mg total) by mouth daily.   isosorbide  mononitrate 30 MG 24 hr tablet Commonly known as:  IMDUR Take 1 tablet (30 mg total) by mouth daily.   levothyroxine 88 MCG tablet Commonly known as:  SYNTHROID Take 88 mcg by mouth every other day.   metoprolol tartrate 25 MG tablet Commonly known as:  LOPRESSOR Take 0.5 tablets (12.5 mg total) by mouth 2 (two) times daily.   nitroGLYCERIN 0.4 MG SL tablet Commonly known as:  NITROSTAT Place 1 tablet (0.4 mg total) under the tongue every 5 (five) minutes as needed for chest pain (Call 911 if still having chest pain after taking 3rd tablet).   omeprazole 20 MG capsule Commonly known as:  PRILOSEC TAKE 1 CAPSULE BY MOUTH EVERY DAY        Acute coronary syndrome (MI, NSTEMI, STEMI, etc) this admission?: No.    Outstanding Labs/Studies   FLP and LFT in 2 month  Duration of Discharge Encounter  Greater than 30 minutes including physician time.  Hilbert Corrigan, PA 09/01/2018, 3:02 PM

## 2018-09-01 NOTE — Discharge Instructions (Signed)
Drink plenty of fluids Keep right arm at or above heart level  Radial Site Care  This sheet gives you information about how to care for yourself after your procedure. Your health care provider may also give you more specific instructions. If you have problems or questions, contact your health care provider. What can I expect after the procedure? After the procedure, it is common to have:  Bruising and tenderness at the catheter insertion area. Follow these instructions at home: Medicines  Take over-the-counter and prescription medicines only as told by your health care provider. Insertion site care  Follow instructions from your health care provider about how to take care of your insertion site. Make sure you: ? Wash your hands with soap and water before you change your bandage (dressing). If soap and water are not available, use hand sanitizer. ? Change your dressing as told by your health care provider. ? Leave stitches (sutures), skin glue, or adhesive strips in place. These skin closures may need to stay in place for 2 weeks or longer. If adhesive strip edges start to loosen and curl up, you may trim the loose edges. Do not remove adhesive strips completely unless your health care provider tells you to do that.  Check your insertion site every day for signs of infection. Check for: ? Redness, swelling, or pain. ? Fluid or blood. ? Pus or a bad smell. ? Warmth.  Do not take baths, swim, or use a hot tub until your health care provider approves.  You may shower 24-48 hours after the procedure, or as directed by your health care provider. ? Remove the dressing and gently wash the site with plain soap and water. ? Pat the area dry with a clean towel. ? Do not rub the site. That could cause bleeding.  Do not apply powder or lotion to the site. Activity   For 24 hours after the procedure, or as directed by your health care provider: ? Do not flex or bend the affected  arm. ? Do not push or pull heavy objects with the affected arm. ? Do not drive yourself home from the hospital or clinic. You may drive 24 hours after the procedure unless your health care provider tells you not to. ? Do not operate machinery or power tools.  Do not lift anything that is heavier than 10 lb (4.5 kg), or the limit that you are told, until your health care provider says that it is safe.  Ask your health care provider when it is okay to: ? Return to work or school. ? Resume usual physical activities or sports. ? Resume sexual activity. General instructions  If the catheter site starts to bleed, raise your arm and put firm pressure on the site. If the bleeding does not stop, get help right away. This is a medical emergency.  If you went home on the same day as your procedure, a responsible adult should be with you for the first 24 hours after you arrive home.  Keep all follow-up visits as told by your health care provider. This is important. Contact a health care provider if:  You have a fever.  You have redness, swelling, or yellow drainage around your insertion site. Get help right away if:  You have unusual pain at the radial site.  The catheter insertion area swells very fast.  The insertion area is bleeding, and the bleeding does not stop when you hold steady pressure on the area.  Your arm or hand becomes pale, cool, tingly, or numb. These symptoms may represent a serious problem that is an emergency. Do not wait to see if the symptoms will go away. Get medical help right away. Call your local emergency services (911 in the [place] Do not drive yourself to the hospital. Summary  After the procedure, it is common to have bruising and tenderness at the site.  Follow instructions from your health care provider about how to take care of your radial site wound. Check the wound every day for signs of infection.  Do not lift anything that is heavier than 10 lb (4.5  kg), or the limit that you are told, until your health care provider says that it is safe. This information is not intended to replace advice given to you by your health care provider. Make sure you discuss any questions you have with your health care provider. Document Released: 05/29/2010 Document Revised: 06/01/2017 Document Reviewed: 06/01/2017 Elsevier Interactive Patient Education  2019 [facility] CARDIOLOGY TEAM HAS ARRANGED FOR AN E-VISIT FOR YOUR APPOINTMENT - PLEASE REVIEW IMPORTANT INFORMATION BELOW SEVERAL DAYS PRIOR TO YOUR APPOINTMENT  Due to the recent COVID-19 pandemic, we are transitioning in-person office visits to tele-medicine visits in an effort to decrease unnecessary exposure to our patients, their families, and staff. These visits are billed to your insurance just like a normal visit is. We also encourage you to sign up for MyChart if you have not already done so. You will need a smartphone if possible. For patients that do not have this, we can still complete the visit using a regular telephone but do prefer a smartphone to enable video when possible. You may have a family member that lives with you that can help. If possible, we also ask that you have a blood pressure cuff and scale at home to measure your blood pressure, heart rate and weight prior to your scheduled appointment. Patients with clinical needs that need an in-person evaluation and testing will still be able to come to the office if absolutely necessary. If you have any questions, feel free to call our office.     YOUR PROVIDER WILL BE USING THE FOLLOWING PLATFORM TO COMPLETE YOUR VISIT: Video visit using IPhone via either doximity or doxy.me   IF USING MYCHART - How to Download the MyChart App to Your SmartPhone   - If Apple, go to [employer] and type in MyChart in the search bar and download the app. If Android, ask patient to go to [employer] and type in [facility] in the search bar and download  the app. The app is free but as with any other app downloads, your phone may require you to verify saved payment information or Apple/Android password.  - You will need to then log into the app with your MyChart username and password, and select [facility] as your healthcare provider to link the account.  - When it is time for your visit, go to the MyChart app, find appointments, and click Begin Video Visit. Be sure to Select Allow for your device to access the Microphone and Camera for your visit. You will then be connected, and your provider will be with you shortly.  **If you have any issues connecting or need assistance, please contact MyChart service desk [phone])**  **If using a computer, in order to ensure the best quality for your visit, you will need to use either of the following Internet Browsers: Google Chrome or  Microsoft Edge**   IF USING DOXIMITY or DOXY.ME - The staff will give you instructions on receiving your link to join the meeting the day of your visit.      2-3 DAYS BEFORE YOUR APPOINTMENT  You will receive a telephone call from one of our [facility] team members - your caller ID may say "Unknown caller." If this is a video visit, we will walk you through how to get the video launched on your phone. We will remind you check your blood pressure, heart rate and weight prior to your scheduled appointment. If you have an Apple Watch or Kardia, please upload any pertinent ECG strips the day before or morning of your appointment to [facility]. Our staff will also make sure you have reviewed the consent and agree to move forward with your scheduled tele-health visit.     THE DAY OF YOUR APPOINTMENT  Approximately 15 minutes prior to your scheduled appointment, you will receive a telephone call from one of [facility] team - your caller ID may say "Unknown caller."  Our staff will confirm medications, vital signs for the day and any symptoms you may be experiencing.  Please have this information available prior to the time of visit start. It may also be helpful for you to have a pad of paper and pen handy for any instructions given during your visit. They will also walk you through joining the smartphone meeting if this is a video visit.    CONSENT FOR TELE-HEALTH VISIT - PLEASE REVIEW  I hereby voluntarily request, consent and authorize [facility] and its employed or contracted physicians, physician assistants, nurse practitioners or other licensed health care professionals (the Practitioner), to provide me with telemedicine health care services (the Services") as deemed necessary by the treating Practitioner. I acknowledge and consent to receive the Services by the Practitioner via telemedicine. I understand that the telemedicine visit will involve communicating with the Practitioner through live audiovisual communication technology and the disclosure of certain medical information by electronic transmission. I acknowledge that I have been given the opportunity to request an in-person assessment or other available alternative prior to the telemedicine visit and am voluntarily participating in the telemedicine visit.  I understand that I have the right to withhold or withdraw my consent to the use of telemedicine in the course of my care at any time, without affecting my right to future care or treatment, and that the Practitioner or I may terminate the telemedicine visit at any time. I understand that I have the right to inspect all information obtained and/or recorded in the course of the telemedicine visit and may receive copies of available information for a reasonable fee.  I understand that some of the potential risks of receiving the Services via telemedicine include:   Delay or interruption in medical evaluation due to technological equipment failure or disruption;  Information transmitted may not be sufficient (e.g. poor resolution of images) to allow  for appropriate medical decision making by the Practitioner; and/or   In rare instances, security protocols could fail, causing a breach of personal health information.  Furthermore, I acknowledge that it is my responsibility to provide information about my medical history, conditions and care that is complete and accurate to the best of my ability. I acknowledge that Practitioner's advice, recommendations, and/or decision may be based on factors not within their control, such as incomplete or inaccurate data provided by me or distortions of diagnostic images or specimens that may result from electronic transmissions. I  understand that the practice of medicine is not an exact science and that Practitioner makes no warranties or guarantees regarding treatment outcomes. I acknowledge that I will receive a copy of this consent concurrently upon execution via email to the email address I last provided but may also request a printed copy by calling the office of [facility].    I understand that my insurance will be billed for this visit.   I have read or had this consent read to me.  I understand the contents of this consent, which adequately explains the benefits and risks of the Services being provided via telemedicine.   I have been provided ample opportunity to ask questions regarding this consent and the Services and have had my questions answered to my satisfaction.  I give my informed consent for the services to be provided through the use of telemedicine in my medical care  By participating in this telemedicine visit I agree to the above.

## 2018-09-01 NOTE — Interval H&P Note (Signed)
Cath Lab Visit (complete for each Cath Lab visit)  Clinical Evaluation Leading to the Procedure:   ACS: No.  Non-ACS:    Anginal Classification: CCS III  Anti-ischemic medical therapy: Minimal Therapy (1 class of medications)  Non-Invasive Test Results: No non-invasive testing performed  Prior CABG: No previous CABG      History and Physical Interval Note:  09/01/2018 11:34 AM  [name]  has presented today for surgery, with the diagnosis of [place].  The various methods of treatment have been discussed with the patient and family. After consideration of risks, benefits and other options for treatment, the patient has consented to  Procedure(s): LEFT HEART CATH AND CORONARY ANGIOGRAPHY (N/A) as a surgical intervention.  The patient's history has been reviewed, patient examined, no change in status, stable for surgery.  I have reviewed the patient's chart and labs.  Questions were answered to the patient's satisfaction.     Shelva Majestic

## 2018-09-01 NOTE — Progress Notes (Signed)
   TELEPHONE CALL NOTE  This patient has been deemed a candidate for follow-up tele-health visit to limit community exposure during the Covid-19 pandemic. I spoke with the patient via phone to discuss instructions. This has been outlined on the patient's AVS (dotphrase: hcevisitinfo). The patient was advised to review the section on consent for treatment as well. The patient will receive a phone call 2-3 days prior to their E-Visit at which time consent will be verbally confirmed.   A Virtual Office Visit appointment type has been scheduled for 3 weeks with Dr. Domenic Polite, with "VIDEO" or "TELEPHONE" in the appointment notes - patient prefers Video type using his IPhone.  I have either confirmed the patient is active in MyChart or offered to send sign-up link to phone/email via Mychart icon beside patient's photo.  [place], [place] 09/01/2018 2:44 PM

## 2018-09-04 ENCOUNTER — Encounter (HOSPITAL_COMMUNITY): Payer: Self-pay | Admitting: Cardiovascular Disease

## 2018-09-04 ENCOUNTER — Encounter: Payer: Self-pay | Admitting: Family Medicine

## 2018-09-04 LAB — POCT ACTIVATED CLOTTING TIME: Activated Clotting Time: 307 seconds

## 2018-09-07 ENCOUNTER — Telehealth: Payer: Self-pay | Admitting: Cardiology

## 2018-09-07 NOTE — Telephone Encounter (Signed)
No, I would not consider diclofenac to be a standing medication.

## 2018-09-07 NOTE — Telephone Encounter (Signed)
Patient informed and understands to stop taking diclofenac.

## 2018-09-07 NOTE — Telephone Encounter (Signed)
Please give pt a call, has some questions about his medications since he had his cath.  [phone]

## 2018-09-07 NOTE — Telephone Encounter (Signed)
Patient called to see if he should continue taking diclofenac that his PCP gave him prior to his heart cath for a lung infection. Explained to patient that diclofenac is an NSAID for arthritis pain and that his instructions after his cath says to continue taking it. Verbalized understanding.

## 2018-09-11 ENCOUNTER — Telehealth: Payer: Self-pay | Admitting: Cardiology

## 2018-09-11 NOTE — Telephone Encounter (Signed)
See if he can come by the [facility] office for a nurse visit to get his radial pulse checked and make sure that the wound looks to be healing.  He may have some scar tissue or residual resolving degree of hematoma in the area that could be leading to the discomfort, but generally this should be improving.

## 2018-09-11 NOTE — Telephone Encounter (Signed)
R wrist has been aching every morning since d/c from [facility] 4/24 says he has tried Tylenol but this hasn't helped. Also says when he lays down at night has aching in his chest for the last week and had trouble sleeping, finally had a good nights rest last night and denies any aching in chest this morning. Denies SOB/dizziness. Wanted to know if the aching in his arm was something to be concerned about. Denies any redness/swelling in the wrist says the incision looks almost healed.

## 2018-09-11 NOTE — Telephone Encounter (Signed)
Pt scheduled for Wednesday 5/6 nurse appt

## 2018-09-11 NOTE — Telephone Encounter (Signed)
Patient states he had cath 4/24 and is still having ache in arm. Would like to speak with nurse. / tg

## 2018-09-13 ENCOUNTER — Other Ambulatory Visit: Payer: Self-pay

## 2018-09-13 ENCOUNTER — Ambulatory Visit (INDEPENDENT_AMBULATORY_CARE_PROVIDER_SITE_OTHER): Payer: Medicare Other | Admitting: *Deleted

## 2018-09-13 DIAGNOSIS — Z9889 Other specified postprocedural states: Secondary | ICD-10-CM | POA: Diagnosis not present

## 2018-09-13 NOTE — Progress Notes (Signed)
Patient notified and verbalized understanding. 

## 2018-09-13 NOTE — Progress Notes (Signed)
Patient in office for radial cath site check.  Area does appear to be bruised.  No streaking, knots, or warmness noted to that arm.  Good radial pulse, strong at 60 bpm.  Patient rating pain at 2/10 this morning, but yesterday would have rated 10/10.  Did take Tylenol this morning.  Stated that he does have some slight tingling in fingers and thumb area.  Thinks forearm looks a little swollen to him, but compared to left arm, seems the same.  He also questions if safe for him to go to [facility].  Will be back for his post cath visit scheduled on 09/29/2018.

## 2018-09-13 NOTE — Progress Notes (Signed)
Discussed with Ms. [name].  Ecchymosis and likely some degree of resolving hematoma but good pulse.  Warm compresses and use of Tylenol for now.  Symptoms should slowly improve, continue to observe.

## 2018-09-19 ENCOUNTER — Telehealth: Payer: Self-pay | Admitting: Cardiology

## 2018-09-19 NOTE — Telephone Encounter (Addendum)
Fwd to provider for answer.  Recent cath.

## 2018-09-19 NOTE — Telephone Encounter (Signed)
Would like to know if he can drink mixed drinks while on vacation

## 2018-09-19 NOTE — Telephone Encounter (Signed)
Pt.notified

## 2018-09-19 NOTE — Telephone Encounter (Signed)
As long as he drinks responsibly.

## 2018-09-28 ENCOUNTER — Telehealth: Payer: Self-pay | Admitting: *Deleted

## 2018-09-28 NOTE — Telephone Encounter (Signed)
Patient is scheduled for a telehealth encounter with me tomorrow.  He just recently had a heart catheterization that showed nonobstructive disease with the exception of a small nondominant RCA that was felt to be best managed medically.  Dr. Claiborne Billings mentioned a number of different medications in his cardiac catheterization report, but I do not know what he is actually tolerating as yet.  Please make sure that he has a good set of vital signs for his visit tomorrow so that we can talk about medication adjustments as needed.

## 2018-09-28 NOTE — Progress Notes (Signed)
This encounter was created in error - please disregard.

## 2018-09-28 NOTE — Telephone Encounter (Signed)
Called to confirm pt appt and pt c/o chest pain and SOB that started yesterday. Pt states that pain is worse when working on his wife's car. Pt took one Nitro while on the phone with nurse and felt relief. Instructed to be seen in the ED. Pt states he does not want to go at this time and that if it get worse he will seek treatment.

## 2018-09-28 NOTE — Telephone Encounter (Signed)
Current BP 145/66 HR 76 Pt weighs 143 lbs

## 2018-09-29 ENCOUNTER — Emergency Department (HOSPITAL_COMMUNITY): Payer: Medicare Other

## 2018-09-29 ENCOUNTER — Encounter (HOSPITAL_COMMUNITY): Payer: Self-pay | Admitting: Emergency Medicine

## 2018-09-29 ENCOUNTER — Observation Stay (HOSPITAL_COMMUNITY)
Admission: EM | Admit: 2018-09-29 | Discharge: 2018-09-30 | Disposition: A | Discharge status: Home / Self Care | Payer: Medicare Other | Attending: Family Medicine | Admitting: Family Medicine

## 2018-09-29 ENCOUNTER — Other Ambulatory Visit: Payer: Self-pay

## 2018-09-29 ENCOUNTER — Encounter: Payer: Medicare Other | Admitting: Cardiology

## 2018-09-29 VITALS — BP 145/66 | HR 76 | Wt 143.0 lb

## 2018-09-29 DIAGNOSIS — E039 Hypothyroidism, unspecified: Secondary | ICD-10-CM | POA: Diagnosis not present

## 2018-09-29 DIAGNOSIS — J449 Chronic obstructive pulmonary disease, unspecified: Secondary | ICD-10-CM | POA: Diagnosis not present

## 2018-09-29 DIAGNOSIS — Z79899 Other long term (current) drug therapy: Secondary | ICD-10-CM | POA: Insufficient documentation

## 2018-09-29 DIAGNOSIS — Z8546 Personal history of malignant neoplasm of prostate: Secondary | ICD-10-CM | POA: Insufficient documentation

## 2018-09-29 DIAGNOSIS — Z7982 Long term (current) use of aspirin: Secondary | ICD-10-CM | POA: Diagnosis not present

## 2018-09-29 DIAGNOSIS — Z20828 Contact with and (suspected) exposure to other viral communicable diseases: Secondary | ICD-10-CM | POA: Insufficient documentation

## 2018-09-29 DIAGNOSIS — R071 Chest pain on breathing: Secondary | ICD-10-CM

## 2018-09-29 DIAGNOSIS — Z87891 Personal history of nicotine dependence: Secondary | ICD-10-CM | POA: Insufficient documentation

## 2018-09-29 DIAGNOSIS — R079 Chest pain, unspecified: Principal | ICD-10-CM | POA: Diagnosis present

## 2018-09-29 DIAGNOSIS — I2510 Atherosclerotic heart disease of native coronary artery without angina pectoris: Secondary | ICD-10-CM | POA: Diagnosis not present

## 2018-09-29 DIAGNOSIS — Z9049 Acquired absence of other specified parts of digestive tract: Secondary | ICD-10-CM | POA: Insufficient documentation

## 2018-09-29 DIAGNOSIS — E871 Hypo-osmolality and hyponatremia: Secondary | ICD-10-CM | POA: Insufficient documentation

## 2018-09-29 DIAGNOSIS — J439 Emphysema, unspecified: Secondary | ICD-10-CM | POA: Diagnosis not present

## 2018-09-29 DIAGNOSIS — I10 Essential (primary) hypertension: Secondary | ICD-10-CM | POA: Diagnosis not present

## 2018-09-29 LAB — CBC
HCT: 45.2 % (ref 39.0–52.0)
Hemoglobin: 15.4 g/dL (ref 13.0–17.0)
MCH: 30.3 pg (ref 26.0–34.0)
MCHC: 34.1 g/dL (ref 30.0–36.0)
MCV: 88.8 fL (ref 80.0–100.0)
Platelets: 215 10*3/uL (ref 150–400)
RBC: 5.09 MIL/uL (ref 4.22–5.81)
RDW: 11.9 % (ref 11.5–15.5)
WBC: 10.4 10*3/uL (ref 4.0–10.5)
nRBC: 0 % (ref 0.0–0.2)

## 2018-09-29 LAB — BASIC METABOLIC PANEL
Anion gap: 12 (ref 5–15)
BUN: 12 mg/dL (ref 8–23)
CO2: 24 mmol/L (ref 22–32)
Calcium: 9.1 mg/dL (ref 8.9–10.3)
Chloride: 92 mmol/L — ABNORMAL LOW (ref 98–111)
Creatinine, Ser: 0.92 mg/dL (ref 0.61–1.24)
GFR calc Af Amer: >60 mL/min (ref >60)
GFR calc non Af Amer: >60 mL/min (ref >60)
Glucose, Bld: 115 mg/dL — ABNORMAL HIGH (ref 70–99)
Potassium: 4.4 mmol/L (ref 3.5–5.1)
Sodium: 128 mmol/L — ABNORMAL LOW (ref 135–145)

## 2018-09-29 LAB — SARS CORONAVIRUS 2 BY RT PCR (HOSPITAL ORDER, PERFORMED IN ~~LOC~~ HOSPITAL LAB): SARS Coronavirus 2: NEGATIVE

## 2018-09-29 LAB — SEDIMENTATION RATE: Sed Rate: 53 mm/hr — ABNORMAL HIGH (ref 0–16)

## 2018-09-29 LAB — TROPONIN I
Troponin I: <0.03 ng/mL (ref <0.03)
Troponin I: <0.03 ng/mL (ref <0.03)
Troponin I: <0.03 ng/mL (ref <0.03)

## 2018-09-29 LAB — D-DIMER, QUANTITATIVE: D-Dimer, Quant: 0.32 ug/mL-FEU (ref 0.00–0.50)

## 2018-09-29 MED ORDER — ENOXAPARIN SODIUM 40 MG/0.4ML ~~LOC~~ SOLN
40.0000 mg | SUBCUTANEOUS | Status: DC
  Administered 2018-09-29: 17:00:00 40 mg via SUBCUTANEOUS
  Filled 2018-09-29: qty 0.4

## 2018-09-29 MED ORDER — ONDANSETRON HCL 4 MG/2ML IJ SOLN: 4.0000 mg | Freq: Four times a day (QID) | INTRAMUSCULAR | Status: DC | PRN

## 2018-09-29 MED ORDER — SODIUM CHLORIDE 0.9% FLUSH: 3.0000 mL | Freq: Once | INTRAVENOUS | Status: DC

## 2018-09-29 MED ORDER — METOPROLOL TARTRATE 25 MG PO TABS
12.5000 mg | ORAL_TABLET | Freq: Two times a day (BID) | ORAL | Status: DC
  Administered 2018-09-29 – 2018-09-30 (×2): 12.5 mg via ORAL
  Filled 2018-09-29 (×2): qty 1

## 2018-09-29 MED ORDER — LEVOTHYROXINE SODIUM 88 MCG PO TABS: 88.0000 ug | ORAL_TABLET | ORAL | Status: DC

## 2018-09-29 MED ORDER — EZETIMIBE 10 MG PO TABS
10.0000 mg | ORAL_TABLET | Freq: Every day | ORAL | Status: DC
  Administered 2018-09-29 – 2018-09-30 (×2): 10 mg via ORAL
  Filled 2018-09-29 (×2): qty 1

## 2018-09-29 MED ORDER — ONDANSETRON HCL 4 MG PO TABS: 4.0000 mg | ORAL_TABLET | Freq: Four times a day (QID) | ORAL | Status: DC | PRN

## 2018-09-29 MED ORDER — PANTOPRAZOLE SODIUM 40 MG PO TBEC
40.0000 mg | DELAYED_RELEASE_TABLET | Freq: Every day | ORAL | Status: DC
  Administered 2018-09-30: 09:00:00 40 mg via ORAL
  Filled 2018-09-29: qty 1

## 2018-09-29 MED ORDER — ACETAMINOPHEN 325 MG PO TABS: 650.0000 mg | ORAL_TABLET | Freq: Four times a day (QID) | ORAL | Status: DC | PRN

## 2018-09-29 MED ORDER — ASPIRIN EC 81 MG PO TBEC
81.0000 mg | DELAYED_RELEASE_TABLET | Freq: Every day | ORAL | Status: DC
  Administered 2018-09-29 – 2018-09-30 (×2): 81 mg via ORAL
  Filled 2018-09-29 (×2): qty 1

## 2018-09-29 MED ORDER — ACETAMINOPHEN 650 MG RE SUPP: 650.0000 mg | Freq: Four times a day (QID) | RECTAL | Status: DC | PRN

## 2018-09-29 MED ORDER — DESMOPRESSIN ACETATE 0.2 MG PO TABS
0.1000 mg | ORAL_TABLET | Freq: Four times a day (QID) | ORAL | Status: DC | PRN
  Filled 2018-09-29: qty 1

## 2018-09-29 MED ORDER — ISOSORBIDE MONONITRATE ER 60 MG PO TB24
30.0000 mg | ORAL_TABLET | Freq: Every day | ORAL | Status: DC
  Administered 2018-09-29 – 2018-09-30 (×2): 30 mg via ORAL
  Filled 2018-09-29 (×2): qty 1

## 2018-09-29 MED ORDER — ASPIRIN 81 MG PO CHEW
324.0000 mg | CHEWABLE_TABLET | Freq: Once | ORAL | Status: AC
  Administered 2018-09-29: 324 mg via ORAL
  Filled 2018-09-29: qty 4

## 2018-09-29 NOTE — H&P (Signed)
History and Physical  [name] OIB:[id number] DOB: [birth year] DOA: 09/29/2018   PCP: [name], MD   Patient coming from: Home  Chief Complaint: chest pain  HPI:  [name] is a 78 y.o. male with medical history of coronary artery disease, hypertension, pituitary macroadenoma, and prostate cancer presenting with chest pain that started on 09/27/2018.  He states that he has chest discomfort at rest as well as with some activity.  He states that he has been doing some yard work as well as changing some tires on his wife's car without any worsening chest discomfort.  However he notes that occasionally when he takes his trash out to the sidewalk he does get short of breath and has intermittent chest discomfort.  He states that his chest discomfort has been constant since the evening of 09/27/2018.  On the morning of 09/29/2018, the patient felt short of breath and lightheaded with his continued central chest discomfort.  As result, he presented for further evaluation.  He denied any syncope.  He denied any palpitation.  The patient had recent heart catheterization on 09/01/2018 which showed an 80% stenosis in the nondominant RCA of 80%.  The patient was treated medically.  EF was 50 to 55%.  The patient was started on metoprolol, Zetia, and Imdur. In the emergency department, the patient was afebrile hemodynamically stable saturating 96% on room air.  BMP, CBC, and EKG were unremarkable.  D-dimer was negative.  CXR showed emphysema. Cardiology was consulted to assist with management.  Assessment/Plan: Chest pain -He has typical and atypical components -Appreciate cardiology consult--> develops angina -Continue aspirin -Continue metoprolol tartrate -Continue Imdur -08/28/2018 CT angiogram chest negative for PE -D-dimer negative on 09/29/2018 -Finish cycling troponins  COPD -Patient does not have any formal PFTs -Suspect this may be partly contributing to the patient's dyspnea -He  will need formal PFTs after discharge -No wheezing or signs of exacerbation presently  Hyponatremia -Secondary to DDAVP -This has been chronic -Monitor serially  Hypothyroidism -Continue Synthroid -Check TSH       Past Medical History:  Diagnosis Date  . Arthritis   . Colon polyps    Adenomatous  . Diverticulosis   . Essential hypertension   . GERD (gastroesophageal reflux disease)   . Hypercholesterolemia   . Pituitary microadenoma ([place])    Status post resection 2009  . Prostate cancer [facility])    Past Surgical History:  Procedure Laterality Date  . BACK SURGERY  1991  . CHOLECYSTECTOMY  08/2007  . LEFT HEART CATH AND CORONARY ANGIOGRAPHY N/A 09/01/2018   Procedure: LEFT HEART CATH AND CORONARY ANGIOGRAPHY;  Surgeon: Troy Sine, MD;  Location: [place] [facility];  Service: Cardiovascular;  Laterality: N/A;  . PROSTATE SURGERY    . Removal of pituitary tumor  9/09   Social History:  reports that he has quit smoking. His smoking use included cigarettes. He quit smokeless tobacco use about 40 years ago. He reports current alcohol use. He reports previous drug use.   Family History  Problem Relation Age of Onset  . Clotting disorder Father   . Heart disease Father   . Heart attack Father   . Colon cancer Neg Hx   . Diabetes Neg Hx      Allergies  Allergen Reactions  . Codeine   . Losartan     Patient had elevated potassium and also slight elevation of creatinine-October 2017  . Morphine   . Amlodipine  Complained of neck pain with medication-November 2017  . Statins Other (See Comments)    Body aches intolerant to statins  . Tramadol     Sweat, light-headed     Prior to Admission medications   Medication Sig Start Date End Date Taking? Authorizing Provider  aspirin EC 81 MG tablet Take 81 mg by mouth daily.    [provider]  desmopressin (DDAVP) 0.1 MG tablet Take 0.1 mg by mouth 4 (four) times daily as needed (Pt takes 3 to 4 times  daily).     [provider]  ezetimibe (ZETIA) 10 MG tablet Take 1 tablet (10 mg total) by mouth daily. 09/01/18   Kroeger, Lorelee Cover., PA-C  isosorbide mononitrate (IMDUR) 30 MG 24 hr tablet Take 1 tablet (30 mg total) by mouth daily. 09/01/18   Kroeger, Lorelee Cover., PA-C  levothyroxine (SYNTHROID, LEVOTHROID) 88 MCG tablet Take 88 mcg by mouth every other day.    [provider]  metoprolol tartrate (LOPRESSOR) 25 MG tablet Take 0.5 tablets (12.5 mg total) by mouth 2 (two) times daily. 09/01/18   Kroeger, Lorelee Cover., PA-C  nitroGLYCERIN (NITROSTAT) 0.4 MG SL tablet Place 1 tablet (0.4 mg total) under the tongue every 5 (five) minutes as needed for chest pain (Call 911 if still having chest pain after taking 3rd tablet). 08/30/18   [name], MD  omeprazole (PRILOSEC) 20 MG capsule TAKE 1 CAPSULE BY MOUTH EVERY DAY 07/03/18   [name], MD    Review of Systems:  Constitutional:  No weight loss, night sweats, Fevers, chills, fatigue.  Head&Eyes: No headache.  No vision loss.  No eye pain or scotoma ENT:  No Difficulty swallowing,Tooth/dental problems,Sore throat,  No ear ache, post nasal drip,  Cardio-vascular:  No Orthopnea, PND, swelling in lower extremities,  dizziness, palpitations  GI:  No  abdominal pain, nausea, vomiting, diarrhea, loss of appetite, hematochezia, melena, heartburn, indigestion, Resp:  No shortness of breath with exertion or at rest. No cough. No coughing up of blood .No wheezing.No chest wall deformity  Skin:  no rash or lesions.  GU:  no dysuria, change in color of urine, no urgency or frequency. No flank pain.  Musculoskeletal:  No joint pain or swelling. No decreased range of motion. No back pain.  Psych:  No change in mood or affect. No depression or anxiety. Neurologic: No headache, no dysesthesia, no focal weakness, no vision loss. No syncope  Physical Exam: Vitals:   09/29/18 0945 09/29/18 1000 09/29/18 1015 09/29/18 1030  BP:   (!) 99/58  (!) 88/61  Pulse:  64 66 63  Resp: 19 18 17 17   Temp:      TempSrc:      SpO2:  91% 96% 96%  Weight:       General:  A&O x 3, NAD, nontoxic, pleasant/cooperative Head/Eye: No conjunctival hemorrhage, no icterus, [place]/AT, No nystagmus ENT:  No icterus,  No thrush, good dentition, no pharyngeal exudate Neck:  No masses, no lymphadenpathy, no bruits CV:  RRR, no rub, no gallop, no S3 Lung:  Diminished breath sounds. No wheezing. Abdomen: soft/NT, +BS, nondistended, no peritoneal signs Ext: No cyanosis, No rashes, No petechiae, No lymphangitis, No edema Neuro: CNII-XII intact, strength 4/5 in bilateral upper and lower extremities, no dysmetria  Labs on Admission:  Basic Metabolic Panel: Recent Labs  Lab 09/29/18 0824  NA 128*  K 4.4  CL 92*  CO2 24  GLUCOSE 115*  BUN 12  CREATININE 0.92  CALCIUM 9.1   Liver Function Tests: No results for input(s): AST, ALT, ALKPHOS, BILITOT, PROT, ALBUMIN in the last 168 hours. No results for input(s): LIPASE, AMYLASE in the last 168 hours. No results for input(s): AMMONIA in the last 168 hours. CBC: Recent Labs  Lab 09/29/18 0824  WBC 10.4  HGB 15.4  HCT 45.2  MCV 88.8  PLT 215   Coagulation Profile: No results for input(s): INR, PROTIME in the last 168 hours. Cardiac Enzymes: Recent Labs  Lab 09/29/18 0824  TROPONINI <0.03   BNP: Invalid input(s): POCBNP CBG: No results for input(s): GLUCAP in the last 168 hours. Urine analysis:    Component Value Date/Time   COLORURINE STRAW (A) 03/10/2008 1918   APPEARANCEUR CLEAR 03/10/2008 1918   LABSPEC <1.005 (L) 03/10/2008 1918   PHURINE 6.0 03/10/2008 1918   GLUCOSEU NEGATIVE 03/10/2008 1918   HGBUR NEGATIVE 03/10/2008 1918   BILIRUBINUR ++ 09/04/2015 1452   KETONESUR NEGATIVE 03/10/2008 1918   PROTEINUR NEGATIVE 03/10/2008 1918   UROBILINOGEN 4.0 09/04/2015 1452   UROBILINOGEN 0.2 03/10/2008 1918   NITRITE NEGATIVE 03/10/2008 1918   LEUKOCYTESUR  03/10/2008 1918     NEGATIVE MICROSCOPIC NOT DONE ON URINES WITH NEGATIVE PROTEIN, BLOOD, LEUKOCYTES, NITRITE, OR GLUCOSE <1000 mg/dL.   Sepsis Labs: @LABRCNTIP (procalcitonin:4,lacticidven:4) ) Recent Results (from the past 240 hour(s))  SARS Coronavirus 2 (CEPHEID - Performed in [place] hospital lab), Hosp Order     Status: None   Collection Time: 09/29/18  9:29 AM  Result Value Ref Range Status   SARS Coronavirus 2 NEGATIVE NEGATIVE Final    Comment: (NOTE) If result is NEGATIVE SARS-CoV-2 target nucleic acids are NOT DETECTED. The SARS-CoV-2 RNA is generally detectable in upper and lower  respiratory specimens during the acute phase of infection. The lowest  concentration of SARS-CoV-2 viral copies this assay can detect is 250  copies / mL. A negative result does not preclude SARS-CoV-2 infection  and should not be used as the sole basis for treatment or other  patient management decisions.  A negative result may occur with  improper specimen collection / handling, submission of specimen other  than nasopharyngeal swab, presence of viral mutation(s) within the  areas targeted by this assay, and inadequate number of viral copies  (<250 copies / mL). A negative result must be combined with clinical  observations, patient history, and epidemiological information. If result is POSITIVE SARS-CoV-2 target nucleic acids are DETECTED. The SARS-CoV-2 RNA is generally detectable in upper and lower  respiratory specimens dur ing the acute phase of infection.  Positive  results are indicative of active infection with SARS-CoV-2.  Clinical  correlation with patient history and other diagnostic information is  necessary to determine patient infection status.  Positive results do  not rule out bacterial infection or co-infection with other viruses. If result is PRESUMPTIVE POSTIVE SARS-CoV-2 nucleic acids MAY BE PRESENT.   A presumptive positive result was obtained on the submitted specimen  and  confirmed on repeat testing.  While 2019 novel coronavirus  (SARS-CoV-2) nucleic acids may be present in the submitted sample  additional confirmatory testing may be necessary for epidemiological  and / or clinical management purposes  to differentiate between  SARS-CoV-2 and other Sarbecovirus currently known to infect humans.  If clinically indicated additional testing with an alternate test  methodology [phone]) is advised. The SARS-CoV-2 RNA is generally  detectable in upper and lower respiratory sp ecimens during the acute  phase of infection. The expected result is  Negative. Fact Sheet for Patients:  StrictlyIdeas.no Fact Sheet for Healthcare Providers: BankingDealers.co.za This test is not yet approved or cleared by the [place] FDA and has been authorized for detection and/or diagnosis of SARS-CoV-2 by FDA under an Emergency Use Authorization (EUA).  This EUA will remain in effect (meaning this test can be used) for the duration of the COVID-19 declaration under Section 564(b)(1) of the Act, 21 U.S.C. section 360bbb-3(b)(1), unless the authorization is terminated or revoked sooner. Performed at [facility], [address], [place], [place] [address]      Radiological Exams on Admission: Dg Chest 2 View  Result Date: 09/29/2018 CLINICAL DATA:  78 year old male with a history of chest pain EXAM: CHEST - 2 VIEW COMPARISON:  08/28/2018, 07/26/2016 FINDINGS: Cardiomediastinal silhouette unchanged in size and contour. Stigmata of emphysema, with increased retrosternal airspace, flattened hemidiaphragms, increased AP diameter, and hyperinflation on the AP view. Aortic calcifications. No pneumothorax pleural effusion or confluent airspace disease. No acute displaced fracture. Degenerative changes of the thoracic spine. IMPRESSION: Emphysema and chronic changes without evidence of superimposed acute cardiopulmonary disease. Aortic  atherosclerosis Electronically Signed   By: Corrie Mckusick D.O.   On: 09/29/2018 09:11    EKG: Independently reviewed. Sinus, LVH changes    Time spent:60 minutes Code Status:   FULL Family Communication:  No Family at bedside Disposition Plan: expect 1 day hospitalization Consults called: cardiology DVT Prophylaxis: [place] Lovenox  Orson Eva, DO  [facility] Pager [phone]  If 7PM-7AM, please contact night-coverage [email or web address] Password [email or web address] 09/29/2018, 12:00 PM

## 2018-09-29 NOTE — Consult Note (Signed)
CARDIOLOGY CONSULT NOTE       Patient ID: [name] MRN: [id number] DOB/AGE: May 29, 1940 78 y.o.  Admit date: 09/29/2018 Referring Physician: [name] Primary Physician: [name], MD Primary Cardiologist: Domenic Polite Reason for Consultation: Chest pain   Active Problems:   * No active hospital problems. *   HPI:  78 y.o. former smoker History of SSCP Cath done 09/01/18 with no critical disease Did have what Dr Claiborne Billings called 80% proximal lesion in small non dominant RCA I reviewed films and felt he had a smooth 60% lesion and the RCA only supplied a small RV branch and would not be one that would be intervened on. 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 Changed the tires / rotated on his car He has pleuritic chest pain worse with deep breath. While I was in room his sats were as low as 88%. No fever, cough quit smoking decades ago Had CTA February with no PE and d dimer negative this visit. Pain was intense and felt pre syncopal with it x 1. Currently comfortable. Telemetry with NSR ECG with no acute changes and troponin negative CXR now shows emphysema with chronic changes no acute findings   ROS All other systems reviewed and negative except as noted above  Past Medical History:  Diagnosis Date  . Arthritis   . Colon polyps    Adenomatous  . Diverticulosis   . Essential hypertension   . GERD (gastroesophageal reflux disease)   . Hypercholesterolemia   . Pituitary microadenoma ([place])    Status post resection 2009  . Prostate cancer [facility])     Family History  Problem Relation Age of Onset  . Clotting disorder Father   . Heart disease Father   . Heart attack Father   . Colon cancer Neg Hx   . Diabetes Neg Hx     Social History   Socioeconomic History  . Marital status: Married    Spouse name: Not on file  . Number of children: 2  . Years of education: Not on file  . Highest education level: Not on file  Occupational History  . Occupation:  Retired  Scientific laboratory technician  . Financial resource strain: Not on file  . Food insecurity:    Worry: Not on file    Inability: Not on file  . Transportation needs:    Medical: Not on file    Non-medical: Not on file  Tobacco Use  . Smoking status: Former Smoker    Types: Cigarettes  . Smokeless tobacco: Former Systems developer    Quit date: 11/03/1977  Substance and Sexual Activity  . Alcohol use: Yes    Alcohol/week: 0.0 standard drinks    Comment: one drink a day per pt  . Drug use: Not Currently  . Sexual activity: Not on file  Lifestyle  . Physical activity:    Days per week: Not on file    Minutes per session: Not on file  . Stress: Not on file  Relationships  . Social connections:    Talks on phone: Not on file    Gets together: Not on file    Attends religious service: Not on file    Active member of club or organization: Not on file    Attends meetings of clubs or organizations: Not on file    Relationship status: Not on file  . Intimate partner violence:    Fear of current or ex partner: Not on file  Emotionally abused: Not on file    Physically abused: Not on file    Forced sexual activity: Not on file  Other Topics Concern  . Not on file  Social History Narrative   Married with 2 children   Right handed   12+   2-3 cups daily    Past Surgical History:  Procedure Laterality Date  . BACK SURGERY  1991  . CHOLECYSTECTOMY  08/2007  . LEFT HEART CATH AND CORONARY ANGIOGRAPHY N/A 09/01/2018   Procedure: LEFT HEART CATH AND CORONARY ANGIOGRAPHY;  Surgeon: Troy Sine, MD;  Location: [place] [facility];  Service: Cardiovascular;  Laterality: N/A;  . PROSTATE SURGERY    . Removal of pituitary tumor  9/09     . sodium chloride flush  3 mL Intravenous Once     Physical Exam: Blood pressure (!) 88/61, pulse 63, temperature 98.1 F (36.7 C), temperature source Oral, resp. rate 17, weight 64 kg, SpO2 96 %.   Affect appropriate Healthy:  appears stated age 40: normal  Neck supple with no adenopathy JVP normal no bruits no thyromegaly Lungs inspiratory crackles right greater than left  Heart:  S1/S2 no murmur, no rub, gallop or click PMI normal Abdomen: benighn, BS positve, no tenderness, no AAA no bruit.  No HSM or HJR Distal pulses intact with no bruits No edema Neuro non-focal Skin warm and dry No muscular weakness   Labs:   Lab Results  Component Value Date   WBC 10.4 09/29/2018   HGB 15.4 09/29/2018   HCT 45.2 09/29/2018   MCV 88.8 09/29/2018   PLT 215 09/29/2018    Recent Labs  Lab 09/29/18 0824  NA 128*  K 4.4  CL 92*  CO2 24  BUN 12  CREATININE 0.92  CALCIUM 9.1  GLUCOSE 115*   Lab Results  Component Value Date   CKTOTAL 126 09/25/2013   CKMB 1.5 03/11/2008   TROPONINI <0.03 09/29/2018    Lab Results  Component Value Date   CHOL 207 (H) 03/17/2018   CHOL 204 (H) 09/08/2017   CHOL 234 (H) 08/31/2016   Lab Results  Component Value Date   HDL 48 03/17/2018   HDL 48 09/08/2017   HDL 52 08/31/2016   Lab Results  Component Value Date   LDLCALC 136 (H) 03/17/2018   LDLCALC 128 (H) 09/08/2017   LDLCALC 152 (H) 08/31/2016   Lab Results  Component Value Date   TRIG 117 03/17/2018   TRIG 140 09/08/2017   TRIG 148 08/31/2016   Lab Results  Component Value Date   CHOLHDL 4.3 03/17/2018   CHOLHDL 4.3 09/08/2017   CHOLHDL 4.5 08/31/2016   No results found for: LDLDIRECT    Radiology: Dg Chest 2 View  Result Date: 09/29/2018 CLINICAL DATA:  78 year old male with a history of chest pain EXAM: CHEST - 2 VIEW COMPARISON:  08/28/2018, 07/26/2016 FINDINGS: Cardiomediastinal silhouette unchanged in size and contour. Stigmata of emphysema, with increased retrosternal airspace, flattened hemidiaphragms, increased AP diameter, and hyperinflation on the AP view. Aortic calcifications. No pneumothorax pleural effusion or confluent airspace disease. No acute displaced fracture. Degenerative changes of the thoracic spine.  IMPRESSION: Emphysema and chronic changes without evidence of superimposed acute cardiopulmonary disease. Aortic atherosclerosis Electronically Signed   By: Corrie Mckusick D.O.   On: 09/29/2018 09:11    EKG:  NSR normal no acute changes    ASSESSMENT AND PLAN:   1. Chest Pain: he has pleuritic pain doubt angina. ECG normal r/o  and recent cath with moderate non dominant right disease that would be Rx medically . Continue beta blocker and nitrates no indication for repeat cath 2. Pulmonary: he has an abnormal lung exam with hypoxemia on pulse oximetry D dimer negative and CTA February no PE. Concern for ? ILD inflammatory process Check high resolution thin cut CT and ESR. Needs outpatient PFTls and DLCO Can Rx with tramadol/ NSAI's for pleuritic pain  3. Thyroid :  On replacement would update TSH in am normal 09/2017 4. HLD:  Intolerant to statins on zetia   Signed: Jenkins Rouge 09/29/2018, 11:22 AM

## 2018-09-29 NOTE — ED Notes (Signed)
ED TO INPATIENT HANDOFF REPORT  ED Nurse Name and Phone #: [phone]  S Name/Age/Gender [name] 78 y.o. male Room/Bed: APA06/APA06  Code Status   Code Status: Prior  Home/SNF/Other Home Patient oriented to: self Is this baseline? Yes   Triage Complete: Triage complete  Chief Complaint CHEST PAIN  Triage Note Patient complains of chest pain in center that started yesterday afternoon. States sweats and feeling like he was going to pass out this morning around 0700. Denies n/v.   Allergies Allergies  Allergen Reactions  . Codeine   . Losartan     Patient had elevated potassium and also slight elevation of creatinine-October 2017  . Morphine   . Amlodipine     Complained of neck pain with medication-November 2017  . Statins Other (See Comments)    Body aches intolerant to statins  . Tramadol     Sweat, light-headed    Level of Care/Admitting Diagnosis ED Disposition    ED Disposition Condition [facility] Area: [facility] [[id number]]  Level of Care: Telemetry [5]  Covid Evaluation: N/A  Diagnosis: Chest pain [735329]  Admitting Physician: TAT, DAVID [[id number]]  Attending Physician: TAT, DAVID [[id number]]  PT Class (Do Not Modify): Observation [104]  PT Acc Code (Do Not Modify): Observation [10022]       B Medical/Surgery History Past Medical History:  Diagnosis Date  . Arthritis   . Colon polyps    Adenomatous  . Diverticulosis   . Essential hypertension   . GERD (gastroesophageal reflux disease)   . Hypercholesterolemia   . Pituitary microadenoma ([place])    Status post resection 2009  . Prostate cancer [facility])    Past Surgical History:  Procedure Laterality Date  . BACK SURGERY  1991  . CHOLECYSTECTOMY  08/2007  . LEFT HEART CATH AND CORONARY ANGIOGRAPHY N/A 09/01/2018   Procedure: LEFT HEART CATH AND CORONARY ANGIOGRAPHY;  Surgeon: Troy Sine, MD;  Location: [place] [facility];  Service: Cardiovascular;  Laterality: N/A;  .  PROSTATE SURGERY    . Removal of pituitary tumor  9/09     A IV Location/Drains/Wounds Patient Lines/Drains/Airways Status   Active Line/Drains/Airways    Name:   Placement date:   Placement time:   Site:   Days:   Peripheral IV 09/29/18 Left Forearm   09/29/18    0828    Forearm   less than 1          Intake/Output Last 24 hours No intake or output data in the 24 hours ending 09/29/18 1430  Labs/Imaging Results for orders placed or performed during the hospital encounter of 09/29/18 (from the past 48 hour(s))  Basic metabolic panel     Status: Abnormal   Collection Time: 09/29/18  8:24 AM  Result Value Ref Range   Sodium 128 (L) 135 - 145 mmol/L   Potassium 4.4 3.5 - 5.1 mmol/L   Chloride 92 (L) 98 - 111 mmol/L   CO2 24 22 - 32 mmol/L   Glucose, Bld 115 (H) 70 - 99 mg/dL   BUN 12 8 - 23 mg/dL   Creatinine, Ser 0.92 0.61 - 1.24 mg/dL   Calcium 9.1 8.9 - 10.3 mg/dL   GFR calc non Af Amer >60 >60 mL/min   GFR calc Af Amer >60 >60 mL/min   Anion gap 12 5 - 15    Comment: Performed at [facility], [address], [place], [place] [address]  CBC  Status: None   Collection Time: 09/29/18  8:24 AM  Result Value Ref Range   WBC 10.4 4.0 - 10.5 K/uL   RBC 5.09 4.22 - 5.81 MIL/uL   Hemoglobin 15.4 13.0 - 17.0 g/dL   HCT 45.2 39.0 - 52.0 %   MCV 88.8 80.0 - 100.0 fL   MCH 30.3 26.0 - 34.0 pg   MCHC 34.1 30.0 - 36.0 g/dL   RDW 11.9 11.5 - 15.5 %   Platelets 215 150 - 400 K/uL   nRBC 0.0 0.0 - 0.2 %    Comment: Performed at [facility], [address], [place], [place] [address]  Troponin I - ONCE - STAT     Status: None   Collection Time: 09/29/18  8:24 AM  Result Value Ref Range   Troponin I <0.03 <0.03 ng/mL    Comment: Performed at [facility], [address] [place], [place] [address]  SARS Coronavirus 2 (CEPHEID - Performed in [place] hospital lab), Hosp Order     Status: None   Collection Time: 09/29/18  9:29 AM  Result Value Ref Range   SARS Coronavirus 2  NEGATIVE NEGATIVE    Comment: (NOTE) If result is NEGATIVE SARS-CoV-2 target nucleic acids are NOT DETECTED. The SARS-CoV-2 RNA is generally detectable in upper and lower  respiratory specimens during the acute phase of infection. The lowest  concentration of SARS-CoV-2 viral copies this assay can detect is 250  copies / mL. A negative result does not preclude SARS-CoV-2 infection  and should not be used as the sole basis for treatment or other  patient management decisions.  A negative result may occur with  improper specimen collection / handling, submission of specimen other  than nasopharyngeal swab, presence of viral mutation(s) within the  areas targeted by this assay, and inadequate number of viral copies  (<250 copies / mL). A negative result must be combined with clinical  observations, patient history, and epidemiological information. If result is POSITIVE SARS-CoV-2 target nucleic acids are DETECTED. The SARS-CoV-2 RNA is generally detectable in upper and lower  respiratory specimens dur ing the acute phase of infection.  Positive  results are indicative of active infection with SARS-CoV-2.  Clinical  correlation with patient history and other diagnostic information is  necessary to determine patient infection status.  Positive results do  not rule out bacterial infection or co-infection with other viruses. If result is PRESUMPTIVE POSTIVE SARS-CoV-2 nucleic acids MAY BE PRESENT.   A presumptive positive result was obtained on the submitted specimen  and confirmed on repeat testing.  While 2019 novel coronavirus  (SARS-CoV-2) nucleic acids may be present in the submitted sample  additional confirmatory testing may be necessary for epidemiological  and / or clinical management purposes  to differentiate between  SARS-CoV-2 and other Sarbecovirus currently known to infect humans.  If clinically indicated additional testing with an alternate test  methodology [phone]) is  advised. The SARS-CoV-2 RNA is generally  detectable in upper and lower respiratory sp ecimens during the acute  phase of infection. The expected result is Negative. Fact Sheet for Patients:  StrictlyIdeas.no Fact Sheet for Healthcare Providers: BankingDealers.co.za This test is not yet approved or cleared by the [place] FDA and has been authorized for detection and/or diagnosis of SARS-CoV-2 by FDA under an Emergency Use Authorization (EUA).  This EUA will remain in effect (meaning this test can be used) for the duration of the COVID-19 declaration under Section 564(b)(1) of the Act, 21 U.S.C.  section 360bbb-3(b)(1), unless the authorization is terminated or revoked sooner. Performed at [facility], [address] [place], [place] [address]   D-dimer, quantitative (not at [facility])     Status: None   Collection Time: 09/29/18  9:34 AM  Result Value Ref Range   D-Dimer, Quant 0.32 0.00 - 0.50 ug/mL-FEU    Comment: (NOTE) At the manufacturer cut-off of 0.50 ug/mL FEU, this assay has been documented to exclude PE with a sensitivity and negative predictive value of 97 to 99%.  At this time, this assay has not been approved by the FDA to exclude DVT/VTE. Results should be correlated with clinical presentation. Performed at [facility], [address] [place], [place] [address]   Sedimentation rate     Status: Abnormal   Collection Time: 09/29/18 11:46 AM  Result Value Ref Range   Sed Rate 53 (H) 0 - 16 mm/hr    Comment: Performed at [facility], [address] [place], [place] [address]   Dg Chest 2 View  Result Date: 09/29/2018 CLINICAL DATA:  78 year old male with a history of chest pain EXAM: CHEST - 2 VIEW COMPARISON:  08/28/2018, 07/26/2016 FINDINGS: Cardiomediastinal silhouette unchanged in size and contour. Stigmata of emphysema, with increased retrosternal airspace, flattened hemidiaphragms, increased AP diameter, and  hyperinflation on the AP view. Aortic calcifications. No pneumothorax pleural effusion or confluent airspace disease. No acute displaced fracture. Degenerative changes of the thoracic spine. IMPRESSION: Emphysema and chronic changes without evidence of superimposed acute cardiopulmonary disease. Aortic atherosclerosis Electronically Signed   By: Corrie Mckusick D.O.   On: 09/29/2018 09:11   Ct Chest High Resolution  Result Date: 09/29/2018 CLINICAL DATA:  Chest pain, hypoxemia, possible ILD EXAM: CT CHEST WITHOUT CONTRAST TECHNIQUE: Multidetector CT imaging of the chest was performed following the standard protocol without intravenous contrast. High resolution imaging of the lungs, as well as inspiratory and expiratory imaging, was performed. COMPARISON:  CT chest, 08/28/2018, 10/16/2013 FINDINGS: Cardiovascular: Aortic atherosclerosis. Scattered coronary artery calcifications. Pericardial thickening and trace pericardial effusion, somewhat increased compared to prior examination. Subtle calcifications of the posterior/left ventricular pericardium. Mediastinum/Nodes: No enlarged mediastinal, hilar, or axillary lymph nodes. Thyroid gland, trachea, and esophagus demonstrate no significant findings. Lungs/Pleura: Moderate centrilobular emphysema. Mild bibasilar scarring or atelectasis. No significant air trapping on expiratory phase and no pleural effusion or pneumothorax. Upper Abdomen: No acute abnormality. Musculoskeletal: No chest wall mass or suspicious bone lesions identified. IMPRESSION: 1. Moderate emphysema. No evidence of significant fibrotic interstitial lung disease. No significant air trapping. 2. Pericardial thickening and nonspecific trace pericardial effusion, somewhat increased compared to prior examination. 3.  Coronary artery disease. Electronically Signed   By: Eddie Candle M.D.   On: 09/29/2018 13:38    Pending Labs [employer] (From admission, onward)    Start     Ordered   Signed and  Occupational hygienist morning,   R     Signed and Held   Signed and Held  Troponin I - Now Then Q6H  Now then every 6 hours,   R     Signed and Held          Vitals/Pain Today's Vitals   09/29/18 1015 09/29/18 1030 09/29/18 1354 09/29/18 1355  BP:  (!) 88/61 112/65   Pulse: 66 63 73   Resp: 17 17 17    Temp:   97.9 F (36.6 C)   TempSrc:   Oral   SpO2: 96% 96% 96%   Weight:  PainSc:   0-No pain 0-No pain    Isolation Precautions No active isolations  Medications Medications  sodium chloride flush (NS) 0.9 % injection 3 mL (3 mLs Intravenous Not Given 09/29/18 0830)  aspirin chewable tablet 324 mg (324 mg Oral Given 09/29/18 1610)    Mobility walks Moderate fall risk   Focused Assessments   R Recommendations: See Admitting Provider Note  Report given to:   Additional Notes

## 2018-09-29 NOTE — TOC Initial Note (Signed)
Transition of Care [facility]) - Initial/Assessment Note    Patient Details  Name: [name] MRN: [id number] Date of Birth: 1941-04-22  Transition of Care [facility]) CM/SW Contact:    Zerline Melchior Dimitri Ped, LCSW Phone Number: 09/29/2018, 7:20 PM  Clinical Narrative:     Pt is a 78 year old Caucasian male admitted under observation with the following Dx: Chest pain. Pt report that prior to his ED visit, he resided at home with his wife. Pt reports that he can perform all ADLs independently and also transports himself to and from medical appointments.   Pt reports that his PCP is Learta Codding, MD located in [place], [place]. Pt states that hes been dealing with chest pains since about two months ago. Pt goes into detail and states that he underwent a catheterization procedure to assist with the pain,  Pt hasnt noticed any difference. Dr Claiborne Billings facilitated procedure.   No hx of HH. No need for DME or assitive devices   TOC team to continue following for possible discharge needs.   [facility]  Clinical Social Worker  Ph: [phone]  Expected Discharge Plan: Home/Self Care Barriers to Discharge: No Barriers Identified   Patient Goals and CMS Choice Patient states their goals for this hospitalization and ongoing recovery are:: to return home       Expected Discharge Plan and Services Expected Discharge Plan: Home/Self Care       Living arrangements for the past 2 months: Single Family Home                                      Prior Living Arrangements/Services Living arrangements for the past 2 months: Single Family Home Lives with:: Spouse Patient language and need for interpreter reviewed:: Yes Do you feel safe going back to the place where you live?: Yes      Need for Family Participation in Patient Care: No (Comment) Care giver support system in place?: Yes (comment)   Criminal Activity/Legal Involvement Pertinent to Current  Situation/Hospitalization: No - Comment as needed  Activities of Daily Living Home Assistive Devices/Equipment: Cane (specify quad or straight) ADL Screening (condition at time of admission) Patient's cognitive ability adequate to safely complete daily activities?: Yes Is the patient deaf or have difficulty hearing?: Yes Does the patient have difficulty seeing, even when wearing glasses/contacts?: No Does the patient have difficulty concentrating, remembering, or making decisions?: No Patient able to express need for assistance with ADLs?: No Does the patient have difficulty dressing or bathing?: No Independently performs ADLs?: Yes (appropriate for developmental age) Does the patient have difficulty walking or climbing stairs?: No Weakness of Legs: None Weakness of Arms/Hands: None  Permission Sought/Granted Permission sought to share information with : Case Manager, Family Supports Permission granted to share information with : Yes, Verbal Permission Granted              Emotional Assessment Appearance:: Appears stated age Attitude/Demeanor/Rapport: Engaged Affect (typically observed): Accepting, Pleasant, Calm Orientation: : Oriented to Self, Oriented to  Time, Oriented to Situation, Oriented to Place   Psych Involvement: No (comment)  Admission diagnosis:  Chest pain, unspecified type [R07.9] Patient Active Problem List   Diagnosis Date Noted  . Chest pain 09/29/2018  . Unstable angina ([place])   . Hyponatremia 09/06/2016  . History of prostate cancer 12/17/2013  . Leg weakness, bilateral 09/17/2013  . Stiffness of  joints, not elsewhere classified, multiple sites 09/17/2013  . Radicular leg pain 09/17/2013  . Loss of weight 11/27/2012  . Hypothyroidism 11/27/2012  . CONSTIPATION 07/01/2008  . RECTAL BLEEDING 07/01/2008  . PERSONAL HX COLONIC POLYPS 07/01/2008  . PITUITARY MACROADENOMA 06/27/2008  . HYPERCHOLESTEROLEMIA 06/27/2008  . Essential hypertension 06/27/2008   . GERD 06/27/2008  . COLONIC POLYPS 07/03/2003  . DIVERTICULOSIS, COLON 07/03/2003   PCP:  [name], MD Pharmacy:   [facility] [address] [place] - [place] [address] [place] [address] [place] [address] Phone: [phone] Fax: [phone]  [place], [place] [address] Phone: [phone] Fax: [phone]     Social Determinants of Health (SDOH) Interventions    Readmission Risk Interventions No flowsheet data found.

## 2018-09-29 NOTE — ED Provider Notes (Signed)
[facility] EMERGENCY DEPARTMENT Provider Note   CSN: [id number] Arrival date & time: 09/29/18  7741    History   Chief Complaint Chief Complaint  Patient presents with  . Chest Pain    HPI [name] is a 78 y.o. male.     HPI Patient with episodic central chest pain over the last 2 days.  States that this morning he woke up and developed chest pain which he described as sharp and worse with deep breathing.  Became diaphoretic and felt that he might pass out.  Took nitroglycerin with some improvement.  States the pain is "easing off" at present.  Denies recent cough, fever or chills.  No new lower extremity swelling or pain.  Recent cardiac catheterization 1 month ago with 80% stenosis of his RCA which is being medically managed. Past Medical History:  Diagnosis Date  . Arthritis   . Colon polyps    Adenomatous  . Diverticulosis   . Essential hypertension   . GERD (gastroesophageal reflux disease)   . Hypercholesterolemia   . Pituitary microadenoma ([place])    Status post resection 2009  . Prostate cancer [facility])     Patient Active Problem List   Diagnosis Date Noted  . Unstable angina ([place])   . Hyponatremia 09/06/2016  . History of prostate cancer 12/17/2013  . Leg weakness, bilateral 09/17/2013  . Stiffness of joints, not elsewhere classified, multiple sites 09/17/2013  . Radicular leg pain 09/17/2013  . Loss of weight 11/27/2012  . Hypothyroidism 11/27/2012  . CONSTIPATION 07/01/2008  . RECTAL BLEEDING 07/01/2008  . PERSONAL HX COLONIC POLYPS 07/01/2008  . PITUITARY MACROADENOMA 06/27/2008  . HYPERCHOLESTEROLEMIA 06/27/2008  . Essential hypertension 06/27/2008  . GERD 06/27/2008  . COLONIC POLYPS 07/03/2003  . DIVERTICULOSIS, COLON 07/03/2003    Past Surgical History:  Procedure Laterality Date  . BACK SURGERY  1991  . CHOLECYSTECTOMY  08/2007  . LEFT HEART CATH AND CORONARY ANGIOGRAPHY N/A 09/01/2018   Procedure: LEFT HEART CATH AND CORONARY ANGIOGRAPHY;   Surgeon: Troy Sine, MD;  Location: [place] CV LAB;  Service: Cardiovascular;  Laterality: N/A;  . PROSTATE SURGERY    . Removal of pituitary tumor  9/09        Home Medications    Prior to Admission medications   Medication Sig Start Date End Date Taking? Authorizing Provider  aspirin EC 81 MG tablet Take 81 mg by mouth daily.    [provider]  desmopressin (DDAVP) 0.1 MG tablet Take 0.1 mg by mouth 4 (four) times daily as needed (Pt takes 3 to 4 times daily).     [provider]  ezetimibe (ZETIA) 10 MG tablet Take 1 tablet (10 mg total) by mouth daily. 09/01/18   Kroeger, Lorelee Cover., PA-C  isosorbide mononitrate (IMDUR) 30 MG 24 hr tablet Take 1 tablet (30 mg total) by mouth daily. 09/01/18   Kroeger, Lorelee Cover., PA-C  levothyroxine (SYNTHROID, LEVOTHROID) 88 MCG tablet Take 88 mcg by mouth every other day.    [provider]  metoprolol tartrate (LOPRESSOR) 25 MG tablet Take 0.5 tablets (12.5 mg total) by mouth 2 (two) times daily. 09/01/18   Kroeger, Lorelee Cover., PA-C  nitroGLYCERIN (NITROSTAT) 0.4 MG SL tablet Place 1 tablet (0.4 mg total) under the tongue every 5 (five) minutes as needed for chest pain (Call 911 if still having chest pain after taking 3rd tablet). 08/30/18   [name], MD  omeprazole (PRILOSEC) 20 MG capsule TAKE 1 CAPSULE  BY MOUTH EVERY DAY 07/03/18   [name], MD    Family History Family History  Problem Relation Age of Onset  . Clotting disorder Father   . Heart disease Father   . Heart attack Father   . Colon cancer Neg Hx   . Diabetes Neg Hx     Social History Social History   Tobacco Use  . Smoking status: Former Smoker    Types: Cigarettes  . Smokeless tobacco: Former Systems developer    Quit date: 11/03/1977  Substance Use Topics  . Alcohol use: Yes    Alcohol/week: 0.0 standard drinks    Comment: one drink a day per pt  . Drug use: Not Currently     Allergies   Codeine; Losartan; Morphine; Amlodipine;  Statins; and Tramadol   Review of Systems Review of Systems  Constitutional: Negative for chills and fever.  HENT: Negative for sinus pressure, sore throat and trouble swallowing.   Respiratory: Positive for shortness of breath. Negative for cough.   Cardiovascular: Positive for chest pain. Negative for palpitations and leg swelling.  Gastrointestinal: Negative for abdominal pain, constipation, diarrhea, nausea and vomiting.  Genitourinary: Negative for flank pain.  Musculoskeletal: Negative for back pain, myalgias and neck pain.  Skin: Negative for rash and wound.  Neurological: Negative for dizziness, weakness, light-headedness, numbness and headaches.  All other systems reviewed and are negative.    Physical Exam Updated Vital Signs BP (!) 88/61   Pulse 63   Temp 98.1 F (36.7 C) (Oral)   Resp 17   Wt 64 kg   SpO2 96%   BMI 22.10 kg/m   Physical Exam Vitals signs and nursing note reviewed.  Constitutional:      General: He is not in acute distress.    Appearance: Normal appearance. He is well-developed. He is not ill-appearing.  HENT:     Head: Normocephalic and atraumatic.     Nose: Nose normal.     Mouth/Throat:     Mouth: Mucous membranes are moist.     Pharynx: No oropharyngeal exudate or posterior oropharyngeal erythema.  Eyes:     Extraocular Movements: Extraocular movements intact.     Pupils: Pupils are equal, round, and reactive to light.  Neck:     Musculoskeletal: Normal range of motion and neck supple. No neck rigidity or muscular tenderness.  Cardiovascular:     Rate and Rhythm: Normal rate and regular rhythm.     Heart sounds: No murmur. No friction rub. No gallop.   Pulmonary:     Effort: Pulmonary effort is normal.     Breath sounds: Normal breath sounds.     Comments: Diminished breath sounds especially in bilateral bases. Abdominal:     General: Bowel sounds are normal.     Palpations: Abdomen is soft.     Tenderness: There is no abdominal  tenderness. There is no guarding or rebound.  Musculoskeletal: Normal range of motion.        General: No swelling, tenderness, deformity or signs of injury.     Right lower leg: No edema.     Left lower leg: No edema.  Lymphadenopathy:     Cervical: No cervical adenopathy.  Skin:    General: Skin is warm and dry.     Capillary Refill: Capillary refill takes less than 2 seconds.     Findings: No erythema or rash.  Neurological:     General: No focal deficit present.     Mental Status: He is  alert and oriented to person, place, and time.  Psychiatric:        Behavior: Behavior normal.      ED Treatments / Results  Labs (all labs ordered are listed, but only abnormal results are displayed) Labs Reviewed  BASIC METABOLIC PANEL - Abnormal; Notable for the following components:      Result Value   Sodium 128 (*)    Chloride 92 (*)    Glucose, Bld 115 (*)    All other components within normal limits  SARS CORONAVIRUS 2 (HOSPITAL ORDER, [facility])  CBC  TROPONIN I  D-DIMER, QUANTITATIVE (NOT AT [facility])  SEDIMENTATION RATE    EKG EKG Interpretation  Date/Time:  Friday Sep 29 2018 08:18:23 EDT Ventricular Rate:  77 PR Interval:    QRS Duration: 94 QT Interval:  381 QTC Calculation: 432 R Axis:   84 Text Interpretation:  Sinus rhythm Borderline prolonged PR interval Borderline right axis deviation Consider left ventricular hypertrophy Confirmed by Julianne Rice [phone]) on 09/29/2018 8:56:36 AM   Radiology Dg Chest 2 View  Result Date: 09/29/2018 CLINICAL DATA:  78 year old male with a history of chest pain EXAM: CHEST - 2 VIEW COMPARISON:  08/28/2018, 07/26/2016 FINDINGS: Cardiomediastinal silhouette unchanged in size and contour. Stigmata of emphysema, with increased retrosternal airspace, flattened hemidiaphragms, increased AP diameter, and hyperinflation on the AP view. Aortic calcifications. No pneumothorax pleural effusion or confluent  airspace disease. No acute displaced fracture. Degenerative changes of the thoracic spine. IMPRESSION: Emphysema and chronic changes without evidence of superimposed acute cardiopulmonary disease. Aortic atherosclerosis Electronically Signed   By: Corrie Mckusick D.O.   On: 09/29/2018 09:11    Procedures Procedures (including critical care time)  Medications Ordered in ED Medications  sodium chloride flush (NS) 0.9 % injection 3 mL (3 mLs Intravenous Not Given 09/29/18 0830)  aspirin chewable tablet 324 mg (324 mg Oral Given 09/29/18 6811)     Initial Impression / Assessment and Plan / ED Course  I have reviewed the triage vital signs and the nursing notes.  Pertinent labs & imaging results that were available during my care of the patient were reviewed by me and considered in my medical decision making (see chart for details).       EKG without acute findings.  Discussed with Dr. Johnsie Cancel.  Recommends hospitalist admission and will consult on patient.  Discussed with hospitalist will see patient emergency department and admit. Final Clinical Impressions(s) / ED Diagnoses   Final diagnoses:  Chest pain, unspecified type    ED Discharge Orders    None       Julianne Rice, MD 09/29/18 1141

## 2018-09-29 NOTE — Care Management Obs Status (Signed)
[place] NOTIFICATION   Patient Details  Name: [name] MRN: [id number] Date of Birth: [birth year]   Medicare Observation Status Notification Given:  Yes    Emrik Erhard Dimitri Ped, LCSW 09/29/2018, 7:16 PM

## 2018-09-29 NOTE — ED Triage Notes (Signed)
Patient complains of chest pain in center that started yesterday afternoon. States sweats and feeling like he was going to pass out this morning around 0700. Denies n/v.

## 2018-09-30 DIAGNOSIS — R079 Chest pain, unspecified: Secondary | ICD-10-CM | POA: Diagnosis not present

## 2018-09-30 LAB — BASIC METABOLIC PANEL
Anion gap: 9 (ref 5–15)
Anion gap: 11 (ref 5–15)
BUN: 13 mg/dL (ref 8–23)
BUN: 13 mg/dL (ref 8–23)
CO2: 23 mmol/L (ref 22–32)
CO2: 24 mmol/L (ref 22–32)
Calcium: 8.5 mg/dL — ABNORMAL LOW (ref 8.9–10.3)
Calcium: 8.5 mg/dL — ABNORMAL LOW (ref 8.9–10.3)
Chloride: 91 mmol/L — ABNORMAL LOW (ref 98–111)
Chloride: 94 mmol/L — ABNORMAL LOW (ref 98–111)
Creatinine, Ser: 0.8 mg/dL (ref 0.61–1.24)
Creatinine, Ser: 0.86 mg/dL (ref 0.61–1.24)
GFR calc Af Amer: >60 mL/min (ref >60)
GFR calc Af Amer: >60 mL/min (ref >60)
GFR calc non Af Amer: >60 mL/min (ref >60)
GFR calc non Af Amer: >60 mL/min (ref >60)
Glucose, Bld: 146 mg/dL — ABNORMAL HIGH (ref 70–99)
Glucose, Bld: 103 mg/dL — ABNORMAL HIGH (ref 70–99)
Potassium: 3.8 mmol/L (ref 3.5–5.1)
Potassium: 4.9 mmol/L (ref 3.5–5.1)
Sodium: 123 mmol/L — ABNORMAL LOW (ref 135–145)
Sodium: 129 mmol/L — ABNORMAL LOW (ref 135–145)

## 2018-09-30 MED ORDER — DESMOPRESSIN ACETATE 0.2 MG PO TABS
0.1000 mg | ORAL_TABLET | Freq: Two times a day (BID) | ORAL | Status: DC
  Filled 2018-09-30 (×5): qty 1

## 2018-09-30 NOTE — Discharge Summary (Addendum)
Physician Discharge Summary  [name] KVQ:[id number] DOB: Jan 15, 1941 DOA: 09/29/2018  PCP: [name], MD  Admit date: 09/29/2018 Discharge date: 09/30/2018  Time spent: 40 minutes  Recommendations for Outpatient Follow-up:  1. Follow up outpatient CBC/CMP 2. Follow up outpatient PFT's 3. Follow up with cardiology outpatient 4. Follow up outpatient TSH 5. Follow up sodium as outpatient and dose of desmopressin (sodium as low as 123 here, but on repeat, 129? Error? ?need to adjust dose of desmopressin) 6. Sed rate elevated, continue to follow outpatient  Discharge Diagnoses:  Active Problems:   Chest pain   Discharge Condition: stable  Diet recommendation: heart healthy  Filed Weights   09/29/18 0822 09/29/18 1648  Weight: 64 kg 62.4 kg    History of present illness:  [name] is [name] 78 y.o. male with medical history of coronary artery disease, hypertension, pituitary macroadenoma, and prostate cancer presenting with chest pain that started on 09/27/2018.  He states that he has chest discomfort at rest as well as with some activity.  He states that he has been doing some yard work as well as changing some tires on his wife's car without any worsening chest discomfort.  However he notes that occasionally when he takes his trash out to the sidewalk he does get short of breath and has intermittent chest discomfort.  He states that his chest discomfort has been constant since the evening of 09/27/2018.  On the morning of 09/29/2018, the patient felt short of breath and lightheaded with his continued central chest discomfort.  As result, he presented for further evaluation.  He denied any syncope.  He denied any palpitation.  The patient had recent heart catheterization on 09/01/2018 which showed an 80% stenosis in the nondominant RCA of 80%.  The patient was treated medically.  EF was 50 to 55%.  The patient was started on metoprolol, Zetia, and Imdur. In the emergency department, the  patient was afebrile hemodynamically stable saturating 96% on room air.  BMP, CBC, and EKG were unremarkable.  D-dimer was negative.  CXR showed emphysema. Cardiology was consulted to assist with management.  He was seen for Shelita Steptoe chest pain rule out.  He was seen by cardiology who suspected the pain was pleuritic in nature and had low suspicion for cardiac chest pain.  He had high resolution chest CT which showed emphysema and trace pericardial effusion and pericardial thickening.  His symptoms were improved on hospital day 1 and he was discharged with outpatient follow up.  Hospital Course:  Chest pain -Description is pleuritic in nature -High res per cards with emphysemia, pericardial thickening and trace pericardial effusion -Continue aspirin -Continue metoprolol tartrate -Continue Imdur -08/28/2018 CT angiogram chest negative for PE -D-dimer negative on 09/29/2018 -troponin negative  COPD -Patient does not have any formal PFTs -Suspect this may be partly contributing to the patient's dyspnea -He will need formal PFTs after discharge -No wheezing or signs of exacerbation presently  Hyponatremia -Secondary to DDAVP -This has been chronic -Monitor serially - consider dosing in follow up   Hypothyroidism -Continue Synthroid -Check TSH as outpatient   Procedures:  none  Consultations:  cardiology  Discharge Exam: Vitals:   09/30/18 0510 09/30/18 1308  BP: (!) 131/59 112/63  Pulse: 81 79  Resp: 16 16  Temp: 99.2 F (37.3 C) 98.5 F (36.9 C)  SpO2: 97% 98%   Feels better Pleuritic CP improved.  General: No acute distress. Cardiovascular: Heart sounds show [name] regular rate, and rhythm.  Lungs: Clear to auscultation bilaterally  Abdomen: Soft, nontender, nondistended  Neurological: Alert and oriented 3. Moves all extremities 4. Cranial nerves II through XII grossly intact. Skin: Warm and dry. No rashes or lesions. Extremities: No clubbing or cyanosis. No edema.   Psychiatric: Mood and affect are normal. Insight and judgment are appropriate.  Discharge Instructions   Discharge Instructions    Call MD for:  difficulty breathing, headache or visual disturbances   Complete by:  As directed    Call MD for:  extreme fatigue   Complete by:  As directed    Call MD for:  persistant dizziness or light-headedness   Complete by:  As directed    Call MD for:  persistant nausea and vomiting   Complete by:  As directed    Call MD for:  redness, tenderness, or signs of infection (pain, swelling, redness, odor or green/yellow discharge around incision site)   Complete by:  As directed    Call MD for:  severe uncontrolled pain   Complete by:  As directed    Call MD for:  temperature >100.4   Complete by:  As directed    Diet - low sodium heart healthy   Complete by:  As directed    Discharge instructions   Complete by:  As directed    You were seen for chest pain.  You were seen by cardiology who felt this pain was most likely not cardiac.  You should get outpatient lung function tests.  Your sodium was low.  Please follow up your sodium with your PCP.  Your desmopressin may need to be reduced if your sodium continues to be low.  Follow up your imaging with your PCP as an outpatient.   Return for new, recurrent, or worsening symptoms.  Please ask your PCP to request records from this hospitalization so they know what was done and what the next steps will be.   Increase activity slowly   Complete by:  As directed      Allergies as of 09/30/2018      Reactions   Codeine    Losartan    Patient had elevated potassium and also slight elevation of creatinine-October 2017   Morphine    Amlodipine    Complained of neck pain with medication-November 2017   Statins Other (See Comments)   Body aches intolerant to statins   Tramadol    Sweat, light-headed      Medication List    TAKE these medications   aspirin EC 81 MG tablet Take 81 mg by  mouth daily.   desmopressin 0.1 MG tablet Commonly known as:  DDAVP Take 0.1 mg by mouth 4 (four) times daily as needed (Pt takes 3 to 4 times daily).   ezetimibe 10 MG tablet Commonly known as:  ZETIA Take 1 tablet (10 mg total) by mouth daily.   isosorbide mononitrate 30 MG 24 hr tablet Commonly known as:  IMDUR Take 1 tablet (30 mg total) by mouth daily.   levothyroxine 88 MCG tablet Commonly known as:  SYNTHROID Take 88 mcg by mouth every other day.   metoprolol tartrate 25 MG tablet Commonly known as:  LOPRESSOR Take 0.5 tablets (12.5 mg total) by mouth 2 (two) times daily.   nitroGLYCERIN 0.4 MG SL tablet Commonly known as:  NITROSTAT Place 1 tablet (0.4 mg total) under the tongue every 5 (five) minutes as needed for chest pain (Call 911 if still having chest pain after taking 3rd tablet).  omeprazole 20 MG capsule Commonly known as:  PRILOSEC TAKE 1 CAPSULE BY MOUTH EVERY DAY      Allergies  Allergen Reactions  . Codeine   . Losartan     Patient had elevated potassium and also slight elevation of creatinine-October 2017  . Morphine   . Amlodipine     Complained of neck pain with medication-November 2017  . Statins Other (See Comments)    Body aches intolerant to statins  . Tramadol     Sweat, light-headed      The results of significant diagnostics from this hospitalization (including imaging, microbiology, ancillary and laboratory) are listed below for reference.    Significant Diagnostic Studies: Dg Chest 2 View  Result Date: 09/29/2018 CLINICAL DATA:  78 year old male with Kevonna Nolte history of chest pain EXAM: CHEST - 2 VIEW COMPARISON:  08/28/2018, 07/26/2016 FINDINGS: Cardiomediastinal silhouette unchanged in size and contour. Stigmata of emphysema, with increased retrosternal airspace, flattened hemidiaphragms, increased AP diameter, and hyperinflation on the AP view. Aortic calcifications. No pneumothorax pleural effusion or confluent airspace disease. No  acute displaced fracture. Degenerative changes of the thoracic spine. IMPRESSION: Emphysema and chronic changes without evidence of superimposed acute cardiopulmonary disease. Aortic atherosclerosis Electronically Signed   By: [name] D.O.   On: 09/29/2018 09:11   Ct Chest High Resolution  Result Date: 09/29/2018 CLINICAL DATA:  Chest pain, hypoxemia, possible ILD EXAM: CT CHEST WITHOUT CONTRAST TECHNIQUE: Multidetector CT imaging of the chest was performed following the standard protocol without intravenous contrast. High resolution imaging of the lungs, as well as inspiratory and expiratory imaging, was performed. COMPARISON:  CT chest, 08/28/2018, 10/16/2013 FINDINGS: Cardiovascular: Aortic atherosclerosis. Scattered coronary artery calcifications. Pericardial thickening and trace pericardial effusion, somewhat increased compared to prior examination. Subtle calcifications of the posterior/left ventricular pericardium. Mediastinum/Nodes: No enlarged mediastinal, hilar, or axillary lymph nodes. Thyroid gland, trachea, and esophagus demonstrate no significant findings. Lungs/Pleura: Moderate centrilobular emphysema. Mild bibasilar scarring or atelectasis. No significant air trapping on expiratory phase and no pleural effusion or pneumothorax. Upper Abdomen: No acute abnormality. Musculoskeletal: No chest wall mass or suspicious bone lesions identified. IMPRESSION: 1. Moderate emphysema. No evidence of significant fibrotic interstitial lung disease. No significant air trapping. 2. Pericardial thickening and nonspecific trace pericardial effusion, somewhat increased compared to prior examination. 3.  Coronary artery disease. Electronically Signed   By: [name] M.D.   On: 09/29/2018 13:38    Microbiology: Recent Results (from the past 240 hour(s))  SARS Coronavirus 2 (CEPHEID - Performed in [place] hospital lab), Hosp Order     Status: None   Collection Time: 09/29/18  9:29 AM  Result Value  Ref Range Status   SARS Coronavirus 2 NEGATIVE NEGATIVE Final    Comment: (NOTE) If result is NEGATIVE SARS-CoV-2 target nucleic acids are NOT DETECTED. The SARS-CoV-2 RNA is generally detectable in upper and lower  respiratory specimens during the acute phase of infection. The lowest  concentration of SARS-CoV-2 viral copies this assay can detect is 250  copies / mL. [name] negative result does not preclude SARS-CoV-2 infection  and should not be used as the sole basis for treatment or other  patient management decisions.  [name] negative result may occur with  improper specimen collection / handling, submission of specimen other  than nasopharyngeal swab, presence of viral mutation(s) within the  areas targeted by this assay, and inadequate number of viral copies  (<250 copies / mL). [name] negative result must be combined with clinical  observations, patient  history, and epidemiological information. If result is POSITIVE SARS-CoV-2 target nucleic acids are DETECTED. The SARS-CoV-2 RNA is generally detectable in upper and lower  respiratory specimens dur ing the acute phase of infection.  Positive  results are indicative of active infection with SARS-CoV-2.  Clinical  correlation with patient history and other diagnostic information is  necessary to determine patient infection status.  Positive results do  not rule out bacterial infection or co-infection with other viruses. If result is PRESUMPTIVE POSTIVE SARS-CoV-2 nucleic acids MAY BE PRESENT.   [name] presumptive positive result was obtained on the submitted specimen  and confirmed on repeat testing.  While 2019 novel coronavirus  (SARS-CoV-2) nucleic acids may be present in the submitted sample  additional confirmatory testing may be necessary for epidemiological  and / or clinical management purposes  to differentiate between  SARS-CoV-2 and other Sarbecovirus currently known to infect humans.  If clinically indicated additional testing with an  alternate test  methodology [phone]) is advised. The SARS-CoV-2 RNA is generally  detectable in upper and lower respiratory sp ecimens during the acute  phase of infection. The expected result is Negative. Fact Sheet for Patients:  StrictlyIdeas.no Fact Sheet for Healthcare Providers: BankingDealers.co.za This test is not yet approved or cleared by the [place] FDA and has been authorized for detection and/or diagnosis of SARS-CoV-2 by FDA under an Emergency Use Authorization (EUA).  This EUA will remain in effect (meaning this test can be used) for the duration of the COVID-19 declaration under Section 564(b)(1) of the Act, 21 U.S.C. section 360bbb-3(b)(1), unless the authorization is terminated or revoked sooner. Performed at [facility], [address], [place],  [address]      Labs: Basic Metabolic Panel: Recent Labs  Lab 09/29/18 0824 09/30/18 0555 09/30/18 1414  NA 128* 123* 129*  K 4.4 3.8 4.9  CL 92* 91* 94*  CO2 24 23 24   GLUCOSE 115* 146* 103*  BUN 12 13 13   CREATININE 0.92 0.80 0.86  CALCIUM 9.1 8.5* 8.5*   Liver Function Tests: No results for input(s): AST, ALT, ALKPHOS, BILITOT, PROT, ALBUMIN in the last 168 hours. No results for input(s): LIPASE, AMYLASE in the last 168 hours. No results for input(s): AMMONIA in the last 168 hours. CBC: Recent Labs  Lab 09/29/18 0824  WBC 10.4  HGB 15.4  HCT 45.2  MCV 88.8  PLT 215   Cardiac Enzymes: Recent Labs  Lab 09/29/18 0824 09/29/18 1711 09/29/18 2254  TROPONINI <0.03 <0.03 <0.03   BNP: BNP (last 3 results) No results for input(s): BNP in the last 8760 hours.  ProBNP (last 3 results) No results for input(s): PROBNP in the last 8760 hours.  CBG: No results for input(s): GLUCAP in the last 168 hours.     Signed:  Fayrene Helper MD.  [facility] 09/30/2018, 3:04 PM

## 2018-09-30 NOTE — Progress Notes (Signed)
IV removed, 2x2 gauze and paper tape applied to site, patient tolerated well. Reviewed AVS with patient who verbalized understanding. Patient taken to short stay lobby via wheelchair and transported home by his wife.

## 2018-10-06 ENCOUNTER — Ambulatory Visit (INDEPENDENT_AMBULATORY_CARE_PROVIDER_SITE_OTHER): Payer: Medicare Other | Admitting: Family Medicine

## 2018-10-06 ENCOUNTER — Other Ambulatory Visit: Payer: Self-pay

## 2018-10-06 DIAGNOSIS — R0609 Other forms of dyspnea: Secondary | ICD-10-CM

## 2018-10-06 DIAGNOSIS — I209 Angina pectoris, unspecified: Secondary | ICD-10-CM | POA: Diagnosis not present

## 2018-10-06 DIAGNOSIS — R079 Chest pain, unspecified: Secondary | ICD-10-CM | POA: Diagnosis not present

## 2018-10-06 NOTE — Progress Notes (Signed)
   Subjective:  Video not capable for the patient  Patient ID: [name], male    DOB: 04-29-1941, 78 y.o.   MRN: [id number]  HPI  Patient calls for a follow up on recent ER visit for chest pain. Patient may need referral to pulmonology.  Patient states he had to stop the meds the cardiologist put him on  The cholesterol pills made him hurt so bad he couldn't stand it - all always have and the heart pill mad him so constipated he couldn't take it Patient does state that he gets little short of breath with activity he also relates that he feels like some of the medicine is causing problems He feels like the Zetia is causing muscle aches in his neck and so he stopped taking it He relates the Imdur he feels it is causing constipation issues so he stopped taking it Patient had recent catheterization which showed 80% blockage Had a CAT scan on recent hospitalization that showed emphysema He does experience shortness of breath with activity  Virtual Visit via Video Note  I connected with [name] on 10/06/18 at 11:00 AM EDT by a video enabled telemedicine application and verified that I am speaking with the correct person using two identifiers.  Location: Patient: home Provider: office   I discussed the limitations of evaluation and management by telemedicine and the availability of in person appointments. The patient expressed understanding and agreed to proceed.  History of Present Illness:    Observations/Objective:   Assessment and Plan:   Follow Up Instructions:    I discussed the assessment and treatment plan with the patient. The patient was provided an opportunity to ask questions and all were answered. The patient agreed with the plan and demonstrated an understanding of the instructions.   The patient was advised to call back or seek an in-person evaluation if the symptoms worsen or if the condition fails to improve as anticipated.  I provided 18 minutes of  non-face-to-face time during this encounter.      Review of Systems  Constitutional: Negative for activity change, fatigue and fever.  HENT: Negative for congestion and rhinorrhea.   Respiratory: Positive for shortness of breath. Negative for cough.   Cardiovascular: Positive for chest pain. Negative for leg swelling.  Gastrointestinal: Negative for abdominal pain, diarrhea and nausea.  Genitourinary: Negative for dysuria and hematuria.  Neurological: Negative for weakness and headaches.  Psychiatric/Behavioral: Negative for agitation and behavioral problems.       Objective:   Physical Exam  Today's visit was via telephone Physical exam was not possible for this visit       Assessment & Plan:  I recommend referral to pulmonology for PFTs and evaluation of the emphysema this could be causing some of his shortness of breath hold off on any type of inhalers currently  I believe patient having angina related issues with shortness of breath I recommend reinitiating Imdur he agrees to do a half a tablet daily  Hyperlipidemia patient states he will try the Zetia again in the near future and see if it gets along with them I explained the mechanism of action so hopefully he is willing to try it  Patient has a follow-up office visit in June he should keep that sooner if any problems

## 2018-10-23 ENCOUNTER — Encounter: Payer: Self-pay | Admitting: Family Medicine

## 2018-10-23 ENCOUNTER — Other Ambulatory Visit: Payer: Self-pay

## 2018-10-23 ENCOUNTER — Ambulatory Visit (INDEPENDENT_AMBULATORY_CARE_PROVIDER_SITE_OTHER): Payer: Medicare Other | Admitting: Family Medicine

## 2018-10-23 VITALS — BP 134/90 | Temp 96.9°F | Ht 67.0 in | Wt 137.0 lb

## 2018-10-23 DIAGNOSIS — D692 Other nonthrombocytopenic purpura: Secondary | ICD-10-CM | POA: Diagnosis not present

## 2018-10-23 DIAGNOSIS — Z23 Encounter for immunization: Secondary | ICD-10-CM | POA: Diagnosis not present

## 2018-10-23 DIAGNOSIS — W2209XA Striking against other stationary object, initial encounter: Secondary | ICD-10-CM | POA: Diagnosis not present

## 2018-10-23 DIAGNOSIS — Z0000 Encounter for general adult medical examination without abnormal findings: Secondary | ICD-10-CM

## 2018-10-23 DIAGNOSIS — R0609 Other forms of dyspnea: Secondary | ICD-10-CM

## 2018-10-23 DIAGNOSIS — S41112A Laceration without foreign body of left upper arm, initial encounter: Secondary | ICD-10-CM | POA: Diagnosis not present

## 2018-10-23 DIAGNOSIS — H00012 Hordeolum externum right lower eyelid: Secondary | ICD-10-CM | POA: Diagnosis not present

## 2018-10-23 HISTORY — DX: Other nonthrombocytopenic purpura: D69.2

## 2018-10-23 MED ORDER — SHINGRIX 50 MCG/0.5ML IM SUSR: 0.5000 mL | Freq: Once | INTRAMUSCULAR | 1 refills | Status: AC

## 2018-10-23 MED ORDER — CEPHALEXIN 500 MG PO CAPS: ORAL_CAPSULE | ORAL | 0 refills | Status: DC

## 2018-10-23 MED ORDER — SHINGRIX 50 MCG/0.5ML IM SUSR: 0.5000 mL | Freq: Once | INTRAMUSCULAR | 1 refills | Status: DC

## 2018-10-23 NOTE — Progress Notes (Addendum)
Subjective:    Patient ID: [name], male    DOB: [birth year], 78 y.o.   MRN: [id number]  Hypertension This is a chronic problem. Pertinent negatives include no chest pain, headaches or shortness of breath. There are no compliance problems.   Pt states that Imdur is causing headache, constipation and nails to break easily. Pt states that he is taking two laxatives twice a week.  The patient comes in today for a wellness visit. Patient has an area underneath his right eyelid that he is concerned about it is tender looks like a stye he has had it over the past 1 to 2 weeks denies any other trouble  He does get out of breath with doing activity but he thinks it is more his lungs rather than a sign of heart issues   A review of their health history was completed.  A review of medications was also completed.  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   Additional concerns: pt has a bump under his right eyelid. Has been there for a few weeks.     Review of Systems  Constitutional: Negative for diaphoresis and fatigue.  HENT: Negative for congestion and rhinorrhea.   Respiratory: Negative for cough and shortness of breath.   Cardiovascular: Negative for chest pain and leg swelling.  Gastrointestinal: Negative for abdominal pain and diarrhea.  Skin: Negative for color change and rash.  Neurological: Negative for dizziness and headaches.  Psychiatric/Behavioral: Negative for behavioral problems and confusion.       Objective:   Physical Exam Vitals signs reviewed.  Constitutional:      General: He is not in acute distress. HENT:     Head: Normocephalic and atraumatic.  Eyes:     General:        Right eye: No discharge.        Left eye: No discharge.  Neck:      Trachea: No tracheal deviation.  Cardiovascular:     Rate and Rhythm: Normal rate and regular rhythm.     Heart sounds: Normal heart sounds. No murmur.  Pulmonary:     Effort: Pulmonary effort is normal. No respiratory distress.     Breath sounds: Normal breath sounds.  Lymphadenopathy:     Cervical: No cervical adenopathy.  Skin:    General: Skin is warm and dry.  Neurological:     Mental Status: He is alert.     Coordination: Coordination normal.  Psychiatric:        Behavior: Behavior normal.    Patient cut his arm last week less than a thin she is healing on its own does need a tetanus shot  He does have subjective burning on the right side of his calf with subjective numbness in his toes he has had this problem off and on     Assessment & Plan:  Adult wellness-complete.wellness physical was conducted today. Importance of diet and exercise were discussed in detail.  In addition to this a discussion regarding safety was also covered. We also reviewed over immunizations and gave recommendations regarding current immunization needed for age.  In addition to this additional areas were also touched on including: Preventative health exams needed:  Colonoscopy up-to-date Has senile purpura stable on both arms Patient was advised yearly wellness exam  Recommend referral for pulmonary this was put in they have not heard anything from pulmonary yet we will resend that  Stye underneath the right eye should gradually get better with antibiotics warm compresses if it does not clear up within 2 weeks to notify us  Intermittent burning in the right leg probably related to spine patient will watch it does not want to do anything at this time  Tetanus shot recommended because of lacerations of the left arm  Shingles vaccine recommended  Given his constipation I recommend using MiraLAX if this does not improve things he needs to let us know he is up-to-date on his colonoscopy but if  this persists he will need further evaluation

## 2018-10-23 NOTE — Patient Instructions (Signed)
Thank you for coming for your annual wellness visit.  Please follow through on any advice that was given to you by today's visit. Remember to maintain compliance with your medications as discussed today.  Also remember it is important to eat a healthy diet and to stay physically active on a daily basis.  Please follow through with any testing or recommended followup office visits as was discussed today. You are due the following test coming up:  ALL- Colonoscopy-up to date            Vaccines-do Shingrix vaccine    Tdap today    Flu vaccine this fall   We will work on pulmonary referral     Shingrix and shingles prevention: know the facts!   Shingrix is a very effective vaccine to prevent shingles.   Shingles is a reactivation of chickenpox -more than 99% of Americans born before 1980 have had chickenpox even if they do not remember it. One in every 10 people who get shingles have severe long-lasting nerve pain as a result.   33 out of a 100 older adults will get shingles if they are unvaccinated.     This vaccine is very important for your health This vaccine is indicated for anyone 50 years or older. You can get this vaccine even if you have already had shingles because you can get the disease more than once in a lifetime.  Your risk for shingles and its complications increases with age.  This vaccine has 2 doses.  The second dose would be 2 to 6 months after the first dose.  If you had Zostavax vaccine in the past you should still get Shingrix. ( Zostavax is only 70% effective and it loses significant strength over a few years .)  This vaccine is given through the pharmacy.  The cost of the vaccine is through your insurance. The pharmacy can inform you of the total costs.  Common side effects including soreness in the arm, some redness and swelling, also some feel fatigue muscle soreness headache low-grade fever.  Side effects typically go away within 2 to 3  days. Remember-the pain from shingles can last a lifetime but these side effects of the vaccine will only last a few days at most. It is very important to get both doses in order to protect yourself fully.   Please get this vaccine at your earliest convenience at your trusted pharmacy.

## 2018-11-13 ENCOUNTER — Telehealth: Payer: Self-pay | Admitting: Cardiology

## 2018-11-13 NOTE — Telephone Encounter (Signed)
Patient called in regards to statement that he received. Would like to speak with someone.   [phone] .

## 2018-12-20 DIAGNOSIS — E032 Hypothyroidism due to medicaments and other exogenous substances: Secondary | ICD-10-CM | POA: Diagnosis not present

## 2018-12-20 DIAGNOSIS — D443 Neoplasm of uncertain behavior of pituitary gland: Secondary | ICD-10-CM | POA: Diagnosis not present

## 2018-12-27 DIAGNOSIS — D443 Neoplasm of uncertain behavior of pituitary gland: Secondary | ICD-10-CM | POA: Diagnosis not present

## 2018-12-27 DIAGNOSIS — E032 Hypothyroidism due to medicaments and other exogenous substances: Secondary | ICD-10-CM | POA: Diagnosis not present

## 2018-12-27 DIAGNOSIS — E232 Diabetes insipidus: Secondary | ICD-10-CM | POA: Diagnosis not present

## 2019-01-01 ENCOUNTER — Telehealth: Payer: Self-pay | Admitting: Family Medicine

## 2019-01-01 MED ORDER — OMEPRAZOLE 20 MG PO CPDR: DELAYED_RELEASE_CAPSULE | ORAL | 1 refills | Status: AC

## 2019-01-01 NOTE — Telephone Encounter (Signed)
Pt wants refill on omeprazole (PRILOSEC) 20 MG capsule   [facility] - [place], [place] - [address] AT [address]

## 2019-01-01 NOTE — Telephone Encounter (Signed)
Prescription sent electronically to pharmacy. Patient notified. 

## 2019-01-01 NOTE — Telephone Encounter (Signed)
May have 90 with 1 refill 

## 2019-01-05 ENCOUNTER — Other Ambulatory Visit: Payer: Self-pay

## 2019-01-05 ENCOUNTER — Ambulatory Visit (HOSPITAL_COMMUNITY)
Admission: RE | Admit: 2019-01-05 | Discharge: 2019-01-05 | Disposition: A | Discharge status: Home / Self Care | Payer: Medicare Other | Source: Ambulatory Visit | Attending: Nurse Practitioner | Admitting: Nurse Practitioner

## 2019-01-05 ENCOUNTER — Encounter: Payer: Self-pay | Admitting: Nurse Practitioner

## 2019-01-05 ENCOUNTER — Ambulatory Visit (INDEPENDENT_AMBULATORY_CARE_PROVIDER_SITE_OTHER): Payer: Medicare Other | Admitting: Nurse Practitioner

## 2019-01-05 VITALS — BP 136/88 | Temp 97.8°F | Wt 141.6 lb

## 2019-01-05 DIAGNOSIS — S60221A Contusion of right hand, initial encounter: Secondary | ICD-10-CM

## 2019-01-05 DIAGNOSIS — M79641 Pain in right hand: Secondary | ICD-10-CM

## 2019-01-05 DIAGNOSIS — I200 Unstable angina: Secondary | ICD-10-CM

## 2019-01-05 DIAGNOSIS — S61431A Puncture wound without foreign body of right hand, initial encounter: Secondary | ICD-10-CM | POA: Diagnosis not present

## 2019-01-05 NOTE — Progress Notes (Signed)
   Subjective:    Patient ID: [name], male    DOB: 1940-10-13, 77 y.o.   MRN: [id number]  HPI Pt here today for possible right hand fracture. Last night, pt states he was drilling a piece of metal and the drill got away with him. Pt states that the drill rotated right hand and landed in an awkward position. No break in the skin. Patient has taken Aleve and applied ice-packs to right hand with some improvement. States he was unable to extend fingers, however is able to now. Takes daily aspirin.    Review of Systems    Musc: Objective:   Physical Exam  General: Well-appearing. Alert and oriented. Cardiac: Normal heart rate and rhythm. No murmurs. Respiratory: Clear to ausculation in all lung fields. No adventitious lung sounds. Musculoskeletal: Left Hand: Full range of motion. Radial pulse +2. Capillary refill <3 seconds.    Right Hand: Localized edema and slight ecchymosis on dorsal, lateral aspect of hand. Mild tenderness present upon palpation. Limited range of motion. Unable to fully abduct digits. No abrasions or lacerations present. Radial pulse +2. Capillary refill <3 seconds. Right Elbow: Full range of motion. No tenderness. Right Wrist: Full range of motion. No tenderness.        Assessment & Plan:  1. Contusion of right hand, initial encounter - DG Hand Complete Right  -Apply ice-packs up to 48 hours. Continue to take Aleve as needed. Plan pending x-ray results. Warning precautions discussed.

## 2019-01-08 ENCOUNTER — Ambulatory Visit (INDEPENDENT_AMBULATORY_CARE_PROVIDER_SITE_OTHER): Payer: Medicare Other | Admitting: Orthopedic Surgery

## 2019-01-08 ENCOUNTER — Encounter: Payer: Self-pay | Admitting: Orthopedic Surgery

## 2019-01-08 ENCOUNTER — Other Ambulatory Visit: Payer: Self-pay

## 2019-01-08 VITALS — BP 157/70 | HR 61 | Ht 67.0 in | Wt 144.0 lb

## 2019-01-08 DIAGNOSIS — S60221A Contusion of right hand, initial encounter: Secondary | ICD-10-CM

## 2019-01-08 DIAGNOSIS — I200 Unstable angina: Secondary | ICD-10-CM | POA: Diagnosis not present

## 2019-01-08 NOTE — Patient Instructions (Signed)
Take it easy   Ok to return to normal activity as tolerated

## 2019-01-08 NOTE — Progress Notes (Signed)
Chief Complaint  Patient presents with  . Hand Pain    right hand vs electric drill on 01/04/19   78 year old male had his hand twisted when he was holding onto a drill and I board he never had any puncture wound  He complains of soreness of his right hand  Review of systems negative for numbness or tingling in the right hand  Physical Exam Vitals signs and nursing note reviewed.  Constitutional:      Appearance: Normal appearance.  Musculoskeletal: Normal range of motion.        General: Tenderness present. No swelling or deformity.     Comments: Tenderness over the dorsum of the right hand there is no puncture wound he can move his hand normally is neurovascularly intact there is no tendon injury  Neurological:     Mental Status: He is alert and oriented to person, place, and time.  Psychiatric:        Mood and Affect: Mood normal.     X-ray revealed no fracture or dislocation Hand contusion  Follow-up as needed

## 2019-01-10 ENCOUNTER — Ambulatory Visit: Payer: Medicare Other | Admitting: Orthopedic Surgery

## 2019-01-19 ENCOUNTER — Telehealth: Payer: Self-pay | Admitting: Family Medicine

## 2019-01-19 ENCOUNTER — Other Ambulatory Visit: Payer: Self-pay | Admitting: Medical

## 2019-01-19 NOTE — Telephone Encounter (Signed)
Looks like mcdowell just refilled zetia generic today, (may fill one mo if i'm wrong), the isosorbide dinatrate do one mo worth, but that is a med that is administered usually by the cardiologist let him know that for long term but will do since end of day on friday

## 2019-01-19 NOTE — Telephone Encounter (Signed)
This is a [facility] pt °

## 2019-01-19 NOTE — Telephone Encounter (Signed)
Contacted patient and informed that Dr.McDowell called in the zetia today and that we could do one month of the Indur due to that being something the cardiologist prescribes. Pt states he believes that is the medication that gave him headaches but is not sure. Pt will call back if he needs this refilled

## 2019-01-19 NOTE — Telephone Encounter (Signed)
Patient is requesting refills on ezetimibe 10 mg, Isosorbide mononitrate 30 mg that was prescribe to him when he was in the hospital and now needing refills on both completely out. [facility]

## 2019-01-19 NOTE — Telephone Encounter (Signed)
Please advise. Thank you

## 2019-01-30 DIAGNOSIS — Z23 Encounter for immunization: Secondary | ICD-10-CM | POA: Diagnosis not present

## 2019-02-15 ENCOUNTER — Other Ambulatory Visit: Payer: Self-pay | Admitting: Cardiology

## 2019-03-14 ENCOUNTER — Ambulatory Visit (INDEPENDENT_AMBULATORY_CARE_PROVIDER_SITE_OTHER): Payer: Medicare Other | Admitting: Internal Medicine

## 2019-03-14 ENCOUNTER — Telehealth: Payer: Self-pay | Admitting: Internal Medicine

## 2019-03-14 ENCOUNTER — Encounter: Payer: Self-pay | Admitting: Internal Medicine

## 2019-03-14 ENCOUNTER — Other Ambulatory Visit: Payer: Self-pay

## 2019-03-14 ENCOUNTER — Telehealth: Payer: Self-pay

## 2019-03-14 DIAGNOSIS — I3139 Other pericardial effusion (noninflammatory): Secondary | ICD-10-CM

## 2019-03-14 DIAGNOSIS — J449 Chronic obstructive pulmonary disease, unspecified: Secondary | ICD-10-CM | POA: Diagnosis not present

## 2019-03-14 DIAGNOSIS — R0609 Other forms of dyspnea: Secondary | ICD-10-CM

## 2019-03-14 DIAGNOSIS — I200 Unstable angina: Secondary | ICD-10-CM

## 2019-03-14 DIAGNOSIS — R0600 Dyspnea, unspecified: Secondary | ICD-10-CM

## 2019-03-14 DIAGNOSIS — I313 Pericardial effusion (noninflammatory): Secondary | ICD-10-CM

## 2019-03-14 LAB — CBC WITH DIFFERENTIAL/PLATELET
Basophils Absolute: 0.1 10*3/uL (ref 0.0–0.1)
Basophils Relative: 1.2 % (ref 0.0–3.0)
Eosinophils Absolute: 0.1 10*3/uL (ref 0.0–0.7)
Eosinophils Relative: 1.8 % (ref 0.0–5.0)
HCT: 46.2 % (ref 39.0–52.0)
Hemoglobin: 15.3 g/dL (ref 13.0–17.0)
Lymphocytes Relative: 22 % (ref 12.0–46.0)
Lymphs Abs: 1.8 10*3/uL (ref 0.7–4.0)
MCHC: 33.2 g/dL (ref 30.0–36.0)
MCV: 87.1 fl (ref 78.0–100.0)
Monocytes Absolute: 0.9 10*3/uL (ref 0.1–1.0)
Monocytes Relative: 10.8 % (ref 3.0–12.0)
Neutro Abs: 5.1 10*3/uL (ref 1.4–7.7)
Neutrophils Relative %: 64.2 % (ref 43.0–77.0)
Platelets: 182 10*3/uL (ref 150.0–400.0)
RBC: 5.31 Mil/uL (ref 4.22–5.81)
RDW: 14.7 % (ref 11.5–15.5)
WBC: 8 10*3/uL (ref 4.0–10.5)

## 2019-03-14 LAB — BASIC METABOLIC PANEL
BUN: 14 mg/dL (ref 6–23)
CO2: 31 mEq/L (ref 19–32)
Calcium: 9.7 mg/dL (ref 8.4–10.5)
Chloride: 98 mEq/L (ref 96–112)
Creatinine, Ser: 0.99 mg/dL (ref 0.40–1.50)
GFR: 73.46 mL/min (ref >60.00)
Glucose, Bld: 105 mg/dL — ABNORMAL HIGH (ref 70–99)
Potassium: 4.7 mEq/L (ref 3.5–5.1)
Sodium: 134 mEq/L — ABNORMAL LOW (ref 135–145)

## 2019-03-14 LAB — SEDIMENTATION RATE: Sed Rate: 11 mm/hr (ref 0–20)

## 2019-03-14 LAB — TSH: TSH: 2.36 u[IU]/mL (ref 0.35–4.50)

## 2019-03-14 LAB — BRAIN NATRIURETIC PEPTIDE: Pro B Natriuretic peptide (BNP): 49 pg/mL (ref 0.0–100.0)

## 2019-03-14 NOTE — Patient Instructions (Addendum)
teadmill is ok but use it at 1 mph with goal of gradually building up to  30 min of walking daily as tolerated  Make sure you check your oxygen saturations at highest level of activity to be sure it stays over 90%   Please remember to go to the lab department   for your tests - we will call you with the results when they are available.  Be sure to check the list of medications we give you to be sure it reflects exactly what you have at home and if it does not call us with any  discrepancies.  Schedule pfts next available with office visit same day with all medications in hand

## 2019-03-14 NOTE — Assessment & Plan Note (Addendum)
Onset 2018 much worse since early 2020 - CTa 08/28/18 neg PE - LHC  09/01/2018  Mid RCA lesion is 30% stenosed.  Prox RCA to Mid RCA lesion is 80% stenosed.  Ost 1st Mrg to 1st Mrg lesion is 30% stenosed.  Ost 1st Diag to 1st Diag lesion is 20% stenosed.  Prox LAD lesion is 20% stenosed.  The left ventricular ejection fraction is 50-55% by visual estimate.  The left ventricular systolic function is normal.  LV end diastolic pressure is mildly elevated = 18  - HRCT 09/29/18 emphysema s air trapping pericardial thickening and nonspecific trace pericardial effusion, somewhat increased compared to prior examination assoc with ESR 53 and no echo done  - 03/14/2019   Walked RA  2 laps @  approx 262f each @ slow pace due to R foot pain  stopped due to  End of study, no sob at sat 94%    Symptoms are markedly disproportionate to objective findings and not clear to what extent this is actually a pulmonary  problem but pt does appear to have difficult to sort out respiratory symptoms of unknown origin for which  DDX  = almost all start with A and  include Adherence, Ace Inhibitors, Acid Reflux, Active Sinus Disease, Alpha 1 Antitripsin deficiency, Anxiety masquerading as Airways dz,  ABPA,  Allergy(esp in young), Aspiration (esp in elderly), Adverse effects of meds,  Active smoking or Vaping, A bunch of PE's/clot burden (a few small clots can't cause this syndrome unless there is already severe underlying pulm or vascular dz with poor reserve),  Anemia or thyroid disorder, plus two Bs  = Bronchiectasis and Beta blocker use..and one C= CHF     Adherence is always the initial "prime suspect" and is a multilayered concern that requires a "trust but verify" approach in every patient - starting with knowing how to use medications, especially inhalers, correctly, keeping up with refills and understanding the fundamental difference between maintenance and prns vs those medications only taken for a very short  course and then stopped and not refilled.  - rec he do med reconciliation at home with our list and call with any discrepancies - return with all meds in hand using a trust but verify approach to confirm accurate Medication  Reconciliation The principal here is that until we are certain that the  patients are doing what we've asked, it makes no sense to ask them to do more.    Adverse effects of meds > none of the usual suspects listed  Alpha one AT def > send screen   Anemia/ thyroid dz > excluded today  ? Anxiety/depression/ deconditioning   > usually at the bottom of this list of usual suspects but should be  Included pt's based on H and P and note already on psychotropics and may interfere with adherence and also interpretation of response or lack thereof to symptom management which can be quite subjective.   ? A bunch of PE's > cta neg and D dimer nl - while a normal  or high normal value (seen commonly in the elderly or chronically ill)  may miss small peripheral pe, the clot burden with sob is moderately high and the d dimer  has a very high neg pred value if used in this setting.    ? Cardiac > note pericardial findings and high esr would explain the midline "pleuritic cp" which is almost never seen in dz of the visceral or parietal pleura and may need to  consider another look with echo to complete the w/u though repeat esr = 11 reassuring.    Total time devoted to counseling  > 50 % of initial 60 min office visit:  Reviewed extensive case hx  with pt/ performed device teaching  using a teach back technique which also  extended face to face time for this visit (see above) discussion of options/alternatives/ personally creating written customized instructions  in presence of pt  then going over those specific  Instructions directly with the pt including how to use all of the meds but in particular covering each new medication in detail and the difference between the maintenance= "automatic"  meds and the prns using an action plan format for the latter (If this problem/symptom => do that organization reading Left to right).  Please see AVS from this visit for a full list of these instructions which I personally wrote for this pt and  are unique to this visit.

## 2019-03-14 NOTE — Telephone Encounter (Signed)
Apt made

## 2019-03-14 NOTE — Assessment & Plan Note (Signed)
Quit smoking early 1980s with cough that 100% resolved  - HRCT 09/29/2018 c/w mod emphysema s air trapping or ILD    When respiratory symptoms begin or become refractory well after a patient reports complete smoking cessation,  Especially when this wasn't the case while they were smoking, a red flag is raised based on the work of Dr Kris Mouton which states:  if you quit smoking when your best day FEV1 is still well preserved it is highly unlikely you will progress to severe disease.  That is to say, once the smoking stops,  the symptoms should not suddenly erupt or markedly worsen.  If so, the differential diagnosis should include  obesity/deconditioning,  LPR/Reflux/Aspiration syndromes,  occult CHF, or  especially side effect of medications commonly used in this population.     There is very little to support significant copd or need for empirical  Bronchodilators with other likely emplanations for sympotms (see doe) so return with pfts to close the loop at next available slot.

## 2019-03-14 NOTE — Telephone Encounter (Signed)
lmam for pt to call back to schedule covid test

## 2019-03-14 NOTE — Telephone Encounter (Signed)
Order placed, note forwarded to [name] to schedule echo   Patient says he will call us back when he gets home and schedule echo apt he has a lot of other apts he says and needs to get home to check calender

## 2019-03-14 NOTE — Progress Notes (Signed)
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  with indolent onset doe starting in 2018 then midline  cp not necessarily related to ex/sob and described by others as "pleuritic" early 2020 > neg CTa for PE but symptoms gradually worse> admit:  Admit date: 09/29/2018 Discharge date: 09/30/2018       Discharge Diagnoses:  Active Problems:   Chest pain         Filed Weights   09/29/18 0822 09/29/18 1648  Weight: 64 kg 62.4 kg    History of present illness:  LAZARO ISENHOWER a 78 y.o.malewith medical history ofcoronary artery disease, hypertension, pituitary macroadenoma, and prostate cancer presenting with chest pain that started on 09/27/2018.He states that he has chest discomfortat rest as well as with some activity. He states that he has been doing some yard work as well as changing some tires on his wife's car without any worsening chest discomfort. However he notes that occasionally when he takes his trash out to the sidewalk he does get short of breath and has intermittent chest discomfort. He states that his chest discomfort has been constant since the evening of 09/27/2018. On the morning of 09/29/2018, the patient felt short of breath and lightheaded with his continued central chest discomfort. As result, he presented for further evaluation. He denied any syncope. He denied any palpitation. The patient had recent heart catheterization on 09/01/2018 which showed an 80% stenosis in the nondominant RCA of 80%. The patient was treated medically. EF was 50 to 55%. The patient was started on metoprolol, Zetia, and Imdur. In the emergency department, the patient was afebrile hemodynamically stable saturating 96% on room air. BMP, CBC, and EKG were unremarkable. D-dimer was negative. CXR showed  emphysema.Cardiology was consulted to assist with management.  He was seen for a chest pain rule out.  He was seen by cardiology who suspected the pain was pleuritic in nature and had low suspicion for cardiac chest pain.  He had high resolution chest CT which showed emphysema and trace pericardial effusion and pericardial thickening.  His symptoms were improved on hospital day 1 and he was discharged with outpatient follow up.  Hospital Course:  Chest pain -Description is pleuritic in nature -High res per cards with emphysemia, pericardial thickening and trace pericardial effusion -Continue aspirin -Continue metoprolol tartrate -Continue Imdur -08/28/2018 CT angiogram chest negative for PE -D-dimer negative on 09/29/2018 -troponin negative  COPD -Patient does not have any formal PFTs -Suspect this may be partly contributing to the patient's dyspnea -He will need formal PFTs after discharge -No wheezing or signs of exacerbation presently  Hyponatremia -Secondary to DDAVP -This has been chronic -Monitor serially - consider dosing in follow up   Hypothyroidism -Continue Synthroid -Check TSH as outpatient          History of Present Illness  03/14/2019  Pulmonary/ 1st office eval/Munira Polson  Chief Complaint  Patient presents with  . Consult    Consult for SOB. He states he developed SOB back in april that has slwly gotten worse. He states he was told he had abnormal CXR.   Dyspnea:  mb downhill ok about 50yards then one half back and stops with sob and cp  Wife's treadmill x 2 mph x 5 min flat then sob x one  Cough: no  Sleep: no resp symptoms SABA use: none   No obvious day to day or daytime variability or assoc excess/ purulent sputum or mucus plugs or hemoptysis or chest tightness, subjective wheeze or overt sinus or hb symptoms.   Sleeping  as above  without nocturnal  or early am exacerbation  of respiratory  c/o's or need for noct saba. Also denies any obvious  fluctuation of symptoms with weather or environmental changes or other aggravating or alleviating factors except as outlined above   No unusual exposure hx or h/o childhood pna/ asthma or knowledge of premature birth.  Current Allergies, Complete Past Medical History, Past Surgical History, Family History, and Social History were reviewed in [employer] record.  ROS  The following are not active complaints unless bolded Hoarseness, sore throat, dysphagia, dental problems, itching, sneezing,  nasal congestion or discharge of excess mucus or purulent secretions, ear ache,   fever, chills, sweats, unintended wt loss or wt gain, classically  pleuritic or reproducible exertional cp,  orthopnea pnd or arm/hand swelling  or leg swelling, presyncope, palpitations, abdominal pain, anorexia, nausea, vomiting, diarrhea  or change in bowel habits or change in bladder habits, change in stools or change in urine, dysuria, hematuria,  rash, arthralgias esp R ankle , visual complaints, headache, numbness, weakness or ataxia or problems with walking or coordination,  change in mood or  memory.           Past Medical History:  Diagnosis Date  . Arthritis   . Colon polyps    Adenomatous  . Diverticulosis   . Essential hypertension   . GERD (gastroesophageal reflux disease)   . Hypercholesterolemia   . Pituitary microadenoma ([place])    Status post resection 2009  . Prostate cancer ([place])   . Senile purpura ([place]) 10/23/2018    Outpatient Medications Prior to Visit  Medication Sig Dispense Refill  . aspirin EC 81 MG tablet Take 81 mg by mouth daily.    Marland Kitchen desmopressin (DDAVP) 0.1 MG tablet Take 0.1 mg by mouth 4 (four) times daily as needed (Pt takes 3 to 4 times daily).     Marland Kitchen ezetimibe (ZETIA) 10 MG tablet Take 1 tablet by mouth once daily 30 tablet 0  . isosorbide mononitrate (IMDUR) 30 MG 24 hr tablet Take 1 tablet (30 mg total) by mouth daily. 30 tablet 3  . levothyroxine (SYNTHROID,  LEVOTHROID) 88 MCG tablet Take 88 mcg by mouth every other day.    . metoprolol tartrate (LOPRESSOR) 25 MG tablet TAKE 1/2 (ONE-HALF) TABLET BY MOUTH TWICE DAILY -- NEEDS APPOINTMENT FOR FURTHER REFILLS 30 tablet 0  . nitroGLYCERIN (NITROSTAT) 0.4 MG SL tablet Place 1 tablet (0.4 mg total) under the tongue every 5 (five) minutes as needed for chest pain (Call 911 if still having chest pain after taking 3rd tablet). 50 tablet 0  . omeprazole (PRILOSEC) 20 MG capsule TAKE 1 CAPSULE BY MOUTH EVERY DAY 90 capsule 1      Objective:     BP 120/64   Pulse 69   Temp 98.7 F (37.1 C) (Temporal)   Ht _0  (1.702 m)   Wt 140 lb 3.2 oz (63.6 kg)   SpO2 96%   BMI 21.96 kg/m   SpO2: 96 % RA  Somber amb wm challenging historian tends to dwell on what others have told him was the cause of his symptoms rather than just reporting his symptoms accurately /consistently    HEENT : pt wearing mask not  removed for exam due to covid -19 concerns.    NECK :  without JVD/Nodes/TM/ nl carotid upstrokes bilaterally   LUNGS: no acc muscle use,  Nl contour chest which is clear to A and P bilaterally without cough on insp or exp maneuvers   CV:  RRR  no s3 or murmur or increase in P2, and no edema   ABD:  soft and nontender with nl inspiratory excursion in the supine position. No bruits or organomegaly appreciated, bowel sounds nl  MS:  Nl gait/ ext warm without deformities, calf tenderness, cyanosis or clubbing No obvious joint restrictions   SKIN: warm and dry without lesions    NEURO:  alert, approp, nl sensorium with  no motor or cerebellar deficits apparent.    I personally reviewed images and agree with radiology impression as follows:   Chest HRCT 09/29/2018  1. Moderate emphysema. No evidence of significant fibrotic interstitial lung disease. No significant air trapping.  2. Pericardial thickening and nonspecific trace pericardial effusion, somewhat increased compared to prior  examination.  3.  Coronary artery disease.   Labs ordered/ reviewed:      Chemistry      Component Value Date/Time   NA 134 (L) 03/14/2019 1113   NA 133 (L) 03/17/2018 0823   K 4.7 03/14/2019 1113   CL 98 03/14/2019 1113   CO2 31 03/14/2019 1113   BUN 14 03/14/2019 1113   BUN 10 03/17/2018 0823   CREATININE 0.99 03/14/2019 1113   CREATININE 0.87 10/15/2013 1633      Component Value Date/Time   CALCIUM 9.7 03/14/2019 1113   ALKPHOS 56 10/04/2017 1238   AST 22 10/04/2017 1238   ALT 16 (L) 10/04/2017 1238   BILITOT 1.6 (H) 10/04/2017 1238   BILITOT 1.5 (H) 09/08/2017 0811        Lab Results  Component Value Date   WBC 8.0 03/14/2019   HGB 15.3 03/14/2019   HCT 46.2 03/14/2019   MCV 87.1 03/14/2019   PLT 182.0 03/14/2019       EOS                                                               0.1                                    03/14/2019   Lab Results  Component Value Date   DDIMER 0.53 (H) 03/14/2019      Lab Results  Component Value Date   TSH 2.36 03/14/2019     Lab Results  Component Value Date   PROBNP 49.0 03/14/2019       Lab Results  Component Value Date   ESRSEDRATE 11 03/14/2019   ESRSEDRATE 53 (H) 09/29/2018   ESRSEDRATE 2 06/02/2015        Labs ordered 03/14/2019  :     alpha one AT phenotype         Assessment   COPD GOLD 0  Quit smoking early 1980s with cough that 100% resolved  - HRCT 09/29/2018 c/w mod emphysema s air trapping or ILD    When respiratory symptoms begin or become refractory well after a patient reports complete smoking cessation,  Especially when this wasn't the case while they were smoking, a red flag is raised based on the work of Dr Kris Mouton which states:  if you quit smoking when your best day FEV1 is still well preserved it is highly unlikely you will progress to severe disease.  That is to say, once the smoking stops,  the symptoms should not suddenly erupt or markedly worsen.  If so, the differential  diagnosis should include  obesity/deconditioning,  LPR/Reflux/Aspiration syndromes,  occult CHF, or  especially side effect of medications commonly used in this population.    There is very little to support significant copd or need for empirical  Bronchodilators with other likely emplanations for sympotms (see doe) so return with pfts to close the loop at next available slot.     DOE (dyspnea on exertion) Onset 2018 much worse since early 2020 - CTa 08/28/18 neg PE - LHC  09/01/2018  Mid RCA lesion is 30% stenosed.  Prox RCA to Mid RCA lesion is 80% stenosed.  Ost 1st Mrg to 1st Mrg lesion is 30% stenosed.  Ost 1st Diag to 1st Diag lesion is 20% stenosed.  Prox LAD lesion is 20% stenosed.  The left ventricular ejection fraction is 50-55% by visual estimate.  The left ventricular systolic function is normal.  LV end diastolic pressure is mildly elevated = 18  - HRCT 09/29/18 emphysema s air trapping pericardial thickening and nonspecific trace pericardial effusion, somewhat increased compared to prior examination assoc with ESR 53 and no echo done  - 03/14/2019   Walked RA  2 laps @  approx 253f each @ slow pace due to R foot pain  stopped due to  End of study, no sob at sat 94%    Symptoms are markedly disproportionate to objective findings and not clear to what extent this is actually a pulmonary  problem but pt does appear to have difficult to sort out respiratory symptoms of unknown origin for which  DDX  = almost all start with A and  include Adherence, Ace Inhibitors, Acid Reflux, Active Sinus Disease, Alpha 1 Antitripsin deficiency, Anxiety masquerading as Airways dz,  ABPA,  Allergy(esp in young), Aspiration (esp in elderly), Adverse effects of meds,  Active smoking or Vaping, A bunch of PE's/clot burden (a few small clots can't cause this syndrome unless there is already severe underlying pulm or vascular dz with poor reserve),  Anemia or thyroid disorder, plus two Bs  =  Bronchiectasis and Beta blocker use..and one C= CHF     Adherence is always the initial "prime suspect" and is a multilayered concern that requires a "trust but verify" approach in every patient - starting with knowing how to use medications, especially inhalers, correctly, keeping up with refills and understanding the fundamental difference between maintenance and prns vs those medications only taken for a very short course and then stopped and not refilled.  - rec he do med reconciliation at home with our list and call with any discrepancies - return with all meds in hand using a trust but verify approach to confirm accurate Medication  Reconciliation The principal here is that until we are certain that the  patients are doing what we've asked, it makes no sense to ask them to do more.    Adverse effects of meds > none of the usual suspects listed  Alpha one AT def > send screen   Anemia/ thyroid dz > excluded today  ? Anxiety/depression/ deconditioning   > usually at the  bottom of this list of usual suspects but should be  Included pt's based on H and P and note already on psychotropics and may interfere with adherence and also interpretation of response or lack thereof to symptom management which can be quite subjective.   ? A bunch of PE's > cta neg and D dimer nl - while a normal  or high normal value (seen commonly in the elderly or chronically ill)  may miss small peripheral pe, the clot burden with sob is moderately high and the d dimer  has a very high neg pred value if used in this setting.    ? Cardiac > note pericardial findings and high esr would explain the midline "pleuritic cp" which is almost never seen in dz of the visceral or parietal pleura and may need to consider another look with echo to complete the w/u though repeat esr = 11 reassuring.      Total time devoted to counseling  > 50 % of initial 60 min office visit:  Reviewed extensive case hx  with pt/ performed device  teaching  using a teach back technique which also  extended face to face time for this visit (see above) discussion of options/alternatives/ personally creating written customized instructions  in presence of pt  then going over those specific  Instructions directly with the pt including how to use all of the meds but in particular covering each new medication in detail and the difference between the maintenance= "automatic" meds and the prns using an action plan format for the latter (If this problem/symptom => do that organization reading Left to right).  Please see AVS from this visit for a full list of these instructions which I personally wrote for this pt and  are unique to this visit.      Christinia Gully, MD 03/14/2019

## 2019-03-14 NOTE — Telephone Encounter (Signed)
-----  Message from Satira Sark, MD sent at 03/14/2019  3:17 PM EST ----- Thanks Ronalee Belts.  Last time I saw him was before his evaluation for pleuritic chest pain in the hospital back in May.  Leonides Sake saw him at that point and ordered the chest CT.  I am reassured that his ESR is back down to normal, but we can certainly get a follow-up echocardiogram.  I will ask my nurse to schedule an echocardiogram to follow-up pericardial effusion. ----- Message ----- From: Tanda Rockers, MD Sent: 03/14/2019   3:00 PM EST To: Satira Sark, MD  note pericardial findings and high esr would explain the midline "pleuritic cp" which is almost never seen in dz of the visceral or parietal pleura and may need to consider another look with echo to complete the w/u though repeat today's esr = 11 reassuring.  I do not think his cp or sob are pulmonary related based on the fletcher principle    Fletcher curve  basically indicates  if you quit smoking when your best day FEV1 is still well preserved (as was clearly  the case here)  it is highly unlikely you will progress to more severe disease other than what normally occurs with aging

## 2019-03-15 NOTE — Progress Notes (Signed)
Spoke with pt and notified of results per Dr. Wert. Pt verbalized understanding and denied any questions. 

## 2019-03-16 ENCOUNTER — Other Ambulatory Visit: Payer: Self-pay

## 2019-03-16 ENCOUNTER — Ambulatory Visit (HOSPITAL_COMMUNITY)
Admission: RE | Admit: 2019-03-16 | Discharge: 2019-03-16 | Disposition: A | Discharge status: Home / Self Care | Payer: Medicare Other | Source: Ambulatory Visit | Attending: Cardiology | Admitting: Cardiology

## 2019-03-16 DIAGNOSIS — I313 Pericardial effusion (noninflammatory): Secondary | ICD-10-CM | POA: Diagnosis not present

## 2019-03-16 DIAGNOSIS — I3139 Other pericardial effusion (noninflammatory): Secondary | ICD-10-CM

## 2019-03-16 NOTE — Progress Notes (Signed)
*  PRELIMINARY RESULTS* Echocardiogram 2D Echocardiogram has been performed.  Leavy Cella 03/16/2019, 10:00 AM

## 2019-03-18 LAB — ALPHA-1 ANTITRYPSIN PHENOTYPE: A-1 Antitrypsin, Ser: 187 mg/dL (ref 83–199)

## 2019-03-18 LAB — D-DIMER, QUANTITATIVE: D-Dimer, Quant: 0.53 mcg/mL FEU — ABNORMAL HIGH (ref <0.50)

## 2019-03-19 ENCOUNTER — Telehealth: Payer: Self-pay | Admitting: *Deleted

## 2019-03-19 NOTE — Telephone Encounter (Signed)
-----   Message from Satira Sark, MD sent at 03/16/2019  1:31 PM EST ----- Results reviewed.  Follow-up echocardiogram shows normal LVEF.  He did not have any significant pericardial effusion noted.  Continue with current follow-up plan.

## 2019-03-20 ENCOUNTER — Other Ambulatory Visit (HOSPITAL_COMMUNITY): Payer: Medicare Other

## 2019-03-20 NOTE — Telephone Encounter (Signed)
Patient informed. Copy sent to PCP °

## 2019-03-20 NOTE — Telephone Encounter (Signed)
Covid test scheduled for 05/14/19

## 2019-03-20 NOTE — Telephone Encounter (Signed)
Returned call

## 2019-03-29 DIAGNOSIS — D443 Neoplasm of uncertain behavior of pituitary gland: Secondary | ICD-10-CM | POA: Diagnosis not present

## 2019-05-02 ENCOUNTER — Ambulatory Visit (INDEPENDENT_AMBULATORY_CARE_PROVIDER_SITE_OTHER): Payer: Medicare Other | Admitting: Family Medicine

## 2019-05-02 ENCOUNTER — Other Ambulatory Visit: Payer: Self-pay

## 2019-05-02 DIAGNOSIS — I200 Unstable angina: Secondary | ICD-10-CM | POA: Diagnosis not present

## 2019-05-02 DIAGNOSIS — G2581 Restless legs syndrome: Secondary | ICD-10-CM | POA: Diagnosis not present

## 2019-05-02 DIAGNOSIS — I7300 Raynaud's syndrome without gangrene: Secondary | ICD-10-CM | POA: Diagnosis not present

## 2019-05-02 DIAGNOSIS — F4310 Post-traumatic stress disorder, unspecified: Secondary | ICD-10-CM | POA: Diagnosis not present

## 2019-05-02 MED ORDER — AMLODIPINE BESYLATE 2.5 MG PO TABS: ORAL_TABLET | ORAL | 4 refills | Status: DC

## 2019-05-02 NOTE — Progress Notes (Signed)
Subjective:    Patient ID: DESI STACEY, male    DOB: Jul 23, 1940, 78 y.o.   MRN: [id number]  HPI Pt states hand/feet turn white, get cold and numb. Pt states it use to be only when outside but now it is doing the same thing in his house where it is warm. Pt is on new heart med. Pt is having trouble sleeping at night. Hand issue has been going on for several years but has become worse the past year.  Feet have been going on for several months. Pt was told that Tumeric may help.  This patient relates that his hands will go white he also states at times his feet do the same thing he wondered if it could be a circulation issue.  Has various ways of looking at this he does not want to go to a vascular doctor's office currently with the Covid outbreak going on Virtual Visit via Telephone Note  I connected with [name] on 05/02/19 at 10:00 AM EST by telephone and verified that I am speaking with the correct person using two identifiers.  Location: Patient: home Provider: office   I discussed the limitations, risks, security and privacy concerns of performing an evaluation and management service by telephone and the availability of in person appointments. I also discussed with the patient that there may be a patient responsible charge related to this service. The patient expressed understanding and agreed to proceed.   History of Present Illness:    Observations/Objective:   Assessment and Plan:   Follow Up Instructions:    I discussed the assessment and treatment plan with the patient. The patient was provided an opportunity to ask questions and all were answered. The patient agreed with the plan and demonstrated an understanding of the instructions.   The patient was advised to call back or seek an in-person evaluation if the symptoms worsen or if the condition fails to improve as anticipated.  I provided 15 minutes of non-face-to-face time during this encounter.   Vicente Males, LPN    Review of Systems  Constitutional: Negative for diaphoresis and fatigue.  HENT: Negative for congestion and rhinorrhea.   Respiratory: Negative for cough and shortness of breath.   Cardiovascular: Negative for chest pain and leg swelling.  Gastrointestinal: Negative for abdominal pain and diarrhea.  Skin: Negative for color change and rash.  Neurological: Negative for dizziness and headaches.  Psychiatric/Behavioral: Negative for behavioral problems and confusion.   Fall Risk  09/13/2017 09/06/2016 07/02/2015 07/15/2014  Falls in the past year? No No Yes No  Number falls in past yr: - - 1 -  Injury with Fall? - - Yes -  Follow up - - Falls evaluation completed -       Objective:   Physical Exam  Today's visit was via telephone Physical exam was not possible for this visit  Fall Risk  09/13/2017 09/06/2016 07/02/2015 07/15/2014  Falls in the past year? No No Yes No  Number falls in past yr: - - 1 -  Injury with Fall? - - Yes -  Follow up - - Falls evaluation completed -        Assessment & Plan:  1. Raynaud's disease without gangrene Probable rainouts on recommend amlodipine 2.5 mg patient if his blood pressure drops he will let us know hopefully this will help his symptoms he is to give [place] feedback within 2 to 3 weeks how this is doing  2. Restless legs  Restless legs at night he [facility] this could potentially help his restless legs if not we could always use a low-dose nerve medicine I do not think he would be a great candidate for Requip  3. PTSD (post-traumatic stress disorder) Apparently [facility] is [facility] for this and is managing this issue

## 2019-05-14 ENCOUNTER — Other Ambulatory Visit: Payer: Self-pay

## 2019-05-14 ENCOUNTER — Other Ambulatory Visit (HOSPITAL_COMMUNITY)
Admission: RE | Admit: 2019-05-14 | Discharge: 2019-05-14 | Disposition: A | Discharge status: Home / Self Care | Payer: Medicare Other | Source: Ambulatory Visit | Attending: Internal Medicine | Admitting: Internal Medicine

## 2019-05-14 DIAGNOSIS — Z20822 Contact with and (suspected) exposure to covid-19: Secondary | ICD-10-CM | POA: Insufficient documentation

## 2019-05-14 DIAGNOSIS — Z01812 Encounter for preprocedural laboratory examination: Secondary | ICD-10-CM | POA: Diagnosis not present

## 2019-05-15 ENCOUNTER — Other Ambulatory Visit: Payer: Self-pay | Admitting: *Deleted

## 2019-05-15 DIAGNOSIS — R0609 Other forms of dyspnea: Secondary | ICD-10-CM

## 2019-05-15 DIAGNOSIS — J449 Chronic obstructive pulmonary disease, unspecified: Secondary | ICD-10-CM

## 2019-05-15 LAB — SARS CORONAVIRUS 2 (TAT 6-24 HRS): SARS Coronavirus 2: NEGATIVE

## 2019-05-16 ENCOUNTER — Ambulatory Visit (INDEPENDENT_AMBULATORY_CARE_PROVIDER_SITE_OTHER): Payer: Medicare Other | Admitting: Internal Medicine

## 2019-05-16 ENCOUNTER — Other Ambulatory Visit: Payer: Self-pay

## 2019-05-16 ENCOUNTER — Encounter: Payer: Self-pay | Admitting: Internal Medicine

## 2019-05-16 DIAGNOSIS — R0600 Dyspnea, unspecified: Secondary | ICD-10-CM

## 2019-05-16 DIAGNOSIS — J449 Chronic obstructive pulmonary disease, unspecified: Secondary | ICD-10-CM | POA: Diagnosis not present

## 2019-05-16 DIAGNOSIS — R0609 Other forms of dyspnea: Secondary | ICD-10-CM

## 2019-05-16 LAB — PULMONARY FUNCTION TEST
DL/VA % pred: 73 %
DL/VA: 2.95 ml/min/mmHg/L
DLCO unc % pred: 64 %
DLCO unc: 14.74 ml/min/mmHg
FEF 25-75 Post: 0.86 L/sec
FEF 25-75 Pre: 0.7 L/sec
FEF2575-%Change-Post: 22 %
FEF2575-%Pred-Post: 45 %
FEF2575-%Pred-Pre: 37 %
FEV1-%Change-Post: 6 %
FEV1-%Pred-Post: 64 %
FEV1-%Pred-Pre: 60 %
FEV1-Post: 1.7 L
FEV1-Pre: 1.6 L
FEV1FVC-%Change-Post: 0 %
FEV1FVC-%Pred-Pre: 76 %
FEV6-%Change-Post: 7 %
FEV6-%Pred-Post: 87 %
FEV6-%Pred-Pre: 81 %
FEV6-Post: 3 L
FEV6-Pre: 2.8 L
FEV6FVC-%Change-Post: 0 %
FEV6FVC-%Pred-Post: 104 %
FEV6FVC-%Pred-Pre: 104 %
FVC-%Change-Post: 6 %
FVC-%Pred-Post: 83 %
FVC-%Pred-Pre: 78 %
FVC-Post: 3.07 L
FVC-Pre: 2.89 L
Post FEV1/FVC ratio: 55 %
Post FEV6/FVC ratio: 98 %
Pre FEV1/FVC ratio: 55 %
Pre FEV6/FVC Ratio: 97 %
RV % pred: 128 %
RV: 3.09 L
TLC % pred: 92 %
TLC: 5.97 L

## 2019-05-16 MED ORDER — STIOLTO RESPIMAT 2.5-2.5 MCG/ACT IN AERS: 2.0000 | INHALATION_SPRAY | Freq: Every day | RESPIRATORY_TRACT | 0 refills | Status: DC

## 2019-05-16 NOTE — Progress Notes (Signed)
Full PFT performed today. °

## 2019-05-16 NOTE — Progress Notes (Signed)
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 He states that he has been doing some yard work as well as changing some tires on his wife's car without any worsening chest discomfort. However he notes that occasionally when he takes his trash out to the sidewalk he does get short of breath and has intermittent chest discomfort. He states that his chest discomfort has been constant since the evening of 09/27/2018. On the morning of 09/29/2018, the patient felt short of breath and lightheaded with his continued central chest discomfort. As result, he presented for further evaluation. He denied any syncope. He denied any palpitation. The patient had recent heart catheterization on 09/01/2018 which showed an 80% stenosis in the nondominant RCA of 80%. The patient was treated medically. EF was 50 to 55%. The patient was started on metoprolol, Zetia, and Imdur. In the emergency department, the patient was afebrile hemodynamically stable saturating 96% on room air. BMP, CBC, and EKG were unremarkable. D-dimer was negative. CXR showed emphysema.Cardiology was consulted to assist with management.  He was seen for a chest pain rule  out.  He was seen by cardiology who suspected the pain was pleuritic in nature and had low suspicion for cardiac chest pain.  He had high resolution chest CT which showed emphysema and trace pericardial effusion and pericardial thickening.  His symptoms were improved on hospital day 1 and he was discharged with outpatient follow up.  Hospital Course:  Chest pain -Description is pleuritic in nature -High res per cards with emphysemia, pericardial thickening and trace pericardial effusion -Continue aspirin -Continue metoprolol tartrate -Continue Imdur -08/28/2018 CT angiogram chest negative for PE -D-dimer negative on 09/29/2018 -troponin negative  COPD -Patient does not have any formal PFTs -Suspect this may be partly contributing to the patient's dyspnea -He will need formal PFTs after discharge -No wheezing or signs of exacerbation presently  Hyponatremia -Secondary to DDAVP -This has been chronic -Monitor serially - consider dosing in follow up   Hypothyroidism -Continue Synthroid -Check TSH as outpatient          History of Present Illness  03/14/2019  Pulmonary/ 1st office eval/N[facility] Dufresne  Chief Complaint  Patient presents with  . Consult    Consult for SOB. He states he developed SOB back in april that has slwly gotten worse. He states he was told he had abnormal CXR.   Dyspnea:  mb downhill ok about 50yards then one half back and stops with sob and cp  Wife's treadmill x 2 mph x 5 min flat then sob x one  Cough: no  Sleep: no resp symptoms SABA use: none  rec teadmill is ok but use it at 1 mph with goal of gradually  building up to  30 min of walking daily as tolerated Make sure you check your oxygen saturations at highest level of activity to be sure it stays over 90%  Please remember to go to the lab department   for your tests - we will call you with the results when they are available   05/16/2019  f/u ov/Marynell Bies re: GOLD II copd / ex midline cp with known CAD and ?  Pericarditis  Chief Complaint  Patient presents with  . Follow-up    PFT done today. Pt has cough in the am and then at night. He is feeling some increased SOB today.   Dyspnea:  mb downhill then steps half way back usually doe to sob, freq also with cp  Cough: mostly after supper but before bed some dry cough periodically, not on max gerd rx  Sleeping: no resp problems  SABA use: none  02: none    No obvious day to day or daytime variability or assoc excess/ purulent sputum or mucus plugs or hemoptysis  or chest tightness, subjective wheeze or overt sinus or hb symptoms.   Sleeping as above without nocturnal  or early am exacerbation  of respiratory  c/o's or need for noct saba. Also denies any obvious fluctuation of symptoms with weather or environmental changes or other aggravating or alleviating factors except as outlined above   No unusual exposure hx or h/o childhood pna/ asthma or knowledge of premature birth.  Current Allergies, Complete Past Medical History, Past Surgical History, Family History, and Social History were reviewed in [employer] record.  ROS  The following are not active complaints unless bolded Hoarseness, sore throat, dysphagia, dental problems, itching, sneezing,  nasal congestion or discharge of excess mucus or purulent secretions, ear ache,   fever, chills, sweats, unintended wt loss or wt gain, classically pleuritic cp,  orthopnea pnd or arm/hand swelling  or leg swelling, presyncope, palpitations, abdominal pain, anorexia, nausea, vomiting, diarrhea  or change in bowel habits or change in bladder habits, change in stools or change in urine, dysuria, hematuria,  rash, arthralgias, visual complaints, headache, numbness, weakness or ataxia or problems with walking or coordination,  change in mood or  memory.        Current Meds  Medication Sig  . amLODipine (NORVASC) 2.5 MG tablet Take one tablet po daily  . aspirin EC 81 MG tablet Take 81  mg by mouth daily.  . betamethasone dipropionate 0.05 % cream Apply topically 2 (two) times daily.  Marland Kitchen desmopressin (DDAVP) 0.1 MG tablet Take 0.1 mg by mouth 4 (four) times daily as needed (Pt takes 3 to 4 times daily).   Marland Kitchen ezetimibe (ZETIA) 10 MG tablet Take 1 tablet by mouth once daily  . isosorbide mononitrate (IMDUR) 30 MG 24 hr tablet Take 1 tablet (30 mg total) by mouth daily.  Marland Kitchen levothyroxine (SYNTHROID, LEVOTHROID) 88 MCG tablet Take 88 mcg by mouth every other day.  . metoprolol tartrate (LOPRESSOR) 25 MG tablet TAKE 1/2 (ONE-HALF) TABLET BY MOUTH TWICE DAILY -- NEEDS APPOINTMENT FOR FURTHER REFILLS  . nitroGLYCERIN (NITROSTAT) 0.4 MG SL tablet Place 1 tablet (0.4 mg total) under the tongue every 5 (five) minutes as needed for chest pain (Call 911 if still having chest pain after taking 3rd tablet).  Marland Kitchen omeprazole (PRILOSEC) 20 MG capsule TAKE 1 CAPSULE BY MOUTH EVERY DAY  . sodium chloride 1 g tablet Take 1 g by mouth 3 (three) times daily as needed.  Marland Kitchen  triamcinolone cream (KENALOG) 0.1 % Apply 1 application topically 2 (two) times daily.              Past Medical History:  Diagnosis Date  . Arthritis   . Colon polyps    Adenomatous  . Diverticulosis   . Essential hypertension   . GERD (gastroesophageal reflux disease)   . Hypercholesterolemia   . Pituitary microadenoma ([place])    Status post resection 2009  . Prostate cancer ([place])   . Senile purpura ([place]) 10/23/2018       Objective:    Wt Readings from Last 3 Encounters:  05/16/19 146 lb (66.2 kg)  03/14/19 140 lb 3.2 oz (63.6 kg)  01/08/19 144 lb (65.3 kg)     Vital signs reviewed - Note on arrival 02 sats  100% on RA     Somber wm nad   HEENT : pt wearing mask not removed for exam due to covid - 19 concerns.   NECK :  without JVD/Nodes/TM/ nl carotid upstrokes bilaterally   LUNGS: no acc muscle use,  Min barrel  contour chest wall with bilateral  slightly decreased bs s audible wheeze and  without cough on  insp or exp maneuvers and min  Hyperresonant  to  percussion bilaterally     CV:  RRR  no s3 or murmur or increase in P2, and no edema   ABD:  soft and nontender with pos end  insp Hoover's  in the supine position. No bruits or organomegaly appreciated, bowel sounds nl  MS:   Nl gait/  ext warm without deformities, calf tenderness, cyanosis or clubbing No obvious joint restrictions   SKIN: warm and dry without lesions    NEURO:  alert, approp, nl sensorium with  no motor or cerebellar deficits apparent.                                  Assessment

## 2019-05-16 NOTE — Patient Instructions (Signed)
Stiolto 2 puffs first thing in am daily   Omeprazole 20 mg Take 30- 60 min before your first and last meals of the day   GERD (REFLUX)  is an extremely common cause of respiratory symptoms just like yours , many times with no obvious heartburn at all.    It can be treated with medication, but also with lifestyle changes including elevation of the head of your bed (ideally with 6 -8inch blocks under the headboard of your bed),  Smoking cessation, avoidance of late meals, excessive alcohol, and avoid fatty foods, chocolate, peppermint, colas, red wine, and acidic juices such as orange juice.  NO MINT OR MENTHOL PRODUCTS SO NO COUGH DROPS  USE SUGARLESS CANDY INSTEAD (Jolley ranchers or Stover's or Life Savers) or even ice chips will also do - the key is to swallow to prevent all throat clearing. NO OIL BASED VITAMINS - use powdered substitutes.  Avoid fish oil when coughing.  You need to let your heart doctor know about the chest pain when you exert.  Please schedule a follow up office visit in 2  weeks, sooner if needed

## 2019-05-17 ENCOUNTER — Encounter: Payer: Self-pay | Admitting: Internal Medicine

## 2019-05-17 NOTE — Assessment & Plan Note (Addendum)
Quit smoking early 1980s with cough that 100% resolved  - HRCT 09/29/2018 c/w mod emphysema s air trapping or ILD  - PFT's  05/16/2019  FEV1 1.70 (64 % ) ratio 0.55  p 6% improvement from saba p 0 prior to study with DLCO  14.74 (64%) corrects to 2.95 (73%)  for alv volume and FV curve classic concavity   - 05/16/2019  After extensive coaching inhaler device,  effectiveness =    75% try stiolto x 2 weeks - 05/16/2019   Walked RA x two laps =  approx 546ft @ avg pace - stopped due to end of study  with sats of 93% at the end of the study. s cp or sob   MMRC2 = can't walk a nl pace on a flat grade s sob but does fine slow and flat so Pt is Group B in terms of symptom/risk and laba/lama therefore appropriate rx at this point >>>  stiolto or bevespi best choice trial basis and then return in 2 weeks to regroup.  Hoewever, if not better p 2 weeks then unlikely this level of copd is really holding him back from desired activities and he is not prone to aecopd so no need for ICS rx or regular pulmonary f/u

## 2019-05-17 NOTE — Assessment & Plan Note (Signed)
Onset 2018 much worse since early 2020 - CTa 08/28/18 neg PE - LHC  09/01/2018  Mid RCA lesion is 30% stenosed.  Prox RCA to Mid RCA lesion is 80% stenosed.  Ost 1st Mrg to 1st Mrg lesion is 30% stenosed.  Ost 1st Diag to 1st Diag lesion is 20% stenosed.  Prox LAD lesion is 20% stenosed.  The left ventricular ejection fraction is 50-55% by visual estimate.  The left ventricular systolic function is normal.  LV end diastolic pressure is mildly elevated = 18  - HRCT 09/29/18 emphysema s air trapping pericardial thickening and nonspecific trace pericardial effusion, somewhat increased compared to prior examination assoc with ESR 53 and no echo done  - 03/14/2019   Walked RA  2 laps @  approx 236f each @ slow pace due to R foot pain  stopped due to  End of study, no sob at sat 94%   - pfts 05/16/2019 c/w gold II copd > stiolto trial - 05/16/2019   Walked RA x two laps =  approx 5023f@ avg pace - stopped due to end of study  with sats of 93% at the end of the study. s cp or sob   - 05/16/2019 added max gerd rx /diet   His assoc cp has been described in notes as pleuritic in nature but it is neither lateralizing nor pleurtic by my hx and is strickly midline and almost always exertional with ddx stable angina and / or GERD related so rec max rx for gerd and f/u with Cards   Pt informed of the seriousness of COVID 19 infection as a direct risk to lung health  and safey and to close contacts and should continue to wear a facemask in public and minimize exposure to public locations but especially avoid any area or activity where non-close contacts are not observing distancing or wearing an appropriate face mask.  I strongly recommended vaccine when offered.            Each maintenance medication was reviewed in detail including emphasizing most importantly the difference between maintenance and prns and under what circumstances the prns are to be triggered using an action plan format that is not  reflected in the computer generated alphabetically organized AVS which I have not found useful in most complex patients, especially with respiratory illnesses  Total time for H and P, chart review, counseling, teaching device/  directly observeing portions of ambulatory 02 saturation study/  and generating AVS / charting =  40 min

## 2019-05-22 DIAGNOSIS — Z23 Encounter for immunization: Secondary | ICD-10-CM | POA: Diagnosis not present

## 2019-05-30 DIAGNOSIS — C61 Malignant neoplasm of prostate: Secondary | ICD-10-CM | POA: Diagnosis not present

## 2019-06-05 IMAGING — CT CT ABD-PELV W/ CM
2 of 5 series · 16 of 46 positions shown, 18 images · IV contrast (Isovue)
Comparison: None.

CLINICAL DATA: Left groin pain for several months

EXAM:
CT ABDOMEN AND PELVIS WITH CONTRAST
TECHNIQUE: Multidetector CT imaging of the abdomen and pelvis was performed
using the standard protocol following bolus administration of
intravenous contrast.
CONTRAST:  100mL DOI5CM-CLL IOPAMIDOL (DOI5CM-CLL) INJECTION 61%

[Series 2: axial st · axial · 0.69mm/px · z∈[-302,+98]mm · 13 of 90 slices shown, 15 images]
[im 5/90  soft-tissue]
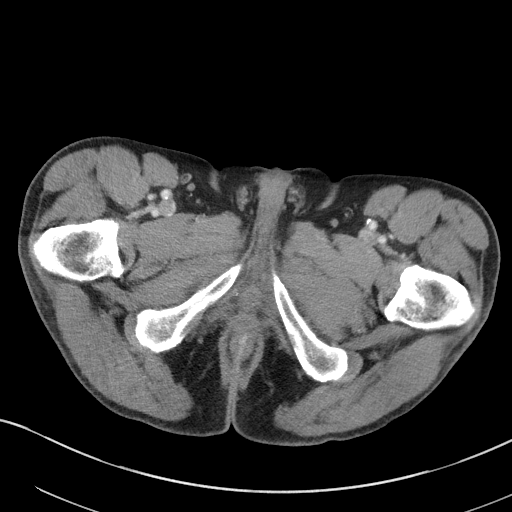
[im 5/90  bone]
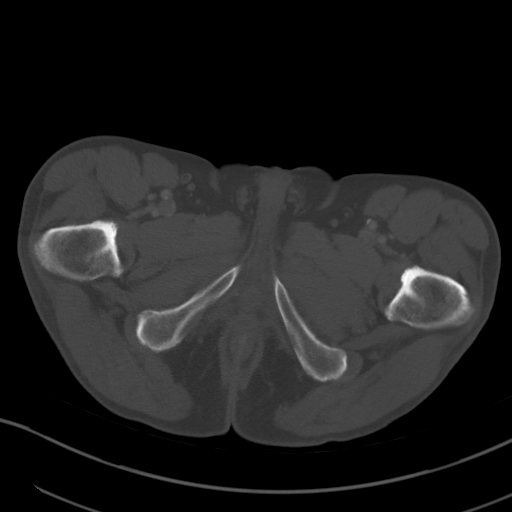
[im 10/90  soft-tissue]
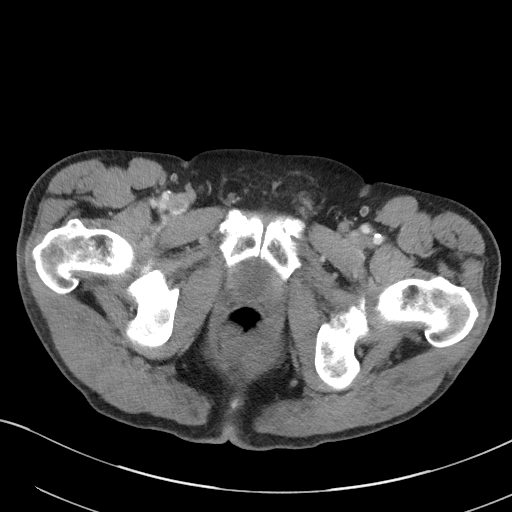
[im 20/90  soft-tissue]
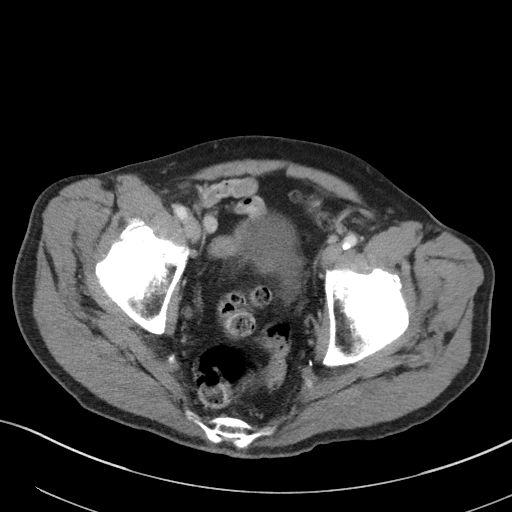
[im 25/90  soft-tissue]
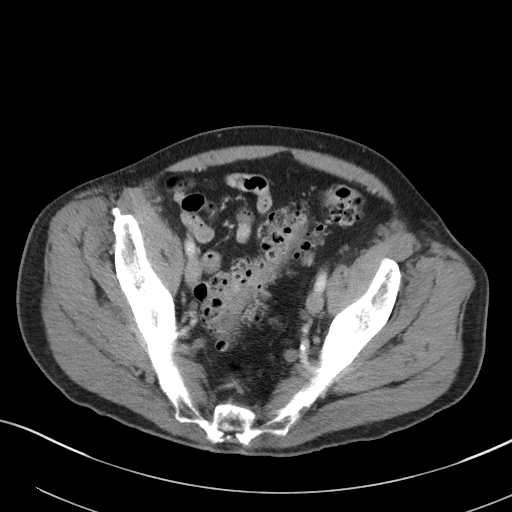
[im 30/90  soft-tissue]
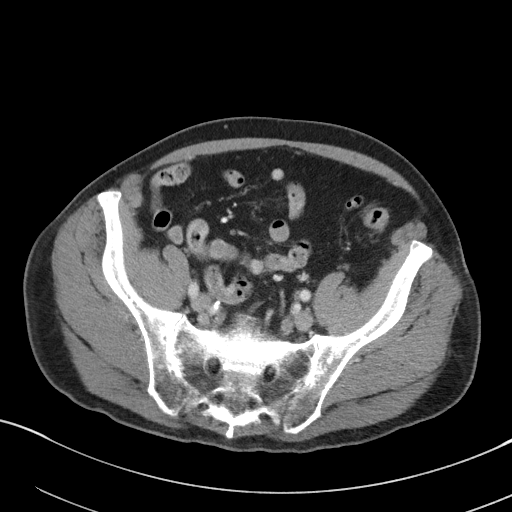
[im 40/90  soft-tissue]
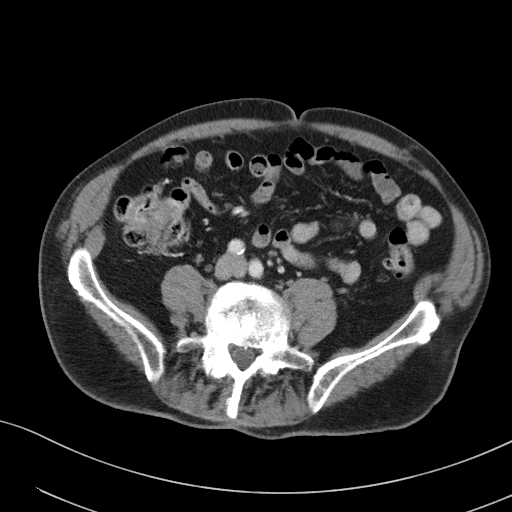
[im 45/90  soft-tissue]
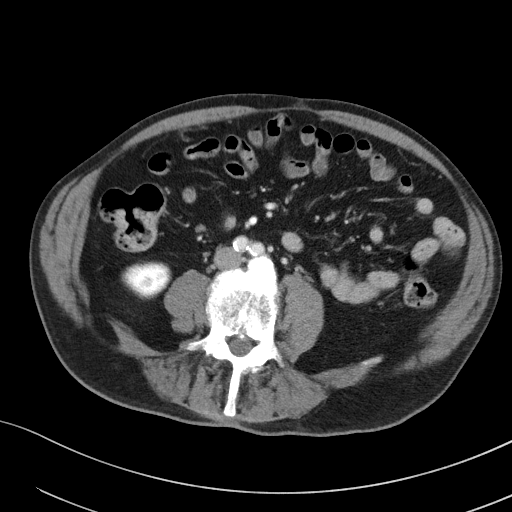
[im 50/90  soft-tissue]
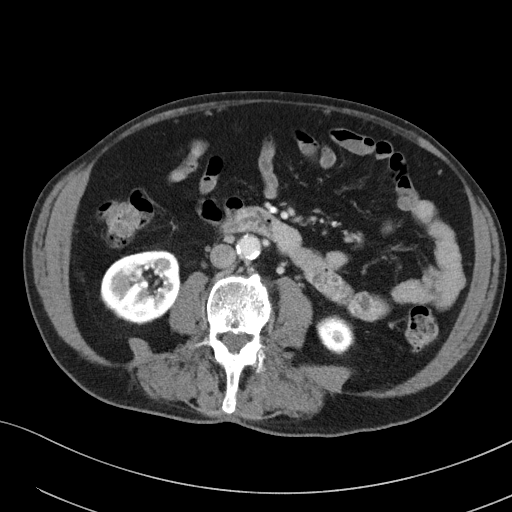
[im 60/90  soft-tissue]
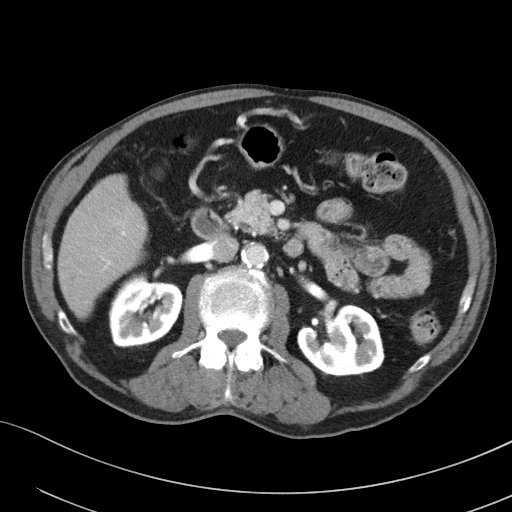
[im 60/90  bone]
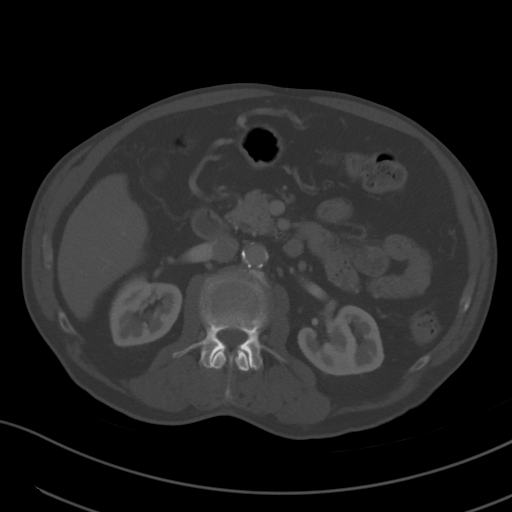
[im 65/90  soft-tissue]
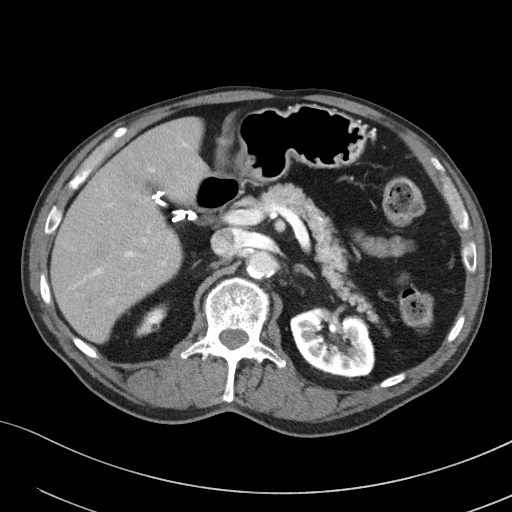
[im 70/90  soft-tissue]
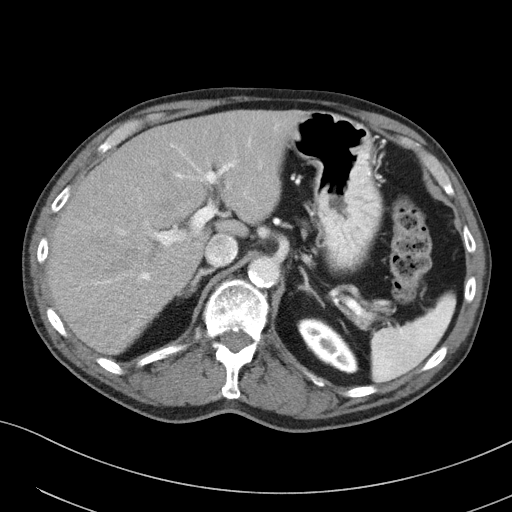
[im 80/90  soft-tissue]
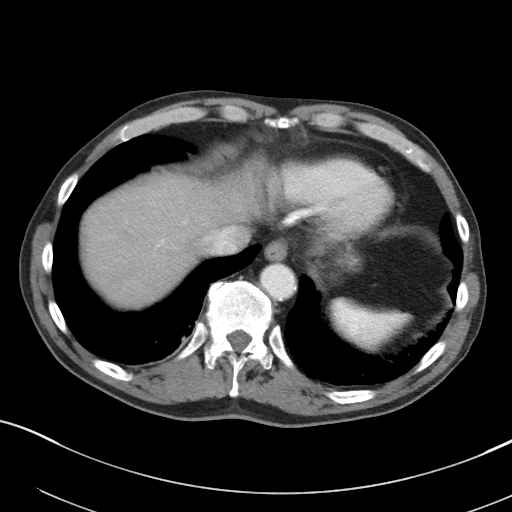
[im 85/90  soft-tissue]
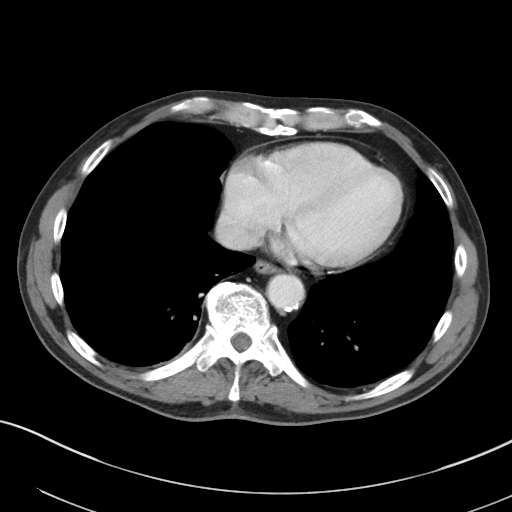

[Series 5: coronal st · coronal · 0.71mm/px · 3 of 92 slices shown]
[im 31/92  soft-tissue]
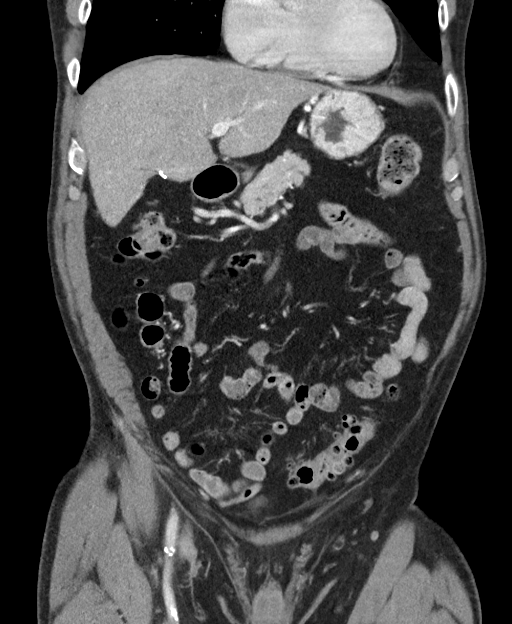
[im 41/92  soft-tissue]
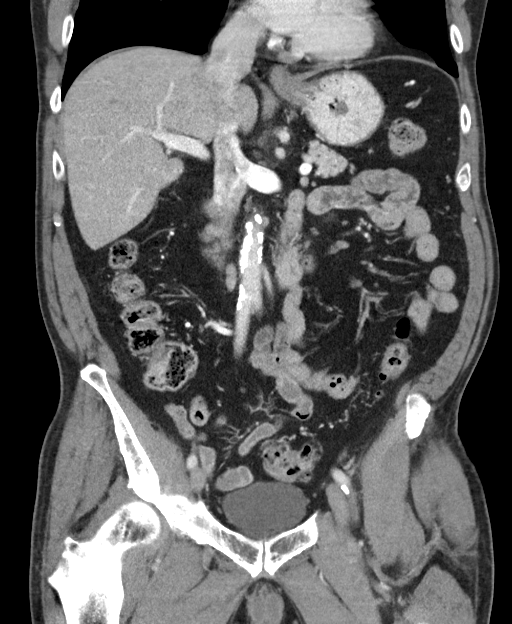
[im 51/92  soft-tissue]
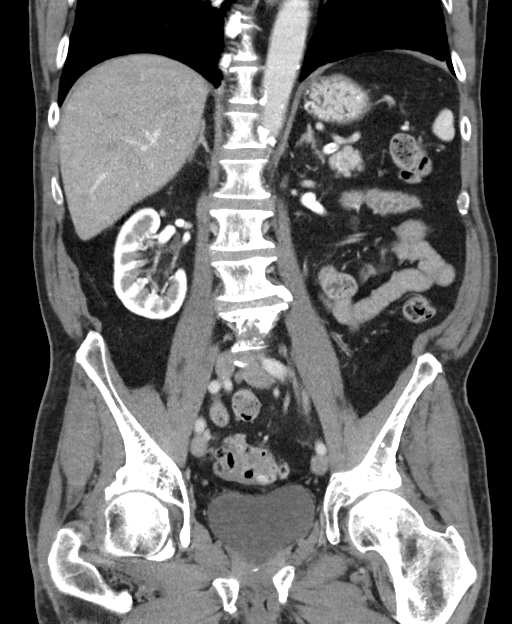

[16 of 46 positions shown; findings below may reference images not displayed]

FINDINGS: Lower chest: No acute abnormality.

Hepatobiliary: No focal liver abnormality is seen. Status post
cholecystectomy. No biliary dilatation.

Pancreas: Unremarkable. No pancreatic ductal dilatation or
surrounding inflammatory changes.

Spleen: Normal in size without focal abnormality.

Adrenals/Urinary Tract: Adrenal glands are unremarkable. Kidneys are
normal, without renal calculi, focal lesion, or hydronephrosis.
Bladder is unremarkable.

Stomach/Bowel: Stomach is within normal limits. Appendix appears
normal. No evidence of bowel wall thickening, distention, or
inflammatory changes. Diverticulosis without evidence of
diverticulitis.

Vascular/Lymphatic: Aortic atherosclerosis. No enlarged abdominal or
pelvic lymph nodes.

Reproductive: Prostate is unremarkable.

Other: No abdominal wall hernia or abnormality. No abdominopelvic
ascites.

Musculoskeletal: No acute or significant osseous findings.
Degenerative disc disease with disc height loss at L2-3, L3-4 L4-5
and S1 with bilateral facet arthropathy. Subchondral sclerosis in
the left femoral head consistent with avascular necrosis without
articular surface collapse.
IMPRESSION: 1. No acute abdominal or pelvic pathology.
2. Diverticulosis without evidence of diverticulitis.
3. Avascular necrosis of the left femoral head without collapse.

## 2019-06-06 DIAGNOSIS — N3946 Mixed incontinence: Secondary | ICD-10-CM | POA: Diagnosis not present

## 2019-06-06 DIAGNOSIS — C61 Malignant neoplasm of prostate: Secondary | ICD-10-CM | POA: Diagnosis not present

## 2019-06-06 DIAGNOSIS — N5201 Erectile dysfunction due to arterial insufficiency: Secondary | ICD-10-CM | POA: Diagnosis not present

## 2019-06-13 ENCOUNTER — Ambulatory Visit (INDEPENDENT_AMBULATORY_CARE_PROVIDER_SITE_OTHER): Payer: Medicare Other | Admitting: Internal Medicine

## 2019-06-13 ENCOUNTER — Encounter: Payer: Self-pay | Admitting: Internal Medicine

## 2019-06-13 ENCOUNTER — Other Ambulatory Visit: Payer: Self-pay

## 2019-06-13 DIAGNOSIS — R0609 Other forms of dyspnea: Secondary | ICD-10-CM

## 2019-06-13 DIAGNOSIS — R0600 Dyspnea, unspecified: Secondary | ICD-10-CM | POA: Diagnosis not present

## 2019-06-13 DIAGNOSIS — J449 Chronic obstructive pulmonary disease, unspecified: Secondary | ICD-10-CM | POA: Diagnosis not present

## 2019-06-13 MED ORDER — STIOLTO RESPIMAT 2.5-2.5 MCG/ACT IN AERS: 2.0000 | INHALATION_SPRAY | Freq: Every day | RESPIRATORY_TRACT | 0 refills | Status: DC

## 2019-06-13 NOTE — Patient Instructions (Addendum)
Continue stiolto 2 pffs every day x 2 weeks and if not convinced it's helping you exert then stop it after two weeks and see if you can tell any differnce  Work on inhaler technique:  relax and gently blow all the way out then take a nice smooth deep breath back in, triggering the inhaler at same time you start breathing in.  Hold for up to 5 seconds if you can.   Rinse and gargle with water when done   Call [facility] for a follow visit regarding your chest discomfort when you walk    If you are satisfied with your treatment plan,  let your doctor know and he/she can either refill your medications or you can return here when your prescription runs out.     If in any way you are not 100% satisfied,  please tell us.  If 100% better, tell your friends!  Pulmonary follow up is as needed

## 2019-06-13 NOTE — Progress Notes (Signed)
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 He states that he has been doing some yard work as well as changing some tires on his wife's car without any worsening chest discomfort. However he notes that occasionally when he takes his trash out to the sidewalk he does get short of breath and has intermittent chest discomfort. He states that his chest discomfort has been constant since the evening of 09/27/2018. On the morning of 09/29/2018, the patient felt short of breath and lightheaded with his continued central chest discomfort. As result, he presented for further evaluation. He denied any syncope. He denied any palpitation. The patient had recent heart catheterization on 09/01/2018 which showed an 80% stenosis in the nondominant RCA of 80%. The patient was treated medically. EF was 50 to 55%. The patient was started on metoprolol, Zetia, and Imdur. In the emergency department, the patient was afebrile hemodynamically stable saturating 96% on room air. BMP, CBC, and EKG were unremarkable. D-dimer was negative. CXR showed emphysema.Cardiology was consulted to assist with management.  He was seen for a chest pain rule out.  He  was seen by cardiology who suspected the pain was pleuritic in nature and had low suspicion for cardiac chest pain.  He had high resolution chest CT which showed emphysema and trace pericardial effusion and pericardial thickening.  His symptoms were improved on hospital day 1 and he was discharged with outpatient follow up.  Hospital Course:  Chest pain -Description is pleuritic in nature -High res per cards with emphysemia, pericardial thickening and trace pericardial effusion -Continue aspirin -Continue metoprolol tartrate -Continue Imdur -08/28/2018 CT angiogram chest negative for PE -D-dimer negative on 09/29/2018 -troponin negative  COPD -Patient does not have any formal PFTs -Suspect this may be partly contributing to the patient's dyspnea -He will need formal PFTs after discharge -No wheezing or signs of exacerbation presently  Hyponatremia -Secondary to DDAVP -This has been chronic -Monitor serially - consider dosing in follow up   Hypothyroidism -Continue Synthroid -Check TSH as outpatient          History of Present Illness  03/14/2019  Pulmonary/ 1st office eval/[name]  Chief Complaint  Patient presents with  . Consult    Consult for SOB. He states he developed SOB back in april that has slwly gotten worse. He states he was told he had abnormal CXR.   Dyspnea:  mb downhill ok about 50 yards then one half back and stops with sob and cp  Wife's treadmill x 2 mph x 5 min flat then sob   Cough: no  Sleep: no resp symptoms SABA use: none  rec teadmill is ok but use it at 1 mph with goal of gradually building up to  30 min  of walking daily as tolerated Make sure you check your oxygen saturations at highest level of activity to be sure it stays over 90%  Please remember to go to the lab department   for your tests - we will call you with the results when they are available   05/16/2019  f/u ov/[name] re: GOLD II copd / ex midline cp with known CAD and ? Pericarditis   Chief Complaint  Patient presents with  . Follow-up    PFT done today. Pt has cough in the am and then at night. He is feeling some increased SOB today.   Dyspnea:  mb downhill then stops half way back usually doe to sob, freq also with cp  Cough: mostly after supper but before bed some dry cough periodically, not on max gerd rx  Sleeping: no resp problems  SABA use: none  02: none  rec Stiolto 2 puffs first thing in am daily  Omeprazole 20 mg Take 30- 60 min before your first and last meals of the day  GERD   You need to let your heart doctor know about the chest pain when you exert. Please schedule a follow up office visit in 2  weeks, sooner if needed    06/13/2019  f/u ov/[name] re: [place] with doe/ ex cp x 2018  Chief Complaint  Patient presents with  . Follow-up    He states he feels that the [place] made it easier for him to breathe but it makes his nose run a little and some cough right after he first inhales medication.   Dyspnea: ? slt improved (really not sure) as  no longer does mailbox and back which is what I suggested he use as indicator.  Now does about 10 minutes on eliptical which is the same as before stiolto  and every time has midline cp and no change in this symptom either s assoc wheeze or variability or rad to arm  Cough:  Only p stiolto Sleeping: able to lie flat s resp symptoms on one pillow  SABA use: none  02: none    No obvious day to day or daytime variability or assoc excess/ purulent sputum or mucus plugs or hemoptysis or   chest tightness, subjective wheeze or overt sinus or hb symptoms.   Sleeping  without nocturnal  or early am exacerbation  of respiratory  c/o's or need for noct saba. Also denies any obvious fluctuation of symptoms with weather or environmental changes or other aggravating or alleviating factors except as outlined above   No unusual exposure hx or h/o childhood pna/ asthma or knowledge of premature birth.  Current Allergies,  Complete Past Medical History, Past Surgical History, Family History, and Social History were reviewed in [employer] record.  ROS  The following are not active complaints unless bolded Hoarseness, sore throat, dysphagia, dental problems, itching, sneezing,  nasal congestion or discharge of excess mucus or purulent secretions, ear ache,   fever, chills, sweats, unintended wt loss or wt gain, classically pleuritic     cp,  Orthopnea resolved on gerd rx  pnd or arm/hand swelling  or leg swelling, presyncope, palpitations, abdominal pain, anorexia, nausea, vomiting, diarrhea  or change in bowel habits or change in bladder habits, change in stools or change in urine, dysuria, hematuria,  rash, arthralgias, visual complaints, headache, numbness, weakness or ataxia or problems with walking or coordination,  change in mood or  memory.  Current Meds  Medication Sig  . amLODipine (NORVASC) 2.5 MG tablet Take one tablet po daily  . aspirin EC 81 MG tablet Take 81 mg by mouth daily.  . betamethasone dipropionate 0.05 % cream Apply topically 2 (two) times daily.  Marland Kitchen desmopressin (DDAVP) 0.1 MG tablet Take 0.1 mg by mouth 4 (four) times daily as needed (Pt takes 3 to 4 times daily).   . DOXEPIN HCL PO Take by mouth as directed.   . ezetimibe (ZETIA) 10 MG tablet Take 1 tablet by mouth once daily  . isosorbide mononitrate (IMDUR) 30 MG 24 hr tablet Take 1 tablet (30 mg total) by mouth daily.  Marland Kitchen levothyroxine (SYNTHROID, LEVOTHROID) 88 MCG tablet Take 88 mcg by mouth every other day.  . metoprolol tartrate (LOPRESSOR) 25 MG tablet TAKE 1/2 (ONE-HALF) TABLET BY MOUTH TWICE DAILY -- NEEDS APPOINTMENT FOR FURTHER REFILLS  . nitroGLYCERIN (NITROSTAT) 0.4 MG SL tablet Place 1 tablet (0.4 mg total) under the tongue every 5 (five) minutes as needed for chest pain (Call 911 if still having chest pain after taking 3rd tablet).  Marland Kitchen omeprazole (PRILOSEC) 20 MG capsule TAKE 1 CAPSULE BY MOUTH EVERY  DAY  . sodium chloride 1 g tablet Take 1 g by mouth 3 (three) times daily as needed.  . Tiotropium Bromide-Olodaterol (STIOLTO RESPIMAT) 2.5-2.5 MCG/ACT AERS Inhale 2 puffs into the lungs daily.  Marland Kitchen triamcinolone cream (KENALOG) 0.1 % Apply 1 application topically 2 (two) times daily.             Past Medical History:  Diagnosis Date  . Arthritis   . Colon polyps    Adenomatous  . Diverticulosis   . Essential hypertension   . GERD (gastroesophageal reflux disease)   . Hypercholesterolemia   . Pituitary microadenoma ([place])    Status post resection 2009  . Prostate cancer ([place])   . Senile purpura ([place]) 10/23/2018       Objective:     06/13/2019        146   05/16/19 146 lb (66.2 kg)  03/14/19 140 lb 3.2 oz (63.6 kg)  01/08/19 144 lb (65.3 kg)      Vital signs reviewed  06/13/2019  - Note at rest 02 sats  96% on RA    Very somber  amb thin wm nad  HEENT : pt wearing mask not removed for exam due to covid - 19 concerns.   NECK :  without JVD/Nodes/TM/ nl carotid upstrokes bilaterally   LUNGS: no acc muscle use,  Min barrel  contour chest wall with bilateral  slightly decreased bs s audible wheeze and  without cough on insp or exp maneuvers and min  Hyperresonant  to  percussion bilaterally     CV:  RRR  no s3 or murmur or increase in P2, and no edema   ABD:  soft and nontender with pos end  insp Hoover's  in the supine position. No bruits or organomegaly appreciated, bowel sounds nl  MS:   Nl gait/  ext warm without deformities, calf tenderness, cyanosis or clubbing No obvious joint restrictions   SKIN: warm and dry without lesions    NEURO:  alert, approp, nl sensorium with  no motor or cerebellar deficits apparent.                                      Assessment

## 2019-06-13 NOTE — Assessment & Plan Note (Signed)
Quit smoking early 1980s with cough that 100% resolved  - HRCT 09/29/2018 c/w mod emphysema s air trapping or ILD  - PFT's  05/16/2019  FEV1 1.70 (64 % ) ratio 0.55  p 6% improvement from saba p 0 prior to study with DLCO  14.74 (64%) corrects to 2.95 (73%)  for alv volume and FV curve classic concavity   - 05/16/2019     try stiolto x 2 weeks ? Better ex tol, no change midline ex cp - 05/16/2019   Walked RA x two laps =  approx 540ft @ avg pace - stopped due to end of study  with sats of 93% at the end of the study s cp or sob  - 06/13/2019  After extensive coaching inhaler device,  effectiveness =    90% with SMI > rx stiolto x one more sample and fill if better ex tol on it that off but needs consistent pattern of ex to detect it  Pt is Group B in terms of symptom/risk and laba/lama therefore appropriate rx at this point >>>  stiolto good choice if helps and covered by insurance with bevespi or anoro other options and main side effect for him = urinary hesitance, which he has not noted.   If no better p stiolto then stop it and pulmonary f/u is prn

## 2019-06-13 NOTE — Assessment & Plan Note (Signed)
Onset 2018 much worse since early 2020 - CTa 08/28/18 neg PE - LHC  09/01/2018  Mid RCA lesion is 30% stenosed.  Prox RCA to Mid RCA lesion is 80% stenosed.  Ost 1st Mrg to 1st Mrg lesion is 30% stenosed.  Ost 1st Diag to 1st Diag lesion is 20% stenosed.  Prox LAD lesion is 20% stenosed.  The left ventricular ejection fraction is 50-55% by visual estimate.  The left ventricular systolic function is normal.  LV end diastolic pressure is mildly elevated = 18  - HRCT 09/29/18 emphysema s air trapping pericardial thickening and nonspecific trace pericardial effusion, somewhat increased compared to prior examination assoc with ESR 53 and no echo done  - 03/14/2019   Walked RA  2 laps @  approx 227f each @ slow pace due to R foot pain  stopped due to  End of study, no sob at sat 94%   - pfts 05/16/2019 c/w gold II copd > stiolto trial - 05/16/2019   Walked RA x two laps =  approx 5051f@ avg pace - stopped due to end of study  with sats of 93% at the end of the study. s cp or sob   - 05/16/2019 added max gerd rx /diet   No more orthopnea or stiolto/ gerd rx but no change pattern of ex cp so rec no change rx but f/u with Dr McDomenic Polites planned later this month, sooner if crescendo pattern.          Each maintenance medication was reviewed in detail including emphasizing most importantly the difference between maintenance and prns and under what circumstances the prns are to be triggered using an action plan format where appropriate.  Total time for H and P, chart review, counseling, teaching device and generating customized AVS unique to this office visit / charting = 30 min

## 2019-06-20 ENCOUNTER — Telehealth: Payer: Self-pay | Admitting: *Deleted

## 2019-06-20 DIAGNOSIS — R609 Edema, unspecified: Secondary | ICD-10-CM

## 2019-06-20 DIAGNOSIS — I200 Unstable angina: Secondary | ICD-10-CM

## 2019-06-20 NOTE — Telephone Encounter (Signed)
Pt voiced understanding - will order limited echo and forward to schedulers

## 2019-06-20 NOTE — Telephone Encounter (Signed)
I do not have enough information to to be certain about the cause of his symptoms.  He has had chest discomfort due to different etiologies.  Some could be related to angina with a small nondominant right coronary artery that does have stenosis.  At his cardiac catheterization in April of last year, Dr. Claiborne Billings felt that medical therapy made the most sense.  We could ultimately try and increase either his Imdur or Norvasc.  On the other hand, he is also had inflammatory etiology chest pain which would not respond to antianginal therapy.  At this point would get a follow-up limited echocardiogram given his leg swelling that is apparently new.  Keep scheduled follow-up, although would present for ER evaluation if his symptoms escalate.

## 2019-06-20 NOTE — Telephone Encounter (Signed)
Pt c/o chest pain when walking for the last few weeks - swelling in both feet for the last few days thinks he has gained a few pounds over the last week - denies any dizziness - went to pulmonology last week (Dr Melvyn Novas) and was told to contact our office for an appt - scheduled for 3/2 in [place] with Dr Domenic Polite and will forward to provider for any further recs

## 2019-06-21 ENCOUNTER — Other Ambulatory Visit: Payer: Self-pay | Admitting: *Deleted

## 2019-06-21 DIAGNOSIS — R0602 Shortness of breath: Secondary | ICD-10-CM

## 2019-06-22 DIAGNOSIS — Z23 Encounter for immunization: Secondary | ICD-10-CM | POA: Diagnosis not present

## 2019-06-28 ENCOUNTER — Ambulatory Visit (HOSPITAL_COMMUNITY): Payer: Medicare Other

## 2019-07-03 ENCOUNTER — Other Ambulatory Visit: Payer: Self-pay

## 2019-07-03 ENCOUNTER — Ambulatory Visit (HOSPITAL_COMMUNITY)
Admission: RE | Admit: 2019-07-03 | Discharge: 2019-07-03 | Disposition: A | Discharge status: Home / Self Care | Payer: Medicare Other | Source: Ambulatory Visit | Attending: Cardiology | Admitting: Cardiology

## 2019-07-03 DIAGNOSIS — R0602 Shortness of breath: Secondary | ICD-10-CM | POA: Insufficient documentation

## 2019-07-03 DIAGNOSIS — R079 Chest pain, unspecified: Secondary | ICD-10-CM | POA: Insufficient documentation

## 2019-07-03 DIAGNOSIS — E785 Hyperlipidemia, unspecified: Secondary | ICD-10-CM | POA: Insufficient documentation

## 2019-07-03 DIAGNOSIS — I10 Essential (primary) hypertension: Secondary | ICD-10-CM | POA: Diagnosis not present

## 2019-07-03 DIAGNOSIS — Z87891 Personal history of nicotine dependence: Secondary | ICD-10-CM | POA: Diagnosis not present

## 2019-07-03 DIAGNOSIS — J449 Chronic obstructive pulmonary disease, unspecified: Secondary | ICD-10-CM | POA: Insufficient documentation

## 2019-07-03 DIAGNOSIS — I351 Nonrheumatic aortic (valve) insufficiency: Secondary | ICD-10-CM | POA: Insufficient documentation

## 2019-07-03 NOTE — Progress Notes (Signed)
*  PRELIMINARY RESULTS* Echocardiogram 2D Echocardiogram LIMITED has been performed.  Leavy Cella 07/03/2019, 3:02 PM

## 2019-07-04 ENCOUNTER — Telehealth: Payer: Self-pay | Admitting: *Deleted

## 2019-07-04 DIAGNOSIS — L570 Actinic keratosis: Secondary | ICD-10-CM | POA: Diagnosis not present

## 2019-07-04 DIAGNOSIS — X32XXXD Exposure to sunlight, subsequent encounter: Secondary | ICD-10-CM | POA: Diagnosis not present

## 2019-07-04 NOTE — Telephone Encounter (Signed)
Patient informed. Copy sent to PCP °

## 2019-07-04 NOTE — Telephone Encounter (Signed)
-----   Message from Erma Heritage, [place] sent at 07/04/2019  8:19 AM EST ----- Covering for Dr. Domenic Polite - Please let the patient know his echocardiogram showed normal pumping function of the heart with a preserved EF of 55 to 60%.  There were no wall motion abnormalities. He did have trivial leakage along the mitral valve but no significant valve abnormalities. Overall, a reassuring study. Will be reviewed in detail at his upcoming appointment. Please forward a copy of results to [name], MD.

## 2019-07-10 ENCOUNTER — Ambulatory Visit: Payer: Medicare Other | Admitting: Cardiology

## 2019-07-11 ENCOUNTER — Encounter: Payer: Self-pay | Admitting: Cardiology

## 2019-07-11 NOTE — Progress Notes (Signed)
Cardiology Office Note  Date: 07/12/2019   ID: [name], DOB 1940-08-04, MRN [id number]  PCP:  [name], MD  Cardiologist:  Rozann Lesches, MD Electrophysiologist:  None   Chief Complaint  Patient presents with  . Cardiac follow-up    History of Present Illness: [name] is a 79 y.o. male last seen in April 2020.  He presents for a follow-up visit.  Still reports exertional chest discomfort despite overall reassuring findings a cardiac catheterization last year.  He does have a small nondominant RCA with significant stenosis, but risk of intervention would be increased, and medical therapy has been pursued so far.  He has had interval follow-up with Dr. Melvyn Novas as well.  There have also been concerns about possible inflammatory causes of his chest pain.  He has had some ankle swelling in the last several weeks.  Recent follow-up echocardiogram in February showed LVEF 55 to 60% without regional wall motion abnormalities, no major valvular abnormalities.  This could be related to the Norvasc that he is currently taking.  I reviewed the remainder of his medications, we discussed moving Imdur to a morning dose to see if this is helpful, he is otherwise on a reasonable regimen from the perspective of ischemia.  Ranexa would be another thought.  He is also reporting bilateral leg discomfort, claudication not excluded.  Past Medical History:  Diagnosis Date  . Arthritis   . CAD (coronary artery disease)    Nonobstructive CAD with the exception of a nondominant RCA that was managed medically in April 2020.  Marland Kitchen Colon polyps    Adenomatous  . Diverticulosis   . Essential hypertension   . GERD (gastroesophageal reflux disease)   . Hypercholesterolemia   . Pituitary microadenoma ([employer])    Status post resection 2009  . Prostate cancer ([place])   . Senile purpura ([place]) 10/23/2018    Past Surgical History:  Procedure Laterality Date  . BACK SURGERY  1991  . CHOLECYSTECTOMY   08/2007  . LEFT HEART CATH AND CORONARY ANGIOGRAPHY N/A 09/01/2018   Procedure: LEFT HEART CATH AND CORONARY ANGIOGRAPHY;  Surgeon: Troy Sine, MD;  Location: [facility];  Service: Cardiovascular;  Laterality: N/A;  . PROSTATE SURGERY    . Removal of pituitary tumor  9/09    Current Outpatient Medications  Medication Sig Dispense Refill  . amLODipine (NORVASC) 2.5 MG tablet Take one tablet po daily 30 tablet 4  . aspirin EC 81 MG tablet Take 81 mg by mouth daily.    . betamethasone dipropionate 0.05 % cream Apply topically 2 (two) times daily.    Marland Kitchen desmopressin (DDAVP) 0.1 MG tablet Take 0.1 mg by mouth 4 (four) times daily as needed (Pt takes 3 to 4 times daily).     . DOXEPIN HCL PO Take by mouth as directed.     . ezetimibe (ZETIA) 10 MG tablet Take 1 tablet by mouth once daily 30 tablet 0  . isosorbide mononitrate (IMDUR) 30 MG 24 hr tablet Take 1 tablet (30 mg total) by mouth in the morning. 30 tablet 3  . levothyroxine (SYNTHROID, LEVOTHROID) 88 MCG tablet Take 88 mcg by mouth every other day.    . metoprolol tartrate (LOPRESSOR) 25 MG tablet TAKE 1/2 (ONE-HALF) TABLET BY MOUTH TWICE DAILY -- NEEDS APPOINTMENT FOR FURTHER REFILLS 30 tablet 0  . nitroGLYCERIN (NITROSTAT) 0.4 MG SL tablet Place 1 tablet (0.4 mg total) under the tongue every 5 (five) minutes as needed  for chest pain (Call 911 if still having chest pain after taking 3rd tablet). 50 tablet 0  . omeprazole (PRILOSEC) 20 MG capsule TAKE 1 CAPSULE BY MOUTH EVERY DAY 90 capsule 1  . sodium chloride 1 g tablet Take 1 g by mouth 3 (three) times daily as needed.    . Tiotropium Bromide-Olodaterol (STIOLTO RESPIMAT) 2.5-2.5 MCG/ACT AERS Inhale 2 puffs into the lungs daily. 4 g 0  . triamcinolone cream (KENALOG) 0.1 % Apply 1 application topically 2 (two) times daily.     No current facility-administered medications for this visit.   Allergies:  Codeine, Losartan, Morphine, Amlodipine, Statins, and Tramadol   ROS:   No  palpitations or syncope.  Physical Exam: VS:  BP 124/66   Pulse 60   Temp 97.9 F (36.6 C)   Ht 5\' 7"  (1.702 m)   Wt 147 lb (66.7 kg)   SpO2 96%   BMI 23.02 kg/m , BMI Body mass index is 23.02 kg/m.  Wt Readings from Last 3 Encounters:  07/12/19 147 lb (66.7 kg)  06/13/19 146 lb (66.2 kg)  05/16/19 146 lb (66.2 kg)    General: Patient appears comfortable at rest. HEENT: Conjunctiva and lids normal, wearing a mask. Neck: Supple, no elevated JVP or carotid bruits, no thyromegaly. Lungs: Clear to auscultation, nonlabored breathing at rest. Cardiac: Regular rate and rhythm, no S3 or significant systolic murmur. Abdomen: Soft, nontender, bowel sounds present. Extremities: No pitting edema, distal pulses 1-2+.  ECG:  An ECG dated 09/29/2018 was personally reviewed today and demonstrated:  Sinus rhythm with prolonged PR interval.  Recent Labwork: 03/14/2019: BUN 14; Creatinine, Ser 0.99; Hemoglobin 15.3; Platelets 182.0; Potassium 4.7; Pro B Natriuretic peptide (BNP) 49.0; Sodium 134; TSH 2.36     Component Value Date/Time   CHOL 207 (H) 03/17/2018 0823   TRIG 117 03/17/2018 0823   HDL 48 03/17/2018 0823   CHOLHDL 4.3 03/17/2018 0823   CHOLHDL 5.1 07/16/2013 0938   VLDL 22 07/16/2013 0938   LDLCALC 136 (H) 03/17/2018 0823    Other Studies Reviewed Today:  Cardiac catheterization 09/01/2018:  Mid RCA lesion is 30% stenosed.  Prox RCA to Mid RCA lesion is 80% stenosed.  Ost 1st Mrg to 1st Mrg lesion is 30% stenosed.  Ost 1st Diag to 1st Diag lesion is 20% stenosed.  Prox LAD lesion is 20% stenosed.  The left ventricular ejection fraction is 50-55% by visual estimate.  The left ventricular systolic function is normal.  LV end diastolic pressure is mildly elevated.   Low normal LV function with ejection fraction estimated at 50 to 55%.  LVEDP 18 mm.  Non-obstructive CAD involving the LAD, diagonal, and dominant left circumflex coronary artery with 20 and 30%  narrowings.  Nondominant RCA with shepherd's crook takeoff and 80% stenosis on a bend in the in the proximal to mid vessel and 30% mid stenosis.  RECOMMENDATION: Since the patient was not on any anti-ischemic medication nor lipid-lowering therapy, since his RCA is a nondominant vessel and small caliber recommend an initial attempt at aggressive medical therapy for his CAD.  If medical therapy fails, then consider PCI to his 80% RCA lesion.  At present, will add low-dose nitrates, beta-blocker therapy, and with his reported statin intolerance, initiate Zetia.  However patient should be considered for PCSK9 inhibition or consider rechallenge with low-dose statin.  Also consider possible addition of amlodipine for anti-ischemic medical therapy in addition to hypertension control with ideal blood pressure less than 120/80 and LDL <  70.  Echocardiogram 07/03/2019: 1. Left ventricular ejection fraction, by estimation, is 55 to 60%. The  left ventricle has normal function. The left ventricle has no regional  wall motion abnormalities. Left ventricular diastolic parameters were  normal.  2. Right ventricular systolic function is normal. The right ventricular  size is normal.  3. The mitral valve is grossly normal. Trivial mitral valve  regurgitation.  4. The aortic valve is tricuspid. Aortic valve regurgitation is mild. No  aortic stenosis is present.  5. The inferior vena cava is normal in size with <50% respiratory  variability, suggesting right atrial pressure of 8 mmHg.   Assessment and Plan:  1.  Intermittent exertional chest pain, possibly multifactorial based on work-up over the last year, but he does have a small nondominant RCA with significant stenosis that could be resulting in angina symptoms.  Medical therapy makes sense at this point given vessel size.  We will move Imdur 30 mg to a morning dose from the evening, otherwise continue aspirin, Norvasc, Lopressor, and Zetia.  Ranexa  would be another consideration.  2.  Bilateral leg pain, rule out claudication.  We will obtain lower extremity arterial Dopplers and ABIs.  3.  Essential hypertension, blood pressure is adequately controlled today.  Medication Adjustments/Labs and Tests Ordered: Current medicines are reviewed at length with the patient today.  Concerns regarding medicines are outlined above.   Tests Ordered: Orders Placed This Encounter  Procedures  . VAS [place] LOWER EXTREMITY ARTERIAL DUPLEX    Medication Changes: Meds ordered this encounter  Medications  . isosorbide mononitrate (IMDUR) 30 MG 24 hr tablet    Sig: Take 1 tablet (30 mg total) by mouth in the morning.    Dispense:  30 tablet    Refill:  3    Disposition:  Follow up 3 months in the [place] office with [place].  Signed, Satira Sark, MD, [facility] 07/12/2019 9:48 AM    [facility] [place]. [address], [place], [place] [address] Phone: [phone]; Fax: [phone]

## 2019-07-12 ENCOUNTER — Ambulatory Visit (INDEPENDENT_AMBULATORY_CARE_PROVIDER_SITE_OTHER): Payer: Medicare Other | Admitting: Cardiology

## 2019-07-12 ENCOUNTER — Other Ambulatory Visit: Payer: Self-pay

## 2019-07-12 ENCOUNTER — Encounter: Payer: Self-pay | Admitting: Cardiology

## 2019-07-12 VITALS — BP 124/66 | HR 60 | Temp 97.9°F | Ht 67.0 in | Wt 147.0 lb

## 2019-07-12 DIAGNOSIS — E782 Mixed hyperlipidemia: Secondary | ICD-10-CM | POA: Diagnosis not present

## 2019-07-12 DIAGNOSIS — I10 Essential (primary) hypertension: Secondary | ICD-10-CM

## 2019-07-12 DIAGNOSIS — I200 Unstable angina: Secondary | ICD-10-CM | POA: Diagnosis not present

## 2019-07-12 DIAGNOSIS — I25119 Atherosclerotic heart disease of native coronary artery with unspecified angina pectoris: Secondary | ICD-10-CM | POA: Diagnosis not present

## 2019-07-12 DIAGNOSIS — M79604 Pain in right leg: Secondary | ICD-10-CM

## 2019-07-12 DIAGNOSIS — M79605 Pain in left leg: Secondary | ICD-10-CM

## 2019-07-12 MED ORDER — ISOSORBIDE MONONITRATE ER 30 MG PO TB24: 30.0000 mg | ORAL_TABLET | Freq: Every morning | ORAL | 3 refills | Status: DC

## 2019-07-12 NOTE — Patient Instructions (Signed)
Medication Instructions:  Take your Imdur in the morning.  *If you need a refill on your cardiac medications before your next appointment, please call your pharmacy*   Lab Work: None  If you have labs (blood work) drawn today and your tests are completely normal, you will receive your results only by: Marland Kitchen MyChart Message (if you have MyChart) OR . A paper copy in the mail If you have any lab test that is abnormal or we need to change your treatment, we will call you to review the results.   Testing/Procedures: Your physician has requested that you have a lower  extremity arterial duplex in the [facility] office. This test is an ultrasound of the arteries in the legs . It looks at arterial blood flow in the legs. Allow one hour for Lower  Arterial scans. There are no restrictions or special instructions    Follow-Up: At [facility], you and your health needs are our priority.  As part of our continuing mission to provide you with exceptional heart care, we have created designated Provider Care Teams.  These Care Teams include your primary Cardiologist (physician) and Advanced Practice Providers (APPs -  Physician Assistants and Nurse Practitioners) who all work together to provide you with the care you need, when you need it.  We recommend signing up for the patient portal called "MyChart".  Sign up information is provided on this After Visit Summary.  MyChart is used to connect with patients for Virtual Visits (Telemedicine).  Patients are able to view lab/test results, encounter notes, upcoming appointments, etc.  Non-urgent messages can be sent to your provider as well.   To learn more about what you can do with MyChart, go to [email or web address].    Your next appointment:   3 month(s)  The format for your next appointment:   In Person  Provider:   Bernerd Pho, PA-C   Other Instructions None      Thank you for choosing [facility]  !

## 2019-07-18 ENCOUNTER — Other Ambulatory Visit: Payer: Self-pay | Admitting: Cardiology

## 2019-07-18 DIAGNOSIS — I739 Peripheral vascular disease, unspecified: Secondary | ICD-10-CM

## 2019-07-25 DIAGNOSIS — Z961 Presence of intraocular lens: Secondary | ICD-10-CM | POA: Diagnosis not present

## 2019-07-31 ENCOUNTER — Other Ambulatory Visit: Payer: Self-pay

## 2019-07-31 ENCOUNTER — Ambulatory Visit (INDEPENDENT_AMBULATORY_CARE_PROVIDER_SITE_OTHER): Payer: Medicare Other | Admitting: Family Medicine

## 2019-07-31 ENCOUNTER — Encounter: Payer: Self-pay | Admitting: Family Medicine

## 2019-07-31 VITALS — BP 110/68 | Temp 97.5°F | Ht 67.0 in | Wt 148.0 lb

## 2019-07-31 DIAGNOSIS — M2042 Other hammer toe(s) (acquired), left foot: Secondary | ICD-10-CM

## 2019-07-31 DIAGNOSIS — I200 Unstable angina: Secondary | ICD-10-CM | POA: Diagnosis not present

## 2019-07-31 DIAGNOSIS — M720 Palmar fascial fibromatosis [Dupuytren]: Secondary | ICD-10-CM | POA: Diagnosis not present

## 2019-07-31 DIAGNOSIS — J449 Chronic obstructive pulmonary disease, unspecified: Secondary | ICD-10-CM | POA: Diagnosis not present

## 2019-07-31 DIAGNOSIS — M2041 Other hammer toe(s) (acquired), right foot: Secondary | ICD-10-CM | POA: Diagnosis not present

## 2019-07-31 DIAGNOSIS — M79671 Pain in right foot: Secondary | ICD-10-CM

## 2019-07-31 DIAGNOSIS — R252 Cramp and spasm: Secondary | ICD-10-CM | POA: Diagnosis not present

## 2019-07-31 MED ORDER — STIOLTO RESPIMAT 2.5-2.5 MCG/ACT IN AERS: 2.0000 | INHALATION_SPRAY | Freq: Every day | RESPIRATORY_TRACT | 0 refills | Status: DC

## 2019-07-31 NOTE — Progress Notes (Signed)
   Subjective:    Patient ID: [name], male    DOB: Feb 22, 1941, 79 y.o.   MRN: [id number]  HPI pain in both feet for years but getting worse. Right foot is the worse one. Does not take anything for pain.   Fingers are drawing up on both hands. Started years ago but getting worse.  Pt wants to know if he can get a refill on stiolto respimat. Prescribed by dr wert in February. Needs refill.   Review of Systems     Objective:   Physical Exam Lungs clear respiratory rate normal heart regular no murmurs Dupuytren contracture noted on the hands Hammertoes noted on the feet Extremities no edema  30 minutes spent with patient discussing multiple issues plus review of chart and chart preparation plus also documentation lab ordering medication ordering    Assessment & Plan:  1. Right foot pain Hammertoes referral to podiatry patient help intervene  2. Chronic obstructive pulmonary disease, unspecified COPD type ([place]) COPD needs a refill on his inhaler he will check in on the price and talk with his insurance company and see if there are cheaper alternatives Medication given 3. Muscle cramp Muscle cramps could be related to electrolytes he has a history of low sodium check metabolic panel and magnesium - Basic metabolic panel - Magnesium Stretching exercises recommended await lab work 4. Dupuytren contracture Contracture referral to hand surgery for further evaluation - Ambulatory referral to Hand Surgery  5. Hammer toes of both feet Referral to podiatry as discussed - Ambulatory referral to Podiatry

## 2019-08-01 LAB — BASIC METABOLIC PANEL
BUN/Creatinine Ratio: 13 (ref 10–24)
BUN: 12 mg/dL (ref 8–27)
CO2: 24 mmol/L (ref 20–29)
Calcium: 9.8 mg/dL (ref 8.6–10.2)
Chloride: 93 mmol/L — ABNORMAL LOW (ref 96–106)
Creatinine, Ser: 0.96 mg/dL (ref 0.76–1.27)
GFR calc Af Amer: 88 mL/min/{1.73_m2} (ref >59)
GFR calc non Af Amer: 76 mL/min/{1.73_m2} (ref >59)
Glucose: 83 mg/dL (ref 65–99)
Potassium: 4.8 mmol/L (ref 3.5–5.2)
Sodium: 130 mmol/L — ABNORMAL LOW (ref 134–144)

## 2019-08-01 LAB — MAGNESIUM: Magnesium: 2 mg/dL (ref 1.6–2.3)

## 2019-08-06 ENCOUNTER — Encounter: Payer: Self-pay | Admitting: Family Medicine

## 2019-08-08 DIAGNOSIS — M180 Bilateral primary osteoarthritis of first carpometacarpal joints: Secondary | ICD-10-CM | POA: Diagnosis not present

## 2019-08-08 DIAGNOSIS — M720 Palmar fascial fibromatosis [Dupuytren]: Secondary | ICD-10-CM | POA: Diagnosis not present

## 2019-08-15 ENCOUNTER — Other Ambulatory Visit: Payer: Self-pay | Admitting: Family Medicine

## 2019-08-15 ENCOUNTER — Other Ambulatory Visit: Payer: Self-pay

## 2019-08-15 ENCOUNTER — Ambulatory Visit (INDEPENDENT_AMBULATORY_CARE_PROVIDER_SITE_OTHER): Payer: Medicare Other

## 2019-08-15 ENCOUNTER — Telehealth: Payer: Self-pay | Admitting: Family Medicine

## 2019-08-15 ENCOUNTER — Encounter: Payer: Self-pay | Admitting: Family Medicine

## 2019-08-15 DIAGNOSIS — I739 Peripheral vascular disease, unspecified: Secondary | ICD-10-CM

## 2019-08-15 MED ORDER — STIOLTO RESPIMAT 2.5-2.5 MCG/ACT IN AERS: 2.0000 | INHALATION_SPRAY | Freq: Every day | RESPIRATORY_TRACT | 0 refills | Status: AC

## 2019-08-15 NOTE — Telephone Encounter (Signed)
I dictated a letter for this patient I also printed a prescription that I will sign Please forward the letter along with the prescription to the patient then he can forward it to the [place] to help get his medication as he requested  More than likely the patient will want to pick this up so therefore offered to have him pick it up or we can mail it to him his choice thanks

## 2019-08-15 NOTE — Telephone Encounter (Signed)
Letter and rx at nurse station. Left message to return call to see if pt wants to pickup or mail.

## 2019-08-16 NOTE — Telephone Encounter (Signed)
Pt notified and he will pick up today

## 2019-08-20 ENCOUNTER — Telehealth: Payer: Self-pay

## 2019-08-20 DIAGNOSIS — I739 Peripheral vascular disease, unspecified: Secondary | ICD-10-CM

## 2019-08-20 NOTE — Telephone Encounter (Signed)
-----   Message from Satira Sark, MD sent at 08/20/2019  8:18 AM EDT ----- Results reviewed.  Lower extremity arterial Dopplers are consistent with PAD, left greater than right.  Please refer him to Dr. Gwenlyn Found for vascular consultation.

## 2019-08-20 NOTE — Telephone Encounter (Signed)
Patient notified of test results and that a referral was placed to see Dr.Berry

## 2019-08-24 ENCOUNTER — Ambulatory Visit (INDEPENDENT_AMBULATORY_CARE_PROVIDER_SITE_OTHER): Payer: Medicare Other

## 2019-08-24 ENCOUNTER — Ambulatory Visit (INDEPENDENT_AMBULATORY_CARE_PROVIDER_SITE_OTHER): Payer: Medicare Other | Admitting: Podiatry

## 2019-08-24 ENCOUNTER — Other Ambulatory Visit: Payer: Self-pay

## 2019-08-24 VITALS — Temp 97.8°F

## 2019-08-24 DIAGNOSIS — G5761 Lesion of plantar nerve, right lower limb: Secondary | ICD-10-CM

## 2019-08-24 DIAGNOSIS — M2041 Other hammer toe(s) (acquired), right foot: Secondary | ICD-10-CM | POA: Diagnosis not present

## 2019-09-11 ENCOUNTER — Encounter: Payer: Self-pay | Admitting: Cardiovascular Disease

## 2019-09-11 ENCOUNTER — Other Ambulatory Visit: Payer: Self-pay

## 2019-09-11 ENCOUNTER — Ambulatory Visit (INDEPENDENT_AMBULATORY_CARE_PROVIDER_SITE_OTHER): Payer: Medicare Other | Admitting: Cardiovascular Disease

## 2019-09-11 VITALS — BP 120/62 | HR 54 | Ht 67.0 in | Wt 142.6 lb

## 2019-09-11 DIAGNOSIS — I739 Peripheral vascular disease, unspecified: Secondary | ICD-10-CM

## 2019-09-11 NOTE — Patient Instructions (Signed)
Medication Instructions:  NO CHANGE *If you need a refill on your cardiac medications before your next appointment, please call your pharmacy*   Lab Work: If you have labs (blood work) drawn today and your tests are completely normal, you will receive your results only by: . MyChart Message (if you have MyChart) OR . A paper copy in the mail If you have any lab test that is abnormal or we need to change your treatment, we will call you to review the results.   Follow-Up: At [facility], you and your health needs are our priority.  As part of our continuing mission to provide you with exceptional heart care, we have created designated Provider Care Teams.  These Care Teams include your primary Cardiologist (physician) and Advanced Practice Providers (APPs -  Physician Assistants and Nurse Practitioners) who all work together to provide you with the care you need, when you need it.  We recommend signing up for the patient portal called "MyChart".  Sign up information is provided on this After Visit Summary.  MyChart is used to connect with patients for Virtual Visits (Telemedicine).  Patients are able to view lab/test results, encounter notes, upcoming appointments, etc.  Non-urgent messages can be sent to your provider as well.   To learn more about what you can do with MyChart, go to [email or web address].    Your next appointment:    AS NEEDED 

## 2019-09-11 NOTE — Assessment & Plan Note (Signed)
Mr. [name] was referred to me by Dr. Domenic Polite for evaluation of PAD.  He does have risk factors including remote tobacco abuse, hypertension hyperlipidemia.  He does have minimal CAD by cath a year ago.  He complains of pain principally at night when getting into bed which sounds more like restless leg syndrome.  He also complains of "cold feet but really denies significant claudication symptoms.  He had Dopplers performed 08/15/2019 that showed a right ABI of 1.04 and a left of 0.95.  He did have a high-frequency signal in his left tibioperoneal trunk.  I do not think this is contributing to any current symptoms and have reassured Mr. Matsuyama.  No further work-up is necessary at this time.

## 2019-09-11 NOTE — Progress Notes (Signed)
09/11/2019 [name]   August 29, 1940  [id number]  Primary Physician Lance Drown, MD Primary Cardiologist: Lance Harp MD [name]  HPI:  [name] is a 79 y.o. thin appearing married Caucasian male father 2, grandfather of 4 grandchildren referred by Dr. Domenic Garrison for evaluation of PAD.  He is retired from running an Academic librarian transmission shop.  He lives in [place].  His cardiovascular risk factor profile is notable for remote tobacco abuse although he does have COPD, treated hypertension hyperlipidemia.  Is never had a heart attack or stroke.  He denies chest pain or shortness of breath.  Did have a cardiac catheterization performed 4/20 that showed a small nondominant RCA with significant stenosis but otherwise noncritical CAD with normal LV function.  He does complain of some pain in his legs at night when he gets in the bed which sounds more like restless leg syndrome.  Does not really have claudication symptoms.  He had Doppler studies performed 08/15/2019 revealing a right ABI of 1.04 and a left of 0.95 with a left tibioperoneal trunk lesion.   Current Meds  Medication Sig  . amLODipine (NORVASC) 2.5 MG tablet Take one tablet po daily  . aspirin EC 81 MG tablet Take 81 mg by mouth daily.  . clobetasol cream (TEMOVATE) [id number] % Apply 1 application topically 2 (two) times daily.  [name] desmopressin (DDAVP) 0.1 MG tablet Take 0.1 mg by mouth 4 (four) times daily as needed (Pt takes 3 to 4 times daily).   . DOXEPIN HCL PO Take by mouth as directed.   . ezetimibe (ZETIA) 10 MG tablet Take 1 tablet by mouth once daily  . isosorbide mononitrate (IMDUR) 30 MG 24 hr tablet Take 1 tablet (30 mg total) by mouth in the morning.  [name] levothyroxine (SYNTHROID, LEVOTHROID) 88 MCG tablet Take 88 mcg by mouth every other day.  . metoprolol tartrate (LOPRESSOR) 25 MG tablet TAKE 1/2 (ONE-HALF) TABLET BY MOUTH TWICE DAILY -- NEEDS APPOINTMENT FOR FURTHER REFILLS  . mometasone (ELOCON) 0.1  % cream Apply 1 application topically daily.  . nitroGLYCERIN (NITROSTAT) 0.4 MG SL tablet Place 1 tablet (0.4 mg total) under the tongue every 5 (five) minutes as needed for chest pain (Call 911 if still having chest pain after taking 3rd tablet).  [name] omeprazole (PRILOSEC) 20 MG capsule TAKE 1 CAPSULE BY MOUTH EVERY DAY  . sodium chloride 1 g tablet Take 1 g by mouth 3 (three) times daily as needed.  . Tiotropium Bromide-Olodaterol (STIOLTO RESPIMAT) 2.5-2.5 MCG/ACT AERS Inhale 2 puffs into the lungs daily.     Allergies  Allergen Reactions  . Codeine   . Losartan     Patient had elevated potassium and also slight elevation of creatinine-October 2017  . Morphine   . Amlodipine     Complained of neck pain with medication-November 2017 not allergic  . Other Other (See Comments)    Body aches intolerant to statins  . Statins Other (See Comments)    Body aches intolerant to statins  . Tramadol     Sweat, light-headed    Social History   Socioeconomic History  . Marital status: Married    Spouse name: Not on file  . Number of children: 2  . Years of education: Not on file  . Highest education level: Not on file  Occupational History  . Occupation: Retired  Tobacco Use  . Smoking status: Former Smoker    Types: Cigarettes  .  Smokeless tobacco: Former Systems developer    Quit date: 11/03/1977  . Tobacco comment: quit smoking in early 80's  Substance and Sexual Activity  . Alcohol use: Yes    Alcohol/week: 0.0 standard drinks    Comment: one drink a day per pt  . Drug use: Not Currently  . Sexual activity: Not on file  Other Topics Concern  . Not on file  Social History Narrative   Married with 2 children   Right handed   12+   2-3 cups daily   Social Determinants of Health   Financial Resource Strain:   . Difficulty of Paying Living Expenses:   Food Insecurity:   . Worried About Charity fundraiser in the Last Year:   . Arboriculturist in the Last Year:   Transportation Needs:    . Film/video editor (Medical):   [name] Lack of Transportation (Non-Medical):   Physical Activity:   . Days of Exercise per Week:   . Minutes of Exercise per Session:   Stress:   . Feeling of Stress :   Social Connections:   . Frequency of Communication with Friends and Family:   . Frequency of Social Gatherings with Friends and Family:   . Attends Religious Services:   . Active Member of Clubs or Organizations:   . Attends Archivist Meetings:   [name] Marital Status:   Intimate Partner Violence:   . Fear of Current or Ex-Partner:   . Emotionally Abused:   [name] Physically Abused:   . Sexually Abused:      Review of Systems: General: negative for chills, fever, night sweats or weight changes.  Cardiovascular: negative for chest pain, dyspnea on exertion, edema, orthopnea, palpitations, paroxysmal nocturnal dyspnea or shortness of breath Dermatological: negative for rash Respiratory: negative for cough or wheezing Urologic: negative for hematuria Abdominal: negative for nausea, vomiting, diarrhea, bright red blood per rectum, melena, or hematemesis Neurologic: negative for visual changes, syncope, or dizziness All other systems reviewed and are otherwise negative except as noted above.    Blood pressure 120/62, pulse (!) 54, height 5\' 7"  (1.702 m), weight 142 lb 9.6 oz (64.7 kg).  General appearance: alert and no distress Neck: no adenopathy, no JVD, supple, symmetrical, trachea midline, thyroid not enlarged, symmetric, no tenderness/mass/nodules and Right carotid bruit Lungs: clear to auscultation bilaterally Heart: regular rate and rhythm, S1, S2 normal, no murmur, click, rub or gallop Extremities: extremities normal, atraumatic, no cyanosis or edema Pulses: 2+ and symmetric Skin: Skin color, texture, turgor normal. No rashes or lesions Neurologic: Alert and oriented X 3, normal strength and tone. Normal symmetric reflexes. Normal coordination and gait  EKG sinus  bradycardia at 54 without ST or T wave changes.  I personally reviewed this EKG.  ASSESSMENT AND PLAN:   Peripheral arterial disease [facility]) Mr. Phong was referred to me by Dr. Domenic Garrison for evaluation of PAD.  He does have risk factors including remote tobacco abuse, hypertension hyperlipidemia.  He does have minimal CAD by cath a year ago.  He complains of pain principally at night when getting into bed which sounds more like restless leg syndrome.  He also complains of "cold feet but really denies significant claudication symptoms.  He had Dopplers performed 08/15/2019 that showed a right ABI of 1.04 and a left of 0.95.  He did have a high-frequency signal in his left tibioperoneal trunk.  I do not think this is contributing to any current symptoms and have reassured Mr.  Sancho.  No further work-up is necessary at this time.      Lance Harp MD FACP,FACC,FAHA, [facility] 09/11/2019 1:47 PM

## 2019-09-14 ENCOUNTER — Ambulatory Visit (INDEPENDENT_AMBULATORY_CARE_PROVIDER_SITE_OTHER): Payer: Medicare Other | Admitting: Podiatry

## 2019-09-14 DIAGNOSIS — Z5329 Procedure and treatment not carried out because of patient's decision for other reasons: Secondary | ICD-10-CM

## 2019-09-14 NOTE — Progress Notes (Signed)
No show for appt. 

## 2019-10-16 ENCOUNTER — Ambulatory Visit (INDEPENDENT_AMBULATORY_CARE_PROVIDER_SITE_OTHER): Payer: Medicare Other | Admitting: Student

## 2019-10-16 ENCOUNTER — Encounter: Payer: Self-pay | Admitting: Student

## 2019-10-16 ENCOUNTER — Other Ambulatory Visit: Payer: Self-pay

## 2019-10-16 VITALS — BP 116/60 | HR 70 | Ht 67.0 in | Wt 142.2 lb

## 2019-10-16 DIAGNOSIS — I2510 Atherosclerotic heart disease of native coronary artery without angina pectoris: Secondary | ICD-10-CM | POA: Diagnosis not present

## 2019-10-16 DIAGNOSIS — I200 Unstable angina: Secondary | ICD-10-CM

## 2019-10-16 DIAGNOSIS — E785 Hyperlipidemia, unspecified: Secondary | ICD-10-CM

## 2019-10-16 DIAGNOSIS — I739 Peripheral vascular disease, unspecified: Secondary | ICD-10-CM | POA: Diagnosis not present

## 2019-10-16 DIAGNOSIS — I10 Essential (primary) hypertension: Secondary | ICD-10-CM | POA: Diagnosis not present

## 2019-10-16 MED ORDER — PRAVASTATIN SODIUM 10 MG PO TABS: 10.0000 mg | ORAL_TABLET | Freq: Every day | ORAL | 3 refills | Status: DC

## 2019-10-16 NOTE — Progress Notes (Signed)
Cardiology Office Note    Date:  10/16/2019   ID:  [name], DOB 04/27/1941, MRN [id number]  PCP:  [name], MD  Cardiologist: Rozann Lesches, MD    Chief Complaint  Patient presents with  . Follow-up    3 month visit    History of Present Illness:    [name] is a 79 y.o. male with past medical history of CAD (s/p catheterization in 08/2018 showing nonobstructive CAD along the LAD and LCx with 80% stenosis along a small nondominant RCA with medical management recommended), HTN, HLD (stain intolerant) and GERD who presents to the office today for 72-month follow-up.   He was last examined by Dr. Domenic Polite in 07/2019 and reported lower extremity edema and recent echocardiogram the prior month had been reassuring and showed a preserved EF of 55-60% with no regional WMA. He continued to have episodes of chest discomfort and it was recommended he try taking Imdur 30mg  in the AM and this could be further titrated in the future or Ranexa could be added. He did report bilateral leg pain concerning for claudication, therefore lower extremity ABI's and dopplers were obtained. This showed evidence of PAD along his left lower extremity and he was referred to Dr. Gwenlyn Found for further evaluation.   He did meet with Dr. Gwenlyn Found and his episodes of leg pain were typically occuing at night and symptoms were felt to be most consistent with RLS and not true claudication. Given his right ABI was 1.04 and left was 0.95, it was felt he did not require further work-up of his PAD.   In talking with the patient today, he reports his episodes of chest discomfort have mostly resolved since the time of his last visit. He still has some discomfort along his legs which is typically most notable at night but denies any specific claudication. No recent orthopnea, PND or lower extremity edema.  In reviewing his statin history, he reports being intolerant to Atorvastatin in the past and was previously on  Pravastatin and tolerated this well but he is unsure of why it was discontinued or if he self discontinued the medication.  Past Medical History:  Diagnosis Date  . Arthritis   . CAD (coronary artery disease)    Nonobstructive CAD with the exception of a nondominant RCA that was managed medically in April 2020.  Marland Kitchen Colon polyps    Adenomatous  . Diverticulosis   . Essential hypertension   . GERD (gastroesophageal reflux disease)   . Hypercholesterolemia   . Pituitary microadenoma ([place])    Status post resection 2009  . Prostate cancer ([place])   . Senile purpura ([place]) 10/23/2018    Past Surgical History:  Procedure Laterality Date  . BACK SURGERY  1991  . CHOLECYSTECTOMY  08/2007  . LEFT HEART CATH AND CORONARY ANGIOGRAPHY N/A 09/01/2018   Procedure: LEFT HEART CATH AND CORONARY ANGIOGRAPHY;  Surgeon: Troy Sine, MD;  Location: [facility];  Service: Cardiovascular;  Laterality: N/A;  . PROSTATE SURGERY    . Removal of pituitary tumor  9/09    Current Medications: Outpatient Medications Prior to Visit  Medication Sig Dispense Refill  . amLODipine (NORVASC) 2.5 MG tablet Take one tablet po daily 30 tablet 4  . aspirin EC 81 MG tablet Take 81 mg by mouth daily.    . clobetasol cream (TEMOVATE) 9.50 % Apply 1 application topically 2 (two) times daily.    Marland Kitchen desmopressin (DDAVP) 0.1 MG tablet Take  0.1 mg by mouth 4 (four) times daily as needed (Pt takes 3 to 4 times daily).     . DOXEPIN HCL PO Take by mouth as directed.     . ezetimibe (ZETIA) 10 MG tablet Take 1 tablet by mouth once daily 30 tablet 0  . isosorbide mononitrate (IMDUR) 30 MG 24 hr tablet Take 1 tablet (30 mg total) by mouth in the morning. 30 tablet 3  . levothyroxine (SYNTHROID, LEVOTHROID) 88 MCG tablet Take 88 mcg by mouth every other day.    . metoprolol tartrate (LOPRESSOR) 25 MG tablet TAKE 1/2 (ONE-HALF) TABLET BY MOUTH TWICE DAILY -- NEEDS APPOINTMENT FOR FURTHER REFILLS 30 tablet 0  . mometasone  (ELOCON) 0.1 % cream Apply 1 application topically daily.    . nitroGLYCERIN (NITROSTAT) 0.4 MG SL tablet Place 1 tablet (0.4 mg total) under the tongue every 5 (five) minutes as needed for chest pain (Call 911 if still having chest pain after taking 3rd tablet). 50 tablet 0  . omeprazole (PRILOSEC) 20 MG capsule TAKE 1 CAPSULE BY MOUTH EVERY DAY 90 capsule 1  . sodium chloride 1 g tablet Take 1 g by mouth 3 (three) times daily as needed.    . Tiotropium Bromide-Olodaterol (STIOLTO RESPIMAT) 2.5-2.5 MCG/ACT AERS Inhale 2 puffs into the lungs daily. 4 g 0   No facility-administered medications prior to visit.     Allergies:   Codeine, Losartan, Morphine, Amlodipine, Other, Statins, and Tramadol   Social History   Socioeconomic History  . Marital status: Married    Spouse name: Not on file  . Number of children: 2  . Years of education: Not on file  . Highest education level: Not on file  Occupational History  . Occupation: Retired  Tobacco Use  . Smoking status: Former Smoker    Types: Cigarettes  . Smokeless tobacco: Former Systems developer    Quit date: 11/03/1977  . Tobacco comment: quit smoking in early 80's  Substance and Sexual Activity  . Alcohol use: Yes    Alcohol/week: 0.0 standard drinks    Comment: one drink a day per pt  . Drug use: Not Currently  . Sexual activity: Not on file  Other Topics Concern  . Not on file  Social History Narrative   Married with 2 children   Right handed   12+   2-3 cups daily   Social Determinants of Health   Financial Resource Strain:   . Difficulty of Paying Living Expenses:   Food Insecurity:   . Worried About Charity fundraiser in the Last Year:   . Arboriculturist in the Last Year:   Transportation Needs:   . Film/video editor (Medical):   Marland Kitchen Lack of Transportation (Non-Medical):   Physical Activity:   . Days of Exercise per Week:   . Minutes of Exercise per Session:   Stress:   . Feeling of Stress :   Social Connections:     . Frequency of Communication with Friends and Family:   . Frequency of Social Gatherings with Friends and Family:   . Attends Religious Services:   . Active Member of Clubs or Organizations:   . Attends Archivist Meetings:   Marland Kitchen Marital Status:      Family History:  The patient's family history includes Clotting disorder in his father; Heart attack in his father; Heart disease in his father.   Review of Systems:   Please see the history of present illness.  General:  No chills, fever, night sweats or weight changes.  Positive for bilateral leg pain (improved).  Cardiovascular:  No chest pain, dyspnea on exertion, edema, orthopnea, palpitations, paroxysmal nocturnal dyspnea. Dermatological: No rash, lesions/masses Respiratory: No cough, dyspnea Urologic: No hematuria, dysuria Abdominal:   No nausea, vomiting, diarrhea, bright red blood per rectum, melena, or hematemesis Neurologic:  No visual changes, wkns, changes in mental status. All other systems reviewed and are otherwise negative except as noted above.   Physical Exam:    VS:  BP 116/60   Pulse 70   Ht 5\' 7"  (1.702 m)   Wt 142 lb 3.2 oz (64.5 kg)   SpO2 96%   BMI 22.27 kg/m    General: Well developed, well nourished,male appearing in no acute distress. Head: Normocephalic, atraumatic, sclera non-icteric.  Neck: No carotid bruits. JVD not elevated.  Lungs: Respirations regular and unlabored, without wheezes or rales.  Heart: Regular rate and rhythm. No S3 or S4.  No murmur, no rubs, or gallops appreciated. Abdomen: Soft, non-tender, non-distended. No obvious abdominal masses. Msk:  Strength and tone appear normal for age. No obvious joint deformities or effusions. Extremities: No clubbing or cyanosis. No lower extremity edema.  Distal pedal pulses are 2+ bilaterally. Neuro: Alert and oriented X 3. Moves all extremities spontaneously. No focal deficits noted. Psych:  Responds to questions appropriately with a  normal affect. Skin: No rashes or lesions noted  Wt Readings from Last 3 Encounters:  10/16/19 142 lb 3.2 oz (64.5 kg)  09/11/19 142 lb 9.6 oz (64.7 kg)  07/31/19 148 lb (67.1 kg)     Studies/Labs Reviewed:   EKG:  EKG is not ordered today.    Recent Labs: 03/14/2019: Hemoglobin 15.3; Platelets 182.0; Pro B Natriuretic peptide (BNP) 49.0; TSH 2.36 07/31/2019: BUN 12; Creatinine, Ser 0.96; Magnesium 2.0; Potassium 4.8; Sodium 130   Lipid Panel    Component Value Date/Time   CHOL 207 (H) 03/17/2018 0823   TRIG 117 03/17/2018 0823   HDL 48 03/17/2018 0823   CHOLHDL 4.3 03/17/2018 0823   CHOLHDL 5.1 07/16/2013 0938   VLDL 22 07/16/2013 0938   LDLCALC 136 (H) 03/17/2018 0823    Additional studies/ records that were reviewed today include:   Cardiac Catheterization: 08/2018  Mid RCA lesion is 30% stenosed.  Prox RCA to Mid RCA lesion is 80% stenosed.  Ost 1st Mrg to 1st Mrg lesion is 30% stenosed.  Ost 1st Diag to 1st Diag lesion is 20% stenosed.  Prox LAD lesion is 20% stenosed.  The left ventricular ejection fraction is 50-55% by visual estimate.  The left ventricular systolic function is normal.  LV end diastolic pressure is mildly elevated.   Low normal LV function with ejection fraction estimated at 50 to 55%.  LVEDP 18 mm.  Non-obstructive CAD involving the LAD, diagonal, and dominant left circumflex coronary artery with 20 and 30% narrowings.  Nondominant RCA with shepherd's crook takeoff and 80% stenosis on a bend in the in the proximal to mid vessel and 30% mid stenosis.  RECOMMENDATION: Since the patient was not on any anti-ischemic medication nor lipid-lowering therapy, since his RCA is a nondominant vessel and small caliber recommend an initial attempt at aggressive medical therapy for his CAD.  If medical therapy fails, then consider PCI to his 80% RCA lesion.  At present, will add low-dose nitrates, beta-blocker therapy, and with his reported statin  intolerance, initiate Zetia.  However patient should be considered for PCSK9 inhibition or consider  rechallenge with low-dose statin.  Also consider possible addition of amlodipine for anti-ischemic medical therapy in addition to hypertension control with ideal blood pressure less than 120/80 and LDL < 70.  Echocardiogram: 06/2019 IMPRESSIONS    1. Left ventricular ejection fraction, by estimation, is 55 to 60%. The  left ventricle has normal function. The left ventricle has no regional  wall motion abnormalities. Left ventricular diastolic parameters were  normal.  2. Right ventricular systolic function is normal. The right ventricular  size is normal.  3. The mitral valve is grossly normal. Trivial mitral valve  regurgitation.  4. The aortic valve is tricuspid. Aortic valve regurgitation is mild. No  aortic stenosis is present.  5. The inferior vena cava is normal in size with <50% respiratory  variability, suggesting right atrial pressure of 8 mmHg.   Lower Extremity Arterial Studies: 08/2019 Summary:  Right: 30-49% stenosis noted in the superficial femoral artery.  Atherosclerosis in the CFA, SFA and popliteal arteries, with at least  30-49% distal SFA stenosis.   Left: 50-74% stenosis noted in the superficial femoral artery. 50-74%  stenosis noted in the posterior tibial artery. Atherosclerosis in the CFA,  SFA, popliteal, TP trunk and proximal PTA and peroneal arteries.  At least 50-75% stenosis in the SFA, distal TP trunk and PTA (tandem  stenoses).   Vascular consult recommended.   ABI's: 08/2019 Summary:  Right: Resting right ankle-brachial index is within normal range. No  evidence of significant right lower extremity arterial disease. The right  toe-brachial index is normal.   Left: Resting left ankle-brachial index is within normal range. No  evidence of significant left lower extremity arterial disease. The left  toe-brachial index is normal.   Assessment:     1. Coronary artery disease involving native coronary artery of native heart without angina pectoris   2. Peripheral arterial disease ([place])   3. Essential hypertension   4. Hyperlipidemia LDL goal <70      Plan:   In order of problems listed above:  1. CAD - He is s/p catheterization in 08/2018 showing nonobstructive CAD along the LAD and LCx with 80% stenosis along a small nondominant RCA with medical management recommended.  - He reports his episodes of chest pain have improved since his last visit.  - Continue ASA 81mg  daily, Zetia 10mg  daily, Lopressor 12.5mg  BID and Imdur 30mg  daily. Will rechallenge with statin therapy as outlined below.   2. PAD - Recent lower extremity dopplers showed evidence of PAD along his left lower extremity. He was referred to Dr. Gwenlyn Found and given his right ABI was 1.04 and left was 0.95 in the setting of atypical claudication symptoms, it was felt he did not require further work-up of his PAD. Continue with risk factor modification.   3. HTN - BP is well-controlled at 116/60 during today's visit. Continue current medication regimen with Amlodipine 2.5mg  daily, Imdur 30mg  daily and Lopressor 12.5mg  BID.   4. HLD - LDL was elevated to 136 in 03/2018 with goal LDL less than 70 with known CAD. He remains on Zetia 10mg  daily and was previously intolerant to Crestor and Lipitor. Was on Pravastatin and tolerated well but he is unsure why this was discontinued. Given his CAD and PAD, I recommended we retry Pravastatin 10mg  daily. Will recheck FLP and LFT's in 6-8 weeks.    Medication Adjustments/Labs and Tests Ordered: Current medicines are reviewed at length with the patient today.  Concerns regarding medicines are outlined above.  Medication changes, Labs  and Tests ordered today are listed in the Patient Instructions below. Patient Instructions  Medication Instructions:  Your physician has recommended you make the following change in your medication:    Pravastatin 10 mg Daily  *If you need a refill on your cardiac medications before your next appointment, please call your pharmacy*   Lab Work: Your physician recommends that you return for lab work in: 2 Months ( 12/17/19)   If you have labs (blood work) drawn today and your tests are completely normal, you will receive your results only by: Marland Kitchen MyChart Message (if you have MyChart) OR . A paper copy in the mail If you have any lab test that is abnormal or we need to change your treatment, we will call you to review the results.   Testing/Procedures: NONE    Follow-Up: At [facility], you and your health needs are our priority.  As part of our continuing mission to provide you with exceptional heart care, we have created designated Provider Care Teams.  These Care Teams include your primary Cardiologist (physician) and Advanced Practice Providers (APPs -  Physician Assistants and Nurse Practitioners) who all work together to provide you with the care you need, when you need it.  We recommend signing up for the patient portal called "MyChart".  Sign up information is provided on this After Visit Summary.  MyChart is used to connect with patients for Virtual Visits (Telemedicine).  Patients are able to view lab/test results, encounter notes, upcoming appointments, etc.  Non-urgent messages can be sent to your provider as well.   To learn more about what you can do with MyChart, go to [email or web address].    Your next appointment:   6 month(s)  The format for your next appointment:   In Person  Provider:   Rozann Lesches, MD   Other Instructions Thank you for choosing [facility]!       Signed, Erma Heritage, PA-C  10/16/2019 7:24 PM    [facility]. [address] [place], [place] [address] Phone: [phone] Fax: [phone]

## 2019-10-16 NOTE — Patient Instructions (Signed)
Medication Instructions:  Your physician has recommended you make the following change in your medication:  Pravastatin 10 mg Daily  *If you need a refill on your cardiac medications before your next appointment, please call your pharmacy*   Lab Work: Your physician recommends that you return for lab work in: 2 Months ( 12/17/19)   If you have labs (blood work) drawn today and your tests are completely normal, you will receive your results only by: Marland Kitchen MyChart Message (if you have MyChart) OR . A paper copy in the mail If you have any lab test that is abnormal or we need to change your treatment, we will call you to review the results.   Testing/Procedures: NONE    Follow-Up: At [facility], you and your health needs are our priority.  As part of our continuing mission to provide you with exceptional heart care, we have created designated Provider Care Teams.  These Care Teams include your primary Cardiologist (physician) and Advanced Practice Providers (APPs -  Physician Assistants and Nurse Practitioners) who all work together to provide you with the care you need, when you need it.  We recommend signing up for the patient portal called "MyChart".  Sign up information is provided on this After Visit Summary.  MyChart is used to connect with patients for Virtual Visits (Telemedicine).  Patients are able to view lab/test results, encounter notes, upcoming appointments, etc.  Non-urgent messages can be sent to your provider as well.   To learn more about what you can do with MyChart, go to [email or web address].    Your next appointment:   6 month(s)  The format for your next appointment:   In Person  Provider:   Rozann Lesches, MD   Other Instructions Thank you for choosing [facility]!

## 2019-10-22 NOTE — Progress Notes (Signed)
  Subjective:  Patient ID: [name], male    DOB: 1940/11/24,  MRN: [id number]  Chief Complaint  Patient presents with  . Foot Problem    R foot, 2nd-5th toes. Pt stated, "I have hammertoes. Sometimes it feels like my foot is going to break due to the pain (toes to plantar midfoot) - 10/10. I also have constant numbness".  Marland Kitchen PAD    "My cardiologist just diagnosed me with PAD. I am going to see Dr. Gwenlyn Found next week".    79 y.o. male presents with the above complaint. History confirmed with patient.   Objective:  Physical Exam: dependent rubor present, no trophic changes or ulcerative lesions and normal sensory exam. Left Foot: normal exam, no swelling, tenderness, instability; ligaments intact, full range of motion of all ankle/foot joints  Right Foot: hammertoe deformities noted, POP 3rd interspace right   No images are attached to the encounter.  Radiographs: X-ray of the right foot: no fracture, dislocation, swelling or degenerative changes noted Assessment:   1. Hammertoe of right foot   2. Neuroma, Morton's, right      Plan:  Patient was evaluated and treated and all questions answered.  Morton Neuroma -Educated on etiology -Educated on padding and proper shoegear -XR reviewed with patient -Injection delivered to the affected interspaces  Procedure: Neuroma Injection Location: Right 3rd interspace Skin Prep: Alcohol. Injectate: 0.5 cc 0.5% marcaine plain, 0.5 cc celestone. Disposition: Patient tolerated procedure well. Injection site dressed with a band-aid.  No follow-ups on file.

## 2019-11-05 ENCOUNTER — Ambulatory Visit: Payer: Medicare Other | Admitting: Cardiology

## 2019-12-31 DIAGNOSIS — D443 Neoplasm of uncertain behavior of pituitary gland: Secondary | ICD-10-CM | POA: Diagnosis not present

## 2019-12-31 DIAGNOSIS — E032 Hypothyroidism due to medicaments and other exogenous substances: Secondary | ICD-10-CM | POA: Diagnosis not present

## 2020-01-29 DIAGNOSIS — X32XXXD Exposure to sunlight, subsequent encounter: Secondary | ICD-10-CM | POA: Diagnosis not present

## 2020-01-29 DIAGNOSIS — L570 Actinic keratosis: Secondary | ICD-10-CM | POA: Diagnosis not present

## 2020-01-29 DIAGNOSIS — L308 Other specified dermatitis: Secondary | ICD-10-CM | POA: Diagnosis not present

## 2020-02-01 DIAGNOSIS — E232 Diabetes insipidus: Secondary | ICD-10-CM | POA: Diagnosis not present

## 2020-02-01 DIAGNOSIS — E032 Hypothyroidism due to medicaments and other exogenous substances: Secondary | ICD-10-CM | POA: Diagnosis not present

## 2020-02-01 DIAGNOSIS — Z23 Encounter for immunization: Secondary | ICD-10-CM | POA: Diagnosis not present

## 2020-02-01 DIAGNOSIS — D443 Neoplasm of uncertain behavior of pituitary gland: Secondary | ICD-10-CM | POA: Diagnosis not present

## 2020-02-13 ENCOUNTER — Ambulatory Visit (INDEPENDENT_AMBULATORY_CARE_PROVIDER_SITE_OTHER): Payer: Medicare Other | Admitting: Family Medicine

## 2020-02-13 ENCOUNTER — Other Ambulatory Visit: Payer: Self-pay

## 2020-02-13 DIAGNOSIS — R1319 Other dysphagia: Secondary | ICD-10-CM

## 2020-02-13 DIAGNOSIS — I200 Unstable angina: Secondary | ICD-10-CM | POA: Diagnosis not present

## 2020-02-13 DIAGNOSIS — R7303 Prediabetes: Secondary | ICD-10-CM | POA: Diagnosis not present

## 2020-02-13 DIAGNOSIS — R252 Cramp and spasm: Secondary | ICD-10-CM | POA: Diagnosis not present

## 2020-02-13 DIAGNOSIS — M720 Palmar fascial fibromatosis [Dupuytren]: Secondary | ICD-10-CM | POA: Diagnosis not present

## 2020-02-13 DIAGNOSIS — G6289 Other specified polyneuropathies: Secondary | ICD-10-CM

## 2020-02-13 MED ORDER — SUCRALFATE 1 G PO TABS: ORAL_TABLET | ORAL | 0 refills | Status: DC

## 2020-02-13 NOTE — Progress Notes (Signed)
   Subjective:    Patient ID: [name], male    DOB: Nov 23, 1940, 79 y.o.   MRN: [id number]  HPIpt states it has been hard for him to swallow for past 4 -5 days.  Patient relates that when he swallows he feels like food gets stuck in the upper esophagus he relates a lot of burning and discomfort.  He denies any total blockage of his throat No choking or passing out He is trying to be careful with his chewing and swallowing he has had his esophagus stretched in the past  Would like a referral for both of his small fingers. Not able to straightened them out.  He has a history of Dupuytren's contractures.  Esophageal dysphagia - Plan: Ambulatory referral to Gastroenterology  Other polyneuropathy  Muscle cramp - Plan: Vitamin B12  Dupuytren contracture - Plan: Ambulatory referral to Hand Surgery  Prediabetes - Plan: Hemoglobin N8G, Basic metabolic panel    Review of Systems    See above Objective:   Physical Exam  Lungs clear respiratory normal heart regular no murmurs extremities no edema skin warm dry neck no masses no tenderness no lymphadenopathy throat is normal      Assessment & Plan:  1. Esophageal dysphagia Referral to gastroenterology urgent will need EGD and stretching.  Will go ahead and prescribe Carafate to see if it will help with some of the burning continue PPI minimize spicy foods in the diet - Ambulatory referral to Gastroenterology  2. Other polyneuropathy Check B12 level.  Check A1c to make sure he is not developing diabetes.  3. Muscle cramp Stretching exercises recommended.  Vitamin B complex recommended - Vitamin B12  4. Dupuytren contracture Referral to hand surgery he does not want to go back to see the one who saw him previously he is interested in getting injections to try to help this - Ambulatory referral to Hand Surgery  5. Prediabetes A1c ordered - Hemoglobin N5A - Basic metabolic panel

## 2020-02-14 ENCOUNTER — Encounter: Payer: Self-pay | Admitting: Family Medicine

## 2020-02-14 DIAGNOSIS — R252 Cramp and spasm: Secondary | ICD-10-CM | POA: Diagnosis not present

## 2020-02-14 DIAGNOSIS — R7303 Prediabetes: Secondary | ICD-10-CM | POA: Diagnosis not present

## 2020-02-15 ENCOUNTER — Encounter: Payer: Self-pay | Admitting: Internal Medicine

## 2020-02-15 LAB — BASIC METABOLIC PANEL
BUN/Creatinine Ratio: 16 (ref 10–24)
BUN: 14 mg/dL (ref 8–27)
CO2: 24 mmol/L (ref 20–29)
Calcium: 9 mg/dL (ref 8.6–10.2)
Chloride: 98 mmol/L (ref 96–106)
Creatinine, Ser: 0.85 mg/dL (ref 0.76–1.27)
GFR calc Af Amer: 97 mL/min/{1.73_m2} (ref >59)
GFR calc non Af Amer: 84 mL/min/{1.73_m2} (ref >59)
Glucose: 102 mg/dL — ABNORMAL HIGH (ref 65–99)
Potassium: 5.3 mmol/L — ABNORMAL HIGH (ref 3.5–5.2)
Sodium: 134 mmol/L (ref 134–144)

## 2020-02-15 LAB — HEMOGLOBIN A1C
Est. average glucose Bld gHb Est-mCnc: 137 mg/dL
Hgb A1c MFr Bld: 6.4 % — ABNORMAL HIGH (ref 4.8–5.6)

## 2020-02-15 LAB — VITAMIN B12: Vitamin B-12: 449 pg/mL (ref 232–1245)

## 2020-02-16 ENCOUNTER — Encounter: Payer: Self-pay | Admitting: Family Medicine

## 2020-02-16 DIAGNOSIS — R7303 Prediabetes: Secondary | ICD-10-CM | POA: Insufficient documentation

## 2020-02-18 ENCOUNTER — Telehealth: Payer: Self-pay

## 2020-02-18 NOTE — Telephone Encounter (Signed)
Please give patient an appointment for tomorrow. Thank you!

## 2020-02-18 NOTE — Telephone Encounter (Signed)
Patient called in complaining of ankles swelling and wanted to see Dr. Nicki Reaper. No other symptoms.  Was seen on 02/13/20 for throat pain.  No available appts today.  How should I schedule this patient?

## 2020-02-19 ENCOUNTER — Ambulatory Visit (INDEPENDENT_AMBULATORY_CARE_PROVIDER_SITE_OTHER): Payer: Medicare Other | Admitting: Family Medicine

## 2020-02-19 ENCOUNTER — Encounter: Payer: Self-pay | Admitting: Family Medicine

## 2020-02-19 ENCOUNTER — Ambulatory Visit: Payer: Medicare Other | Admitting: Family Medicine

## 2020-02-19 VITALS — BP 114/66 | HR 82 | Temp 98.0°F | Wt 145.0 lb

## 2020-02-19 DIAGNOSIS — R252 Cramp and spasm: Secondary | ICD-10-CM

## 2020-02-19 DIAGNOSIS — R0600 Dyspnea, unspecified: Secondary | ICD-10-CM

## 2020-02-19 DIAGNOSIS — I200 Unstable angina: Secondary | ICD-10-CM

## 2020-02-19 DIAGNOSIS — R0609 Other forms of dyspnea: Secondary | ICD-10-CM

## 2020-02-19 DIAGNOSIS — R600 Localized edema: Secondary | ICD-10-CM | POA: Diagnosis not present

## 2020-02-19 MED ORDER — VERAPAMIL HCL ER 120 MG PO TBCR: 120.0000 mg | EXTENDED_RELEASE_TABLET | Freq: Every day | ORAL | 3 refills | Status: DC

## 2020-02-19 MED ORDER — CYCLOBENZAPRINE HCL 5 MG PO TABS: ORAL_TABLET | ORAL | 1 refills | Status: DC

## 2020-02-19 NOTE — Telephone Encounter (Signed)
Have him come in at 1130 I will work him in

## 2020-02-19 NOTE — Progress Notes (Signed)
   Subjective:    Patient ID: [name], male    DOB: 03-06-1941, 79 y.o.   MRN: [id number]  HPI Patient comes in today with concerns of leg swelling, started at ankles and going up legs.  Patient also reports continued leg and hand cramps.  Significant pedal edema.  Denies high fever chills sweats denies any chest tightness pressure pain denies nausea vomiting diarrhea.  Energy level subpar.  Has a lot of cramps in the legs.  Denies wheezing or difficulty breathing currently  Review of Systems Please see above    Objective:   Physical Exam  Lungs clear no crackles heart regular no wheezing no difficulty breathing extremities some trace edema bilateral.      Assessment & Plan:  1. Pedal edema There is some edema this could be related to amlodipine stop amlodipine switch over the verapamil long-acting.  Also check thyroid function - Ferritin - TSH - T4, free - Magnesium - Basic Metabolic Panel (BMET) - B Nat Peptide  2. Muscle cramp Significant muscle cramps could be related to low iron thyroid as well as magnesium potassium check lab work stretching exercises recommended muscle relaxer Flexeril at nighttime it causes significant drowsiness to let us know Follow-up again in 3 to 4 weeks follow-up sooner problems stretching exercises recommended - Ferritin - TSH - T4, free - Magnesium - Basic Metabolic Panel (BMET) - B Nat Peptide  3. DOE (dyspnea on exertion) Some shortness of breath with moving around but I do not hear any crackles at his lungs and no murmur we will check BMP to make sure that there is not any type of significant CHF going on here but I doubt that. - Ferritin - TSH - T4, free - Magnesium - Basic Metabolic Panel (BMET) - B Nat Peptide

## 2020-02-19 NOTE — Telephone Encounter (Signed)
Pt called back this morning.  Up to knees with swelling. Began several days ago. Pt states legs have been hurting for one or two weeks. Having painful cramps. Saw provider last week and was instructed to take B12 but has not helped. Noticed swelling real bad the last couple of days in feet and ankles. While taking a shower, he noticed swelling in knees. Santiago Glad has a 3:30 pm appt today, otherwise booked. Please advise. Thank you

## 2020-02-20 LAB — BASIC METABOLIC PANEL
BUN/Creatinine Ratio: 14 (ref 10–24)
BUN: 12 mg/dL (ref 8–27)
CO2: 25 mmol/L (ref 20–29)
Calcium: 8.9 mg/dL (ref 8.6–10.2)
Chloride: 95 mmol/L — ABNORMAL LOW (ref 96–106)
Creatinine, Ser: 0.84 mg/dL (ref 0.76–1.27)
GFR calc Af Amer: 98 mL/min/{1.73_m2} (ref >59)
GFR calc non Af Amer: 84 mL/min/{1.73_m2} (ref >59)
Glucose: 84 mg/dL (ref 65–99)
Potassium: 4.4 mmol/L (ref 3.5–5.2)
Sodium: 133 mmol/L — ABNORMAL LOW (ref 134–144)

## 2020-02-20 LAB — TSH: TSH: 3.5 u[IU]/mL (ref 0.450–4.500)

## 2020-02-20 LAB — MAGNESIUM: Magnesium: 2 mg/dL (ref 1.6–2.3)

## 2020-02-20 LAB — FERRITIN: Ferritin: 313 ng/mL (ref 30–400)

## 2020-02-20 LAB — BRAIN NATRIURETIC PEPTIDE: BNP: 90.6 pg/mL (ref 0.0–100.0)

## 2020-02-20 LAB — T4, FREE: Free T4: 1.41 ng/dL (ref 0.82–1.77)

## 2020-02-25 ENCOUNTER — Other Ambulatory Visit: Payer: Self-pay | Admitting: *Deleted

## 2020-02-25 ENCOUNTER — Ambulatory Visit: Payer: Medicare Other | Admitting: Family Medicine

## 2020-02-25 MED ORDER — TIZANIDINE HCL 2 MG PO CAPS: ORAL_CAPSULE | ORAL | 2 refills | Status: DC

## 2020-02-27 ENCOUNTER — Telehealth: Payer: Self-pay

## 2020-02-27 ENCOUNTER — Other Ambulatory Visit: Payer: Self-pay

## 2020-02-27 DIAGNOSIS — R1310 Dysphagia, unspecified: Secondary | ICD-10-CM

## 2020-02-27 NOTE — Telephone Encounter (Signed)
Pt scheduled for barium swallow at [facility] 03/03/20@9am , pt to arrive there at 8:45am to register. Pt to be NPO after 6am. Left message for pt to call back.  Spoke with pt and he is aware of appt.

## 2020-02-27 NOTE — Telephone Encounter (Signed)
-----   Message from Irene Shipper, MD sent at 02/27/2020  8:55 AM EDT ----- Regarding: Barium swallow, appointment Thank you Scott.  We will address this issue.Sruthi Maurer,I saw this patient in 2015 for dysphagia.  He underwent upper endoscopy.  Possible cricopharyngeal bar.  I requested barium swallow with tablet.  That was never done.  He is having worsening dysphagia (see below).  Please arrange barium swallow with tablet ASAP, for dysphagia.  We will take it from there.  Thanks.Dr. 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  He has been having multiple spells of dysphagia.  He has an appointment with you in early December.  Unfortunately yesterday he had a severe spell of dysphagia of esophageal oral pharyngeal choking.  He thought he would have to go to the emergency department to get it relieved but then it self relieved.  I am concerned that if this patient waits till December more than likely he will present to the ER with impaction of the esophagus.  In the meantime we will tell him to eat soft diet avoid chunky food.  We will also encourage him to drink liquids with his food.  If it is possible for him to be seen by someone within your office in the near future to help set him up for EGD and dilation that would be appreciated.  Thank you for your help-Scott L [facility]

## 2020-03-03 ENCOUNTER — Telehealth: Payer: Self-pay | Admitting: Internal Medicine

## 2020-03-03 ENCOUNTER — Ambulatory Visit (HOSPITAL_COMMUNITY)
Admission: RE | Admit: 2020-03-03 | Discharge: 2020-03-03 | Disposition: A | Discharge status: Home / Self Care | Payer: Medicare Other | Source: Ambulatory Visit | Attending: Internal Medicine | Admitting: Internal Medicine

## 2020-03-03 ENCOUNTER — Other Ambulatory Visit: Payer: Self-pay

## 2020-03-03 DIAGNOSIS — K224 Dyskinesia of esophagus: Secondary | ICD-10-CM | POA: Diagnosis not present

## 2020-03-03 DIAGNOSIS — R1310 Dysphagia, unspecified: Secondary | ICD-10-CM | POA: Insufficient documentation

## 2020-03-04 NOTE — Telephone Encounter (Signed)
See result note.  

## 2020-03-05 DIAGNOSIS — M79642 Pain in left hand: Secondary | ICD-10-CM | POA: Diagnosis not present

## 2020-03-05 DIAGNOSIS — M720 Palmar fascial fibromatosis [Dupuytren]: Secondary | ICD-10-CM | POA: Diagnosis not present

## 2020-03-05 DIAGNOSIS — M79641 Pain in right hand: Secondary | ICD-10-CM | POA: Diagnosis not present

## 2020-03-10 ENCOUNTER — Other Ambulatory Visit: Payer: Self-pay

## 2020-03-10 ENCOUNTER — Ambulatory Visit (AMBULATORY_SURGERY_CENTER): Payer: Self-pay | Admitting: *Deleted

## 2020-03-10 VITALS — Ht 67.0 in | Wt 141.0 lb

## 2020-03-10 DIAGNOSIS — R1310 Dysphagia, unspecified: Secondary | ICD-10-CM

## 2020-03-10 NOTE — Progress Notes (Signed)

## 2020-03-12 ENCOUNTER — Ambulatory Visit (INDEPENDENT_AMBULATORY_CARE_PROVIDER_SITE_OTHER): Payer: Medicare Other | Admitting: Family Medicine

## 2020-03-12 ENCOUNTER — Encounter: Payer: Self-pay | Admitting: Family Medicine

## 2020-03-12 ENCOUNTER — Other Ambulatory Visit: Payer: Self-pay

## 2020-03-12 VITALS — BP 120/62 | Temp 97.6°F | Ht 67.0 in | Wt 141.0 lb

## 2020-03-12 DIAGNOSIS — I10 Essential (primary) hypertension: Secondary | ICD-10-CM

## 2020-03-12 DIAGNOSIS — M720 Palmar fascial fibromatosis [Dupuytren]: Secondary | ICD-10-CM

## 2020-03-12 DIAGNOSIS — I200 Unstable angina: Secondary | ICD-10-CM

## 2020-03-12 NOTE — Progress Notes (Signed)
   Subjective:    Patient ID: [name], male    DOB: June 26, 1940, 79 y.o.   MRN: [id number]  HPI  Patient arrives for a follow up on pedal edema. Patient states the swelling has went away since stopping the amlodipine but he is not able to swallow the new BP pill because it gets hung in his throat and he would like to switch it to something else. Patient denies any type of chest pain shortness of breath Blood pressure elevated yesterday but doing well today  Patient states that he did do his swallowing test and will be having a esophagus stretching/EGD in the future chart  Review of Systems  Constitutional: Negative for activity change, fatigue and fever.  HENT: Negative for congestion and rhinorrhea.   Respiratory: Negative for cough and shortness of breath.   Cardiovascular: Negative for chest pain and leg swelling.  Gastrointestinal: Negative for abdominal pain, diarrhea and nausea.  Genitourinary: Negative for dysuria and hematuria.  Neurological: Negative for weakness and headaches.  Psychiatric/Behavioral: Negative for agitation and behavioral problems.       Objective:   Physical Exam Vitals reviewed.  Cardiovascular:     Rate and Rhythm: Normal rate and regular rhythm.     Heart sounds: Normal heart sounds. No murmur heard.   Pulmonary:     Effort: Pulmonary effort is normal.     Breath sounds: Normal breath sounds.  Lymphadenopathy:     Cervical: No cervical adenopathy.  Neurological:     Mental Status: He is alert.  Psychiatric:        Behavior: Behavior normal.            Assessment & Plan:  Patient states he has had his flu shot Blood pressure good control today He states it is up at times I told him to check blood pressure frequently over the next 2 weeks and bring his monitor with him and we will consider possibly adding a medicine or not depending on those readings, depending on the effectiveness of his blood pressure monitor, and depending on his  follow-up blood pressure reading  Patient also has a Dupuytren contracture we will refer to [facility] he would like to get a more definitive opinion on whether or not anything can be done for this  Previous orthopedist recommended an injection of an enzyme that may or may not help

## 2020-03-12 NOTE — Patient Instructions (Addendum)
Bring your BP cuff with you to your visit Nov 23 11:30   DASH Eating Plan DASH stands for "Dietary Approaches to Stop Hypertension." The DASH eating plan is a healthy eating plan that has been shown to reduce high blood pressure (hypertension). It may also reduce your risk for type 2 diabetes, heart disease, and stroke. The DASH eating plan may also help with weight loss. What are tips for following this plan?  General guidelines  Avoid eating more than 2,300 mg (milligrams) of salt (sodium) a day. If you have hypertension, you may need to reduce your sodium intake to 1,500 mg a day.  Limit alcohol intake to no more than 1 drink a day for nonpregnant women and 2 drinks a day for men. One drink equals 12 oz of beer, 5 oz of wine, or 1 oz of hard liquor.  Work with your health care provider to maintain a healthy body weight or to lose weight. Ask what an ideal weight is for you.  Get at least 30 minutes of exercise that causes your heart to beat faster (aerobic exercise) most days of the week. Activities may include walking, swimming, or biking.  Work with your health care provider or diet and nutrition specialist (dietitian) to adjust your eating plan to your individual calorie needs. Reading food labels   Check food labels for the amount of sodium per serving. Choose foods with less than 5 percent of the Daily Value of sodium. Generally, foods with less than 300 mg of sodium per serving fit into this eating plan.  To find whole grains, look for the word "whole" as the first word in the ingredient list. Shopping  Buy products labeled as "low-sodium" or "no salt added."  Buy fresh foods. Avoid canned foods and premade or frozen meals. Cooking  Avoid adding salt when cooking. Use salt-free seasonings or herbs instead of table salt or sea salt. Check with your health care provider or pharmacist before using salt substitutes.  Do not fry foods. Cook foods using healthy methods such  as baking, boiling, grilling, and broiling instead.  Cook with heart-healthy oils, such as olive, canola, soybean, or sunflower oil. Meal planning  Eat a balanced diet that includes: ? 5 or more servings of fruits and vegetables each day. At each meal, try to fill half of your plate with fruits and vegetables. ? Up to 6-8 servings of whole grains each day. ? Less than 6 oz of lean meat, poultry, or fish each day. A 3-oz serving of meat is about the same size as a deck of cards. One egg equals 1 oz. ? 2 servings of low-fat dairy each day. ? A serving of nuts, seeds, or beans 5 times each week. ? Heart-healthy fats. Healthy fats called Omega-3 fatty acids are found in foods such as flaxseeds and coldwater fish, like sardines, salmon, and mackerel.  Limit how much you eat of the following: ? Canned or prepackaged foods. ? Food that is high in trans fat, such as fried foods. ? Food that is high in saturated fat, such as fatty meat. ? Sweets, desserts, sugary drinks, and other foods with added sugar. ? Full-fat dairy products.  Do not salt foods before eating.  Try to eat at least 2 vegetarian meals each week.  Eat more home-cooked food and less restaurant, buffet, and fast food.  When eating at a restaurant, ask that your food be prepared with less salt or no salt, if possible. What foods are  recommended? The items listed may not be a complete list. Talk with your dietitian about what dietary choices are best for you. Grains Whole-grain or whole-wheat bread. Whole-grain or whole-wheat pasta. Brown rice. [place]row. Bulgur. Whole-grain and low-sodium cereals. Pita bread. Low-fat, low-sodium crackers. Whole-wheat flour tortillas. Vegetables Fresh or frozen vegetables (raw, steamed, roasted, or grilled). Low-sodium or reduced-sodium tomato and vegetable juice. Low-sodium or reduced-sodium tomato sauce and tomato paste. Low-sodium or reduced-sodium canned vegetables. Fruits All fresh,  dried, or frozen fruit. Canned fruit in natural juice (without added sugar). Meat and other protein foods Skinless chicken or [place]. Ground chicken or [place]. Pork with fat trimmed off. Fish and seafood. Egg whites. Dried beans, peas, or lentils. Unsalted nuts, nut butters, and seeds. Unsalted canned beans. Lean cuts of beef with fat trimmed off. Low-sodium, lean deli meat. Dairy Low-fat (1%) or fat-free (skim) milk. Fat-free, low-fat, or reduced-fat cheeses. Nonfat, low-sodium ricotta or cottage cheese. Low-fat or nonfat yogurt. Low-fat, low-sodium cheese. Fats and oils Soft margarine without trans fats. Vegetable oil. Low-fat, reduced-fat, or light mayonnaise and salad dressings (reduced-sodium). Canola, safflower, olive, soybean, and sunflower oils. Avocado. Seasoning and other foods Herbs. Spices. Seasoning mixes without salt. Unsalted popcorn and pretzels. Fat-free sweets. What foods are not recommended? The items listed may not be a complete list. Talk with your dietitian about what dietary choices are best for you. Grains Baked goods made with fat, such as croissants, muffins, or some breads. Dry pasta or rice meal packs. Vegetables Creamed or fried vegetables. Vegetables in a cheese sauce. Regular canned vegetables (not low-sodium or reduced-sodium). Regular canned tomato sauce and paste (not low-sodium or reduced-sodium). Regular tomato and vegetable juice (not low-sodium or reduced-sodium). Angie Fava. Olives. Fruits Canned fruit in a light or heavy syrup. Fried fruit. Fruit in cream or butter sauce. Meat and other protein foods Fatty cuts of meat. Ribs. Fried meat. Berniece Salines. Sausage. Bologna and other processed lunch meats. Salami. Fatback. Hotdogs. Bratwurst. Salted nuts and seeds. Canned beans with added salt. Canned or smoked fish. Whole eggs or egg yolks. Chicken or [place] with skin. Dairy Whole or 2% milk, cream, and half-and-half. Whole or full-fat cream cheese. Whole-fat or sweetened  yogurt. Full-fat cheese. Nondairy creamers. Whipped toppings. Processed cheese and cheese spreads. Fats and oils Butter. Stick margarine. Lard. Shortening. Ghee. Bacon fat. Tropical oils, such as coconut, palm kernel, or palm oil. Seasoning and other foods Salted popcorn and pretzels. Onion salt, garlic salt, seasoned salt, table salt, and sea salt. Worcestershire sauce. Tartar sauce. Barbecue sauce. Teriyaki sauce. Soy sauce, including reduced-sodium. Steak sauce. Canned and packaged gravies. Fish sauce. Oyster sauce. Cocktail sauce. Horseradish that you find on the shelf. Ketchup. Mustard. Meat flavorings and tenderizers. Bouillon cubes. Hot sauce and Tabasco sauce. Premade or packaged marinades. Premade or packaged taco seasonings. Relishes. Regular salad dressings. Where to find more information:  National Heart, Lung, and [place]: https://wilson-eaton.com/  American Heart Association: www.heart.org Summary  The DASH eating plan is a healthy eating plan that has been shown to reduce high blood pressure (hypertension). It may also reduce your risk for type 2 diabetes, heart disease, and stroke.  With the DASH eating plan, you should limit salt (sodium) intake to 2,300 mg a day. If you have hypertension, you may need to reduce your sodium intake to 1,500 mg a day.  When on the DASH eating plan, aim to eat more fresh fruits and vegetables, whole grains, lean proteins, low-fat dairy, and heart-healthy fats.  Work with your health  care provider or diet and nutrition specialist (dietitian) to adjust your eating plan to your individual calorie needs. This information is not intended to replace advice given to you by your health care provider. Make sure you discuss any questions you have with your health care provider. Document Revised: 04/08/2017 Document Reviewed: 04/19/2016 Elsevier Patient Education  2020 [employer].

## 2020-03-14 ENCOUNTER — Telehealth: Payer: Self-pay | Admitting: Family Medicine

## 2020-03-14 ENCOUNTER — Other Ambulatory Visit: Payer: Self-pay

## 2020-03-14 MED ORDER — PRAVASTATIN SODIUM 10 MG PO TABS
10.0000 mg | ORAL_TABLET | Freq: Every day | ORAL | 3 refills | Status: DC
Start: 2020-03-14 — End: 2022-05-19

## 2020-03-14 NOTE — Telephone Encounter (Signed)
refilled Pravastatin from [facility] request

## 2020-03-20 ENCOUNTER — Ambulatory Visit (AMBULATORY_SURGERY_CENTER): Payer: Medicare Other | Admitting: Internal Medicine

## 2020-03-20 ENCOUNTER — Encounter: Payer: Self-pay | Admitting: Internal Medicine

## 2020-03-20 ENCOUNTER — Other Ambulatory Visit: Payer: Self-pay

## 2020-03-20 VITALS — BP 99/60 | HR 84 | Temp 97.1°F | Resp 15 | Ht 67.0 in | Wt 141.0 lb

## 2020-03-20 DIAGNOSIS — R1310 Dysphagia, unspecified: Secondary | ICD-10-CM | POA: Diagnosis not present

## 2020-03-20 DIAGNOSIS — K225 Diverticulum of esophagus, acquired: Secondary | ICD-10-CM | POA: Diagnosis not present

## 2020-03-20 DIAGNOSIS — K219 Gastro-esophageal reflux disease without esophagitis: Secondary | ICD-10-CM | POA: Diagnosis not present

## 2020-03-20 MED ORDER — SODIUM CHLORIDE 0.9 % IV SOLN: 500.0000 mL | Freq: Once | INTRAVENOUS | Status: DC

## 2020-03-20 NOTE — Progress Notes (Signed)
Lidocaine buffer  robinol antisialgogue

## 2020-03-20 NOTE — Progress Notes (Signed)
VS by FD   Pt's states no medical or surgical changes since previsit or office visit.

## 2020-03-20 NOTE — Progress Notes (Signed)
A and O x3. Report to RN. Tolerated MAC anesthesia well.Teeth unchanged after procedure.

## 2020-03-20 NOTE — Op Note (Signed)
[facility] Patient Name: [name] Procedure Date: 03/20/2020 3:54 PM MRN: [id number] Endoscopist: Docia Chuck. Henrene Pastor MD, MD Age: 79 Referring MD:  Date of Birth: 04-30-1941 Gender: Male Account #: [id number] Procedure:                Upper GI endoscopy Indications:              Dysphagia, Abnormal cine-esophagram Medicines:                Monitored Anesthesia Care Procedure:                Pre-Anesthesia Assessment:                           - Prior to the procedure, a History and Physical                            was performed, and patient medications and                            allergies were reviewed. The patient's tolerance of                            previous anesthesia was also reviewed. The risks                            and benefits of the procedure and the sedation                            options and risks were discussed with the patient.                            All questions were answered, and informed consent                            was obtained. Prior Anticoagulants: The patient has                            taken no previous anticoagulant or antiplatelet                            agents. ASA Grade Assessment: II - A patient with                            mild systemic disease. After reviewing the risks                            and benefits, the patient was deemed in                            satisfactory condition to undergo the procedure.                           After obtaining informed consent, the endoscope was  passed under direct vision. Throughout the                            procedure, the patient's blood pressure, pulse, and                            oxygen saturations were monitored continuously. The                            Endoscope was introduced through the mouth, with                            the intention of advancing to the esophagus. The                            scope was advanced to  the cricopharyngeal esophagus                            before the procedure was aborted. Medications were                            given. The procedure was aborted due to abnormal                            anatomy. Scope In: Scope Out: Findings:                 Tight cricopharyngeus (cricopharyngeal bar) not                            allowing the passage of the endoscope beyond the                            UES. Small lateral diverticulum. Complications:            No immediate complications. Estimated Blood Loss:     Estimated blood loss: none. Impression:               1. High-grade cricopharyngeal bar not allowing                            passage of the endoscope beyond. Recommendation:           1. Resume previous diet                           2. We will arrange for tertiary care evaluation. Docia Chuck. Henrene Pastor MD, MD 03/20/2020 4:09:43 PM This report has been signed electronically.

## 2020-03-20 NOTE — Patient Instructions (Signed)
Discharge instructions given. Incomplete procedure. Resume previous diet. Office will arrange for tertiary care evaluation. YOU HAD AN ENDOSCOPIC PROCEDURE TODAY AT [facility]:   Refer to the procedure report that was given to you for any specific questions about what was found during the examination.  If the procedure report does not answer your questions, please call your gastroenterologist to clarify.  If you requested that your care partner not be given the details of your procedure findings, then the procedure report has been included in a sealed envelope for you to review at your convenience later.  YOU SHOULD EXPECT: Some feelings of bloating in the abdomen. Passage of more gas than usual.  Walking can help get rid of the air that was put into your GI tract during the procedure and reduce the bloating. If you had a lower endoscopy (such as a colonoscopy or flexible sigmoidoscopy) you may notice spotting of blood in your stool or on the toilet paper. If you underwent a bowel prep for your procedure, you may not have a normal bowel movement for a few days.  Please Note:  You might notice some irritation and congestion in your nose or some drainage.  This is from the oxygen used during your procedure.  There is no need for concern and it should clear up in a day or so.  SYMPTOMS TO REPORT IMMEDIATELY:    Following upper endoscopy (EGD)  Vomiting of blood or coffee ground material  New chest pain or pain under the shoulder blades  Painful or persistently difficult swallowing  New shortness of breath  Fever of 100F or higher  Black, tarry-looking stools  For urgent or emergent issues, a gastroenterologist can be reached at any hour by calling [phone]. Do not use MyChart messaging for urgent concerns.    DIET:  We do recommend a small meal at first, but then you may proceed to your regular diet.  Drink plenty of fluids but you should avoid alcoholic beverages for  24 hours.  ACTIVITY:  You should plan to take it easy for the rest of today and you should NOT DRIVE or use heavy machinery until tomorrow (because of the sedation medicines used during the test).    FOLLOW UP: Our staff will call the number listed on your records 48-72 hours following your procedure to check on you and address any questions or concerns that you may have regarding the information given to you following your procedure. If we do not reach you, we will leave a message.  We will attempt to reach you two times.  During this call, we will ask if you have developed any symptoms of COVID 19. If you develop any symptoms (ie: fever, flu-like symptoms, shortness of breath, cough etc.) before then, please call [phone].  If you test positive for Covid 19 in the 2 weeks post procedure, please call and report this information to [place].    If any biopsies were taken you will be contacted by phone or by letter within the next 1-3 weeks.  Please call us at [phone] if you have not heard about the biopsies in 3 weeks.    SIGNATURES/CONFIDENTIALITY: You and/or your care partner have signed paperwork which will be entered into your electronic medical record.  These signatures attest to the fact that that the information above on your After Visit Summary has been reviewed and is understood.  Full responsibility of the confidentiality of this discharge information lies with you and/or  your care-partner.

## 2020-03-24 ENCOUNTER — Telehealth: Payer: Self-pay | Admitting: *Deleted

## 2020-03-24 DIAGNOSIS — M720 Palmar fascial fibromatosis [Dupuytren]: Secondary | ICD-10-CM | POA: Diagnosis not present

## 2020-03-24 NOTE — Telephone Encounter (Signed)
°  Follow up Call-  Call back number 03/20/2020  Post procedure Call Back phone  # [phone]  Permission to leave phone message Yes  Some recent data might be hidden     Patient questions:  Do you have a fever, pain , or abdominal swelling? No. Pain Score  0 *  Have you tolerated food without any problems? Yes.    Have you been able to return to your normal activities? Yes.    Do you have any questions about your discharge instructions: Diet   No. Medications  No. Follow up visit  No.  Do you have questions or concerns about your Care? No.  Actions: * If pain score is 4 or above: No action needed, pain <4  1. Have you developed a fever since your procedure? NO  2.   Have you had an respiratory symptoms (SOB or cough) since your procedure? NO  3.   Have you tested positive for COVID 19 since your procedure NO  4.   Have you had any family members/close contacts diagnosed with the COVID 19 since your procedure?  NO   If yes to any of these questions please route to Joylene Keoni, RN and Joella Prince, RN

## 2020-03-26 ENCOUNTER — Telehealth: Payer: Self-pay

## 2020-03-26 NOTE — Telephone Encounter (Signed)
Referral and records faxed to [facility] requesting appt as listed below.

## 2020-03-26 NOTE — Telephone Encounter (Signed)
-----   Message from Irene Shipper, MD sent at 03/25/2020  3:28 PM EST ----- Regarding: RE: Question Gioia Ranes,Please arrange for this patient to see Dr. Sherri Rad at [place]. The clinical problem is severe dysphagia. Barium esophagram revealed tight cricopharyngeus with tiny left posterior lateral diverticulum. Tablet passed on esophagram. Endoscope did not pass beyond the cricopharyngeus on the day of his procedure. The question is, with this patient benefit from cricopharyngeal myotomy, or other treatment?Dr. Henrene Pastor ----- Message ----- From: Algernon Huxley, RN Sent: 03/24/2020  11:08 AM EST To: Irene Shipper, MD Subject: Question                                       Dr. Henrene Pastor,  The procedure report from 03/20/20 states to arrange for tertiary care eval. Where do you want to send him and is referral for dysphagia?  Thanks, [employer]

## 2020-03-31 DIAGNOSIS — Z23 Encounter for immunization: Secondary | ICD-10-CM | POA: Diagnosis not present

## 2020-04-01 ENCOUNTER — Other Ambulatory Visit: Payer: Self-pay

## 2020-04-01 ENCOUNTER — Ambulatory Visit (INDEPENDENT_AMBULATORY_CARE_PROVIDER_SITE_OTHER): Payer: Medicare Other | Admitting: Family Medicine

## 2020-04-01 ENCOUNTER — Encounter: Payer: Self-pay | Admitting: Family Medicine

## 2020-04-01 VITALS — BP 112/60 | HR 86 | Temp 97.9°F | Ht 67.0 in | Wt 144.0 lb

## 2020-04-01 DIAGNOSIS — I200 Unstable angina: Secondary | ICD-10-CM

## 2020-04-01 DIAGNOSIS — I10 Essential (primary) hypertension: Secondary | ICD-10-CM | POA: Diagnosis not present

## 2020-04-01 NOTE — Progress Notes (Signed)
   Subjective:    Patient ID: [name], male    DOB: [birth year], 79 y.o.   MRN: [id number]  HPIrecheck on bp. Pt states bp was running low and verapamil was stopped.  Patient with significant blood pressure issues in the past Blood pressure is running low verapamil. States he is not having any problems currently denies any fever chills or sweats   Review of Systems  Constitutional: Negative for activity change, appetite change and fatigue.  HENT: Negative for congestion and rhinorrhea.   Respiratory: Negative for cough and shortness of breath.   Cardiovascular: Negative for chest pain and leg swelling.  Gastrointestinal: Negative for abdominal pain, nausea and vomiting.  Neurological: Negative for dizziness and headaches.  Psychiatric/Behavioral: Negative for agitation and behavioral problems.       Objective:   Physical Exam Vitals reviewed.  Constitutional:      General: He is not in acute distress. HENT:     Head: Normocephalic and atraumatic.  Eyes:     General:        Right eye: No discharge.        Left eye: No discharge.  Neck:     Trachea: No tracheal deviation.  Cardiovascular:     Rate and Rhythm: Normal rate and regular rhythm.     Heart sounds: Normal heart sounds. No murmur heard.   Pulmonary:     Effort: Pulmonary effort is normal. No respiratory distress.     Breath sounds: Normal breath sounds.  Lymphadenopathy:     Cervical: No cervical adenopathy.  Skin:    General: Skin is warm and dry.  Neurological:     Mental Status: He is alert.     Coordination: Coordination normal.  Psychiatric:        Behavior: Behavior normal.    Blood pressure rechecked twice under very good control continue current measures   Patient also has significant narrowing in the esophagus he will be referred to a tertiary center by his gastroenterologist patient was cautioned to eat small amounts to avoid choking    Assessment & Plan:  Blood pressure good control  continue current measures No need to add back medicine Patient understands to stay off of verapamil Follow-up within 4 to 6 months sooner if problems

## 2020-04-10 DIAGNOSIS — R1313 Dysphagia, pharyngeal phase: Secondary | ICD-10-CM | POA: Diagnosis not present

## 2020-04-10 DIAGNOSIS — R198 Other specified symptoms and signs involving the digestive system and abdomen: Secondary | ICD-10-CM | POA: Diagnosis not present

## 2020-04-14 ENCOUNTER — Ambulatory Visit: Payer: Medicare Other | Admitting: Internal Medicine

## 2020-04-21 ENCOUNTER — Other Ambulatory Visit: Payer: Self-pay

## 2020-04-21 ENCOUNTER — Encounter: Payer: Self-pay | Admitting: Cardiology

## 2020-04-21 ENCOUNTER — Ambulatory Visit (INDEPENDENT_AMBULATORY_CARE_PROVIDER_SITE_OTHER): Payer: Medicare Other | Admitting: Cardiology

## 2020-04-21 VITALS — BP 140/60 | HR 78 | Resp 16 | Ht 67.0 in | Wt 148.8 lb

## 2020-04-21 DIAGNOSIS — I200 Unstable angina: Secondary | ICD-10-CM

## 2020-04-21 DIAGNOSIS — I25119 Atherosclerotic heart disease of native coronary artery with unspecified angina pectoris: Secondary | ICD-10-CM

## 2020-04-21 DIAGNOSIS — E782 Mixed hyperlipidemia: Secondary | ICD-10-CM

## 2020-04-21 MED ORDER — ISOSORBIDE MONONITRATE ER 30 MG PO TB24: 30.0000 mg | ORAL_TABLET | Freq: Two times a day (BID) | ORAL | 3 refills | Status: AC

## 2020-04-21 NOTE — Progress Notes (Signed)
Cardiology Office Note  Date: 04/21/2020   ID: KOLDEN DUPEE, DOB 04/20/1941, MRN [id number]  PCP:  [name], MD  Cardiologist:  Rozann Lesches, MD Electrophysiologist:  None   Chief Complaint  Patient presents with  . Follow-up    6 mth    History of Present Illness: [name] is a 79 y.o. male last seen in June by Ms. Strader PA-C.  He presents for a follow-up visit.  Reports dyspnea on exertion and angina symptoms, although somewhat better when he uses his MDIs.  He did have to come off Norvasc related to leg swelling.  We discussed further up titrating his Imdur.  Cardiac catheterization from last year showed overall nonobstructive disease with the exception of a small nondominant RCA that was managed medically.  He was started on low-dose Pravachol at last visit, tolerating this so far.  I reviewed the remainder of his medications as noted below.  Past Medical History:  Diagnosis Date  . Allergy   . Anxiety    past hx - not current   . Arthritis   . CAD (coronary artery disease)    Nonobstructive CAD with the exception of a nondominant RCA that was managed medically in April 2020.  Marland Kitchen Colon polyps    Adenomatous  . Constipation    uses Stool Softener every 3 days   . COPD (chronic obstructive pulmonary disease) ([place])   . Depression    past hx- not current   . Diverticulosis   . Essential hypertension   . GERD (gastroesophageal reflux disease)   . Hypercholesterolemia   . Pituitary microadenoma ([place])    Status post resection 2009  . Prostate cancer ([place])   . Senile purpura ([place]) 10/23/2018  . Thyroid disease     Past Surgical History:  Procedure Laterality Date  . BACK SURGERY  1991  . CHOLECYSTECTOMY  08/2007  . COLONOSCOPY    . LEFT HEART CATH AND CORONARY ANGIOGRAPHY N/A 09/01/2018   Procedure: LEFT HEART CATH AND CORONARY ANGIOGRAPHY;  Surgeon: Troy Sine, MD;  Location: [facility];  Service: Cardiovascular;  Laterality: N/A;  .  POLYPECTOMY    . PROSTATE SURGERY    . Removal of pituitary tumor  9/09  . UPPER GASTROINTESTINAL ENDOSCOPY      Current Outpatient Medications  Medication Sig Dispense Refill  . aspirin EC 81 MG tablet Take 81 mg by mouth daily.    . clobetasol cream (TEMOVATE) 7.62 % Apply 1 application topically 2 (two) times daily.    Marland Kitchen desmopressin (DDAVP) 0.1 MG tablet Take 0.1 mg by mouth 4 (four) times daily as needed (Pt takes 3 to 4 times daily).     Marland Kitchen ezetimibe (ZETIA) 10 MG tablet Take 1 tablet by mouth once daily 30 tablet 0  . levothyroxine (SYNTHROID, LEVOTHROID) 88 MCG tablet Take 88 mcg by mouth every other day.    . metoprolol tartrate (LOPRESSOR) 25 MG tablet TAKE 1/2 (ONE-HALF) TABLET BY MOUTH TWICE DAILY -- NEEDS APPOINTMENT FOR FURTHER REFILLS 30 tablet 0  . nitroGLYCERIN (NITROSTAT) 0.4 MG SL tablet Place 1 tablet (0.4 mg total) under the tongue every 5 (five) minutes as needed for chest pain (Call 911 if still having chest pain after taking 3rd tablet). 50 tablet 0  . omeprazole (PRILOSEC) 20 MG capsule TAKE 1 CAPSULE BY MOUTH EVERY DAY 90 capsule 1  . polyethylene glycol powder (GLYCOLAX/MIRALAX) 17 GM/SCOOP powder Take 17 g by mouth as directed.    Marland Kitchen  pravastatin (PRAVACHOL) 10 MG tablet Take 1 tablet (10 mg total) by mouth daily. 90 tablet 3  . sodium chloride 1 g tablet Take 1 g by mouth in the morning, at noon, and at bedtime.    . Tiotropium Bromide-Olodaterol (STIOLTO RESPIMAT) 2.5-2.5 MCG/ACT AERS Inhale 2 puffs into the lungs daily. 4 g 0  . isosorbide mononitrate (IMDUR) 30 MG 24 hr tablet Take 1 tablet (30 mg total) by mouth 2 (two) times daily. 180 tablet 3   Current Facility-Administered Medications  Medication Dose Route Frequency Provider Last Rate Last Admin  . 0.9 %  sodium chloride infusion  500 mL Intravenous Once Irene Shipper, MD       Allergies:  Codeine, Losartan, Morphine, Other, Statins, and Tramadol   ROS: No palpitations or syncope.  Physical Exam: VS:   BP 140/60   Pulse 78   Resp 16   Ht 5\' 7"  (1.702 m)   Wt 148 lb 12.8 oz (67.5 kg)   SpO2 99%   BMI 23.31 kg/m , BMI Body mass index is 23.31 kg/m.  Wt Readings from Last 3 Encounters:  04/21/20 148 lb 12.8 oz (67.5 kg)  04/01/20 144 lb (65.3 kg)  03/20/20 141 lb (64 kg)    General: Patient appears comfortable at rest. HEENT: Conjunctiva and lids normal, wearing a mask. Neck: Supple, no elevated JVP or carotid bruits, no thyromegaly. Lungs: Clear to auscultation, nonlabored breathing at rest. Cardiac: Regular rate and rhythm, no S3 or significant systolic murmur, no pericardial rub. Extremities: No pitting edema.  ECG:  An ECG dated 09/11/2019 was personally reviewed today and demonstrated:  Sinus bradycardia with sinus arrhythmia.  Recent Labwork: 02/19/2020: BNP 90.6; BUN 12; Creatinine, Ser 0.84; Magnesium 2.0; Potassium 4.4; Sodium 133; TSH 3.500     Component Value Date/Time   CHOL 207 (H) 03/17/2018 0823   TRIG 117 03/17/2018 0823   HDL 48 03/17/2018 0823   CHOLHDL 4.3 03/17/2018 0823   CHOLHDL 5.1 07/16/2013 0938   VLDL 22 07/16/2013 0938   LDLCALC 136 (H) 03/17/2018 0823    Other Studies Reviewed Today:  Echocardiogram 07/03/2019: 1. Left ventricular ejection fraction, by estimation, is 55 to 60%. The  left ventricle has normal function. The left ventricle has no regional  wall motion abnormalities. Left ventricular diastolic parameters were  normal.  2. Right ventricular systolic function is normal. The right ventricular  size is normal.  3. The mitral valve is grossly normal. Trivial mitral valve  regurgitation.  4. The aortic valve is tricuspid. Aortic valve regurgitation is mild. No  aortic stenosis is present.  5. The inferior vena cava is normal in size with <50% respiratory  variability, suggesting right atrial pressure of 8 mmHg.   Assessment and Plan:  1.  Nonobstructive CAD with the exception of a small nondominant RCA with stenosis that was  felt to be best managed medically.  He does have exertional angina.  Did not tolerate Norvasc due to leg swelling.  Continue aspirin, Lopressor, Pravachol, increase Imdur to 30 mg twice daily.  2.  Mixed hyperlipidemia, tolerating low-dose Pravachol so far.  Recheck FLP for next visit.  Medication Adjustments/Labs and Tests Ordered: Current medicines are reviewed at length with the patient today.  Concerns regarding medicines are outlined above.   Tests Ordered: Orders Placed This Encounter  Procedures  . Lipid panel    Medication Changes: Meds ordered this encounter  Medications  . isosorbide mononitrate (IMDUR) 30 MG 24 hr tablet  Sig: Take 1 tablet (30 mg total) by mouth 2 (two) times daily.    Dispense:  180 tablet    Refill:  3    04/21/2020 dose increase    Disposition:  Follow up 6 months.  Signed, Satira Sark, MD, [facility] 04/21/2020 2:54 PM    [facility] [address], [place], [place] [address] Phone: [phone]; Fax: [phone]

## 2020-04-21 NOTE — Patient Instructions (Addendum)
Medication Instructions:   Your physician has recommended you make the following change in your medication:  Increase isosorbide mononitrate to 30 mg by mouth twice daily  Continue other medications the same  Labwork: Your physician recommends that you return for a FASTING lipid profile in 6 months just before your next visit. Please do not eat or drink for at least 8 hours when you have this done. You may take your medications that morning with a sip of water.  This may be done at [facility] ([place]) Monday-Friday from 8:00 am - 4:00 pm. No appointment is needed.  Testing/Procedures:  None  Follow-Up:  Your physician recommends that you schedule a follow-up appointment in: 6 months.  Any Other Special Instructions Will Be Listed Below (If Applicable).  If you need a refill on your cardiac medications before your next appointment, please call your pharmacy.

## 2020-04-28 IMAGING — DX CHEST - 2 VIEW
2 series · 2 of 2 positions shown · non-contrast
Comparison: 07/26/2016 and earlier, including CTA chest 10/16/2013.

CLINICAL DATA: Four-day history of chest pain, BILATERAL shoulder
pain, cough and fatigue. Former smoker.

EXAM:
CHEST - 2 VIEW

[chest pa]
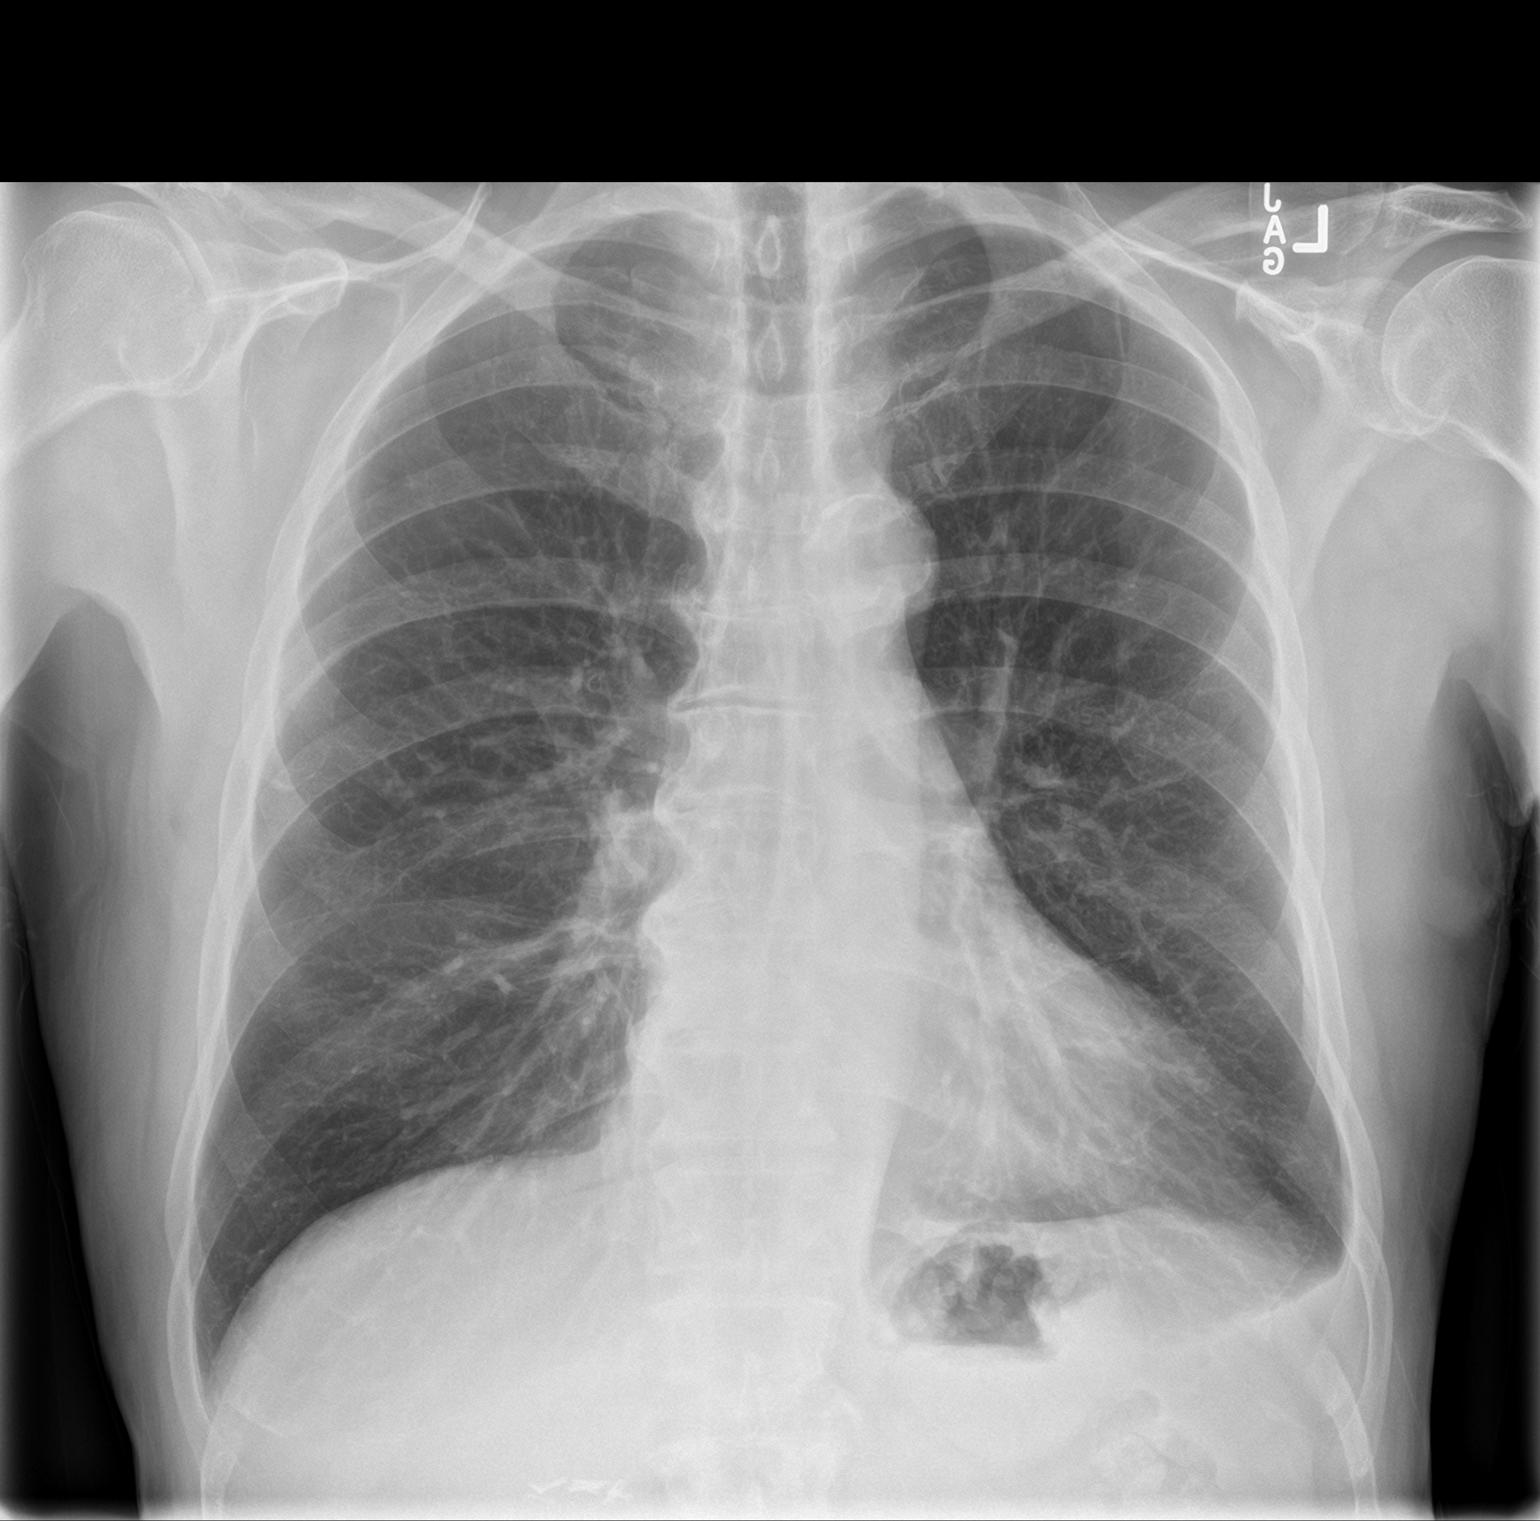

[chest lat]
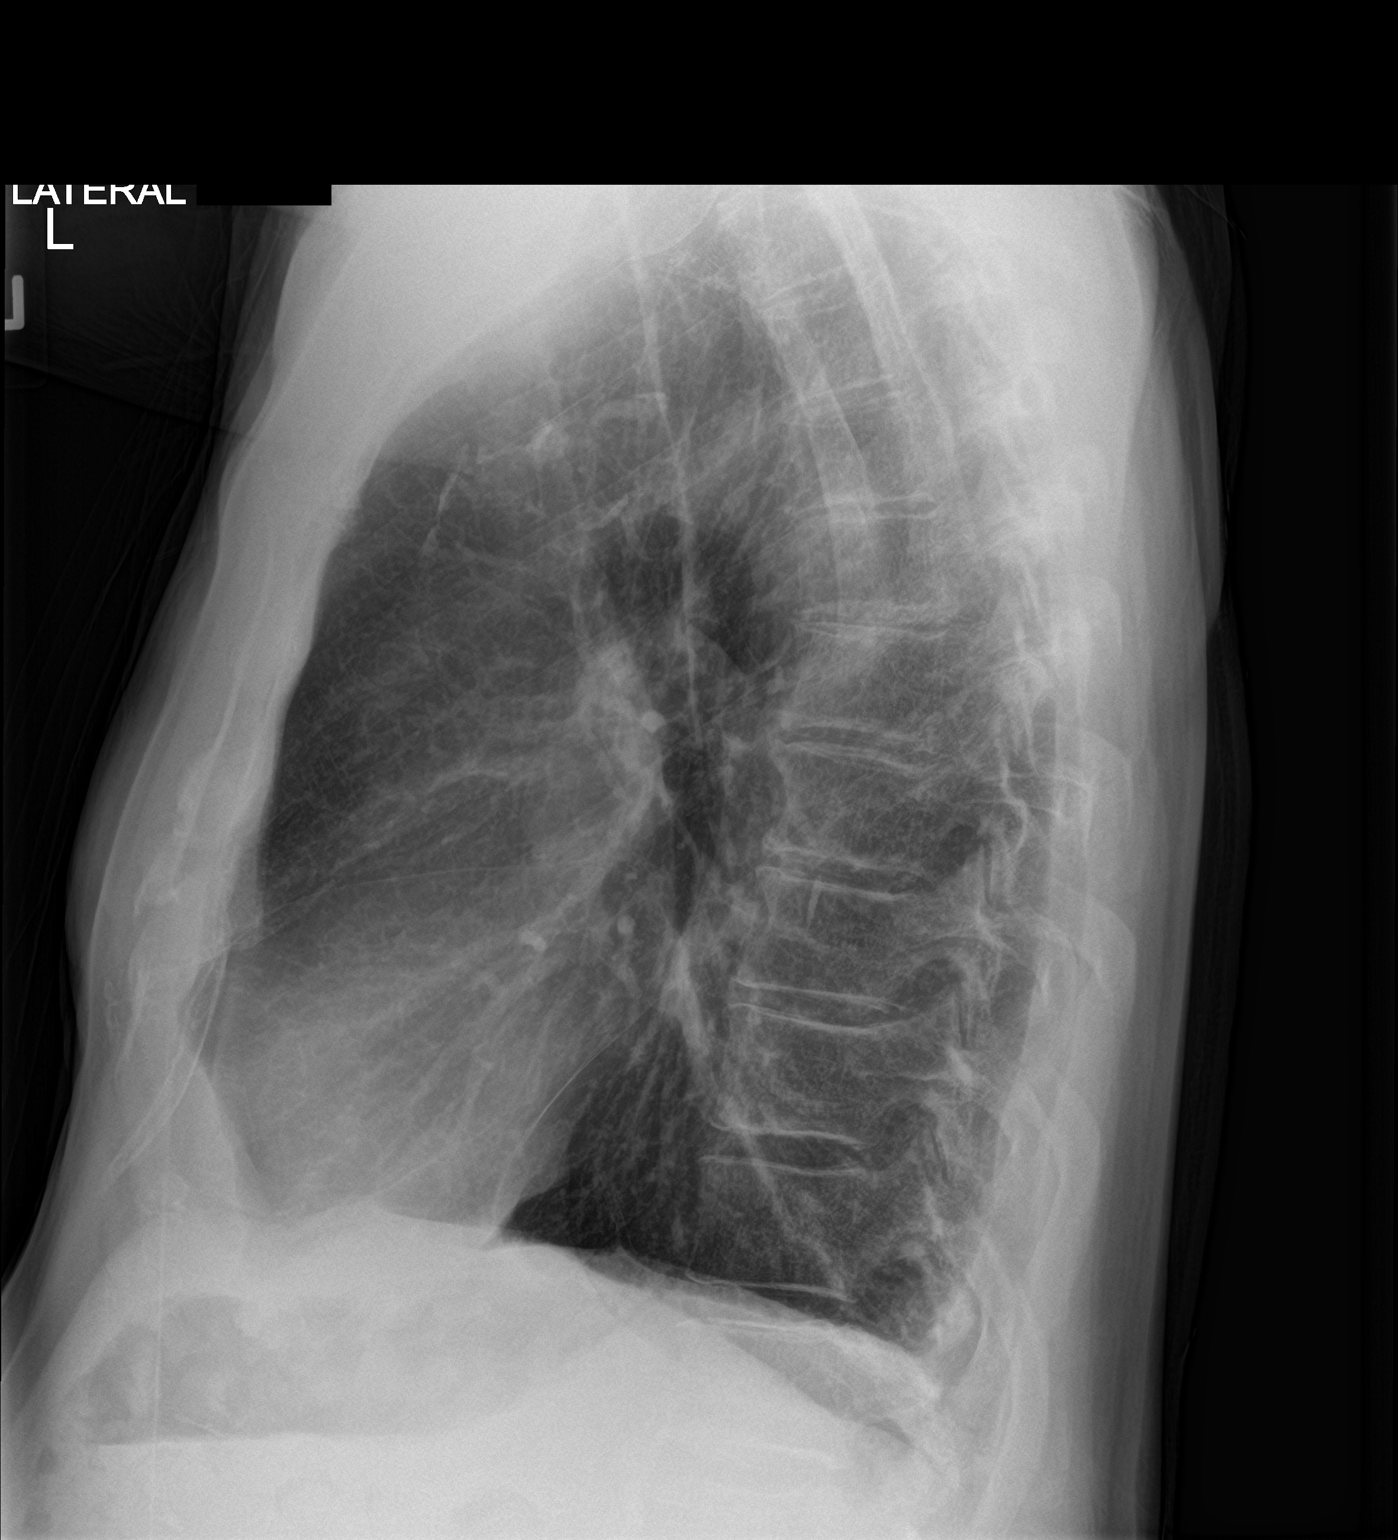

[2 of 2 positions shown; findings below may reference images not displayed]

FINDINGS: Cardiac silhouette normal in size, unchanged. Thoracic aorta
atherosclerotic, unchanged. Hilar and mediastinal contours otherwise
unremarkable. Stable mild hyperinflation. Small LEFT pleural
effusion. Lungs clear. Bronchovascular markings normal. Pulmonary
vascularity normal.
IMPRESSION: 1. Small LEFT pleural effusion. No acute cardiopulmonary disease
otherwise.
2. Stable mild hyperinflation indicating COPD and/or asthma.

## 2020-06-13 DIAGNOSIS — C61 Malignant neoplasm of prostate: Secondary | ICD-10-CM | POA: Diagnosis not present

## 2020-06-20 DIAGNOSIS — N200 Calculus of kidney: Secondary | ICD-10-CM | POA: Diagnosis not present

## 2020-06-20 DIAGNOSIS — C61 Malignant neoplasm of prostate: Secondary | ICD-10-CM | POA: Diagnosis not present

## 2020-06-20 DIAGNOSIS — R1084 Generalized abdominal pain: Secondary | ICD-10-CM | POA: Diagnosis not present

## 2020-07-24 DIAGNOSIS — Z961 Presence of intraocular lens: Secondary | ICD-10-CM | POA: Diagnosis not present

## 2020-07-30 ENCOUNTER — Encounter: Payer: Self-pay | Admitting: Family Medicine

## 2020-07-30 ENCOUNTER — Other Ambulatory Visit: Payer: Self-pay

## 2020-07-30 ENCOUNTER — Ambulatory Visit (INDEPENDENT_AMBULATORY_CARE_PROVIDER_SITE_OTHER): Payer: Medicare Other | Admitting: Family Medicine

## 2020-07-30 VITALS — BP 130/70 | HR 68 | Temp 97.9°F | Ht 67.0 in | Wt 151.0 lb

## 2020-07-30 DIAGNOSIS — R221 Localized swelling, mass and lump, neck: Secondary | ICD-10-CM | POA: Diagnosis not present

## 2020-07-30 DIAGNOSIS — E232 Diabetes insipidus: Secondary | ICD-10-CM

## 2020-07-30 DIAGNOSIS — E7800 Pure hypercholesterolemia, unspecified: Secondary | ICD-10-CM | POA: Diagnosis not present

## 2020-07-30 DIAGNOSIS — J449 Chronic obstructive pulmonary disease, unspecified: Secondary | ICD-10-CM | POA: Diagnosis not present

## 2020-07-30 DIAGNOSIS — R7303 Prediabetes: Secondary | ICD-10-CM

## 2020-07-30 DIAGNOSIS — I10 Essential (primary) hypertension: Secondary | ICD-10-CM | POA: Diagnosis not present

## 2020-07-30 DIAGNOSIS — Z79899 Other long term (current) drug therapy: Secondary | ICD-10-CM | POA: Diagnosis not present

## 2020-07-30 DIAGNOSIS — J392 Other diseases of pharynx: Secondary | ICD-10-CM | POA: Diagnosis not present

## 2020-07-30 DIAGNOSIS — D692 Other nonthrombocytopenic purpura: Secondary | ICD-10-CM

## 2020-07-30 DIAGNOSIS — I25119 Atherosclerotic heart disease of native coronary artery with unspecified angina pectoris: Secondary | ICD-10-CM

## 2020-07-30 MED ORDER — PREDNISONE 20 MG PO TABS: ORAL_TABLET | ORAL | 0 refills | Status: DC

## 2020-07-30 NOTE — Progress Notes (Signed)
Subjective:    Patient ID: [name], male    DOB: Jun 24, 1940, 80 y.o.   MRN: [id number]  HPIhtn check up. Gets all meds from [place]. States no problems or concerns today.  Patient is having some intermittent side pain in the left lower back radiates into the left side he saw urologist they did a CAT scan did not find any kidney Ketchem.  Denies dysuria urinary frequency  He also saw GI that they tried to do an EGD because he is having some swallowing issues in he had a very tight stricture in the pharyngeal esophageal region  Denies any radiation down the arms denies any chest tightness pressure pain or shortness of breath  Has a spot on the right back of his neck that has been there for 30 years he feels like it is a little bit more prominent but he is not certain not causing him any pain  He does have some underlying metabolic issues Review of Systems  Constitutional: Negative for activity change.  HENT: Negative for congestion and rhinorrhea.   Respiratory: Negative for cough and shortness of breath.   Cardiovascular: Negative for chest pain.  Gastrointestinal: Negative for abdominal pain, diarrhea, nausea and vomiting.  Genitourinary: Negative for dysuria and hematuria.  Neurological: Negative for weakness and headaches.  Psychiatric/Behavioral: Negative for behavioral problems and confusion.       Objective:   Physical Exam Constitutional:      General: He is not in acute distress.    Appearance: He is well-developed.  HENT:     Head: Normocephalic.  Cardiovascular:     Rate and Rhythm: Normal rate and regular rhythm.     Heart sounds: Normal heart sounds. No murmur heard.   Pulmonary:     Effort: Pulmonary effort is normal.     Breath sounds: Normal breath sounds.  Skin:    General: Skin is warm and dry.  Neurological:     Mental Status: He is alert.  Psychiatric:        Behavior: Behavior normal.           Assessment & Plan:  1. Essential  hypertension Blood pressure under good control continue current measures watch diet closely stay physically active as health allows - Basic metabolic panel  2. Prediabetes Check A1c before next visit watch starches in diet stay active recheck in 5 months - Hemoglobin A1c  3. HYPERCHOLESTEROLEMIA Keep cholesterol under good control.  Watch diet check lab work await results - Lipid panel  4. High risk medication use Labs ordered - Hepatic function panel  5. Pharyngeal constriction of undetermined etiology Severe constriction noted on EGD she was sent to a specialist at [facility] who told her to see a ENT specialist who could do Botox injections to try to help.  They set him up with the ENT in [facility] but patient did not go because of distance we will check to see if this could be handled in [place]. See below  6. Neck nodule Very small nodule area on the side of the neck hard to tell if this is a lymph node it does not seem like a lymph node it seems more like a sebaceous area.  Patient states he had ever since his 22s maybe it is slightly bigger it is freely movable not hard but would need further work-up possibly we would refer to ENT Diabetes insipidus being handled by specialist taking his medication Senile purpura noted on the arms consistent for the  patient no bleeding issues COPD states breathing overall doing well Angina issues none currently medication doing a good job  Barium esophogram by Dr Henrene Pastor showed:Barium esophagram revealed tight cricopharyngeus with tiny left posterior lateral diverticulum

## 2020-07-30 NOTE — Patient Instructions (Signed)
Please do your blood work before you follow-up with me in 4 months. No orders for your blood work will be at [facility]

## 2020-08-06 NOTE — Progress Notes (Signed)
08/06/20- left message on Dr.Shoemaker assistant voicemail to return call

## 2020-08-08 NOTE — Progress Notes (Signed)
Did you speak with dr or do we need to call back

## 2020-08-20 ENCOUNTER — Telehealth: Payer: Self-pay | Admitting: Family Medicine

## 2020-08-20 NOTE — Telephone Encounter (Signed)
Discussed with pt. Pt states he would like to hold off for now on referral because his [place] dr told him someone in [place] may do this and they are looking into that for him. Advised pt to call us back if he wants the referral.

## 2020-08-20 NOTE — Telephone Encounter (Signed)
Nurses  Patient was seen on 07/30/2020.  He informed me that he was seen by GI specialist at [facility] who recommended that he see a specific ENT doctor toward [facility] to help with his swallowing issue.  We have told the patient we would look into see if this could be offered more locally.  Please inform patient that we have checked into locally.  No doctor in the [place] region does the Botox injections for this.  It would be wise for him to reconnect with the specialists over [place] to set up an appointment with the ENT.  They can further evaluate and if they feel that Botox injections are needed they can certainly do these.  Thank you (The GI doctor from [place] gave him the name of the ENT specialist in [place] they should be able to set up the appointment themselves but if they need a referral please do so) (see my last office visit note with him for more details if needed)

## 2020-08-22 NOTE — Progress Notes (Signed)
The specialist never called back but do not worry because patient is pursuing this through the [place] thank you may close

## 2020-09-22 ENCOUNTER — Telehealth: Payer: Self-pay

## 2020-09-22 NOTE — Telephone Encounter (Signed)
Pt states he has restless feet and hands. Pt states he can not stay still and unable to sleep. Numbness in right foot for a long time;some in left foot but not as bad as right foot. Please advise. Thank you (Leaving for vacation on Sunday) [place].

## 2020-09-22 NOTE — Telephone Encounter (Signed)
Pt is calling having restless feet and hand problems wants to know if Dr Nicki Reaper can call something in for him.  Pt call back [phone]

## 2020-09-22 NOTE — Telephone Encounter (Signed)
Pt is going on vacation as of this weekend for three weeks and wants to see if it can be called in before then he said to send to [place]

## 2020-09-23 NOTE — Telephone Encounter (Signed)
Pt states these issues have been going on for months but getting worse. Able to walk/drive ok but sometimes has numbness in feet. Pt will be on vacation for 3 weeks. Please advise. Thank you

## 2020-09-23 NOTE — Telephone Encounter (Signed)
Certainly sorry he is having troubles  (first of all I imagine that the symptoms have been present for a while)  How long has he had these issues Does it seem to be getting progressively worse? Is he is safely able to walk and drive? How long will he be gone on vacation Please gather additional information then I can see what I can do  (More than likely we may be able to try medication he could take at nighttime to allow him to rest but set him up for a more formal visit after his vacation)

## 2020-09-24 NOTE — Telephone Encounter (Signed)
There are no simple answers Utilizing a mild nerve pill at nighttime could be beneficial with settling some of the restlessness as well as helping him sleep Go ahead with clonazepam 0.5 mg, directions 1/2-1 nightly as needed restlessness, #24, caution drowsiness  initially he should try half a tablet at night if that does not do enough he may do a full tablet This will get him by until he has a follow-up office visit Please pend and send to me so I can sign off on it this morning

## 2020-09-25 MED ORDER — CLONAZEPAM 0.5 MG PO TABS: ORAL_TABLET | ORAL | 0 refills | Status: DC

## 2020-09-25 NOTE — Telephone Encounter (Signed)
Thank you prescription was signed

## 2020-09-25 NOTE — Telephone Encounter (Signed)
Discussed with pt. Med is pended and ready to sign

## 2020-10-07 ENCOUNTER — Telehealth: Payer: Self-pay | Admitting: Family Medicine

## 2020-10-07 NOTE — Telephone Encounter (Signed)
Left message for patient to call back and schedule Medicare Annual Wellness Visit (AWV) in office.   If not able to come in office, please offer to do virtually or by telephone.   Last AWV:10/23/2018   Please schedule at anytime with Nurse Health Advisor.

## 2020-10-10 ENCOUNTER — Ambulatory Visit: Payer: Medicare Other | Admitting: Cardiology

## 2020-10-24 DIAGNOSIS — E782 Mixed hyperlipidemia: Secondary | ICD-10-CM | POA: Diagnosis not present

## 2020-10-24 LAB — LIPID PANEL
Cholesterol: 117 mg/dL (ref <200)
HDL: 43 mg/dL (ref >=40)
LDL Cholesterol (Calc): 57 mg/dL (calc)
Non-HDL Cholesterol (Calc): 74 mg/dL (calc) (ref <130)
Total CHOL/HDL Ratio: 2.7 (calc) (ref <5.0)
Triglycerides: 85 mg/dL (ref <150)

## 2020-11-03 NOTE — Progress Notes (Addendum)
Subjective:   [name] is a 80 y.o. male who presents for Medicare Annual/Subsequent preventive examination.  I connected with Clarene Essex  today by telephone and verified that I am speaking with the correct person using two identifiers. Location patient: home Location provider: work Persons participating in the virtual visit: patient, provider.   I discussed the limitations, risks, security and privacy concerns of performing an evaluation and management service by telephone and the availability of in person appointments. I also discussed with the patient that there may be a patient responsible charge related to this service. The patient expressed understanding and verbally consented to this telephonic visit.    Interactive audio and video telecommunications were attempted between this provider and patient, however failed, due to patient having technical difficulties OR patient did not have access to video capability.  We continued and completed visit with audio only.     Review of Systems    N/A Cardiac Risk Factors include: advanced age (>93men, >23 women);male gender;hypertension;dyslipidemia     Objective:    Today's Vitals   11/04/20 0826  PainSc: 10-Worst pain ever   There is no height or weight on file to calculate BMI.  Advanced Directives 11/04/2020 09/29/2018 09/01/2018  Does Patient Have a Medical Advance Directive? No No No  Would patient like information on creating a medical advance directive? No - Patient declined No - Patient declined No - Patient declined    Current Medications (verified) Outpatient Encounter Medications as of 11/04/2020  Medication Sig   aspirin EC 81 MG tablet Take 81 mg by mouth daily.   clobetasol cream (TEMOVATE) 9.21 % Apply 1 application topically 2 (two) times daily.   clonazePAM (KLONOPIN) 0.5 MG tablet One half tablet to one whole tablet prn restlessness night use only. Caution drowsiness.   ezetimibe (ZETIA) 10 MG tablet Take 1  tablet by mouth once daily   isosorbide mononitrate (IMDUR) 30 MG 24 hr tablet Take 1 tablet (30 mg total) by mouth 2 (two) times daily.   levothyroxine (SYNTHROID, LEVOTHROID) 88 MCG tablet Take 88 mcg by mouth every other day.   metoprolol tartrate (LOPRESSOR) 25 MG tablet TAKE 1/2 (ONE-HALF) TABLET BY MOUTH TWICE DAILY -- NEEDS APPOINTMENT FOR FURTHER REFILLS   omeprazole (PRILOSEC) 20 MG capsule TAKE 1 CAPSULE BY MOUTH EVERY DAY   polyethylene glycol powder (GLYCOLAX/MIRALAX) 17 GM/SCOOP powder Take 17 g by mouth as directed.   pravastatin (PRAVACHOL) 10 MG tablet Take 1 tablet (10 mg total) by mouth daily.   sodium chloride 1 g tablet Take 1 g by mouth in the morning, at noon, and at bedtime.   Tiotropium Bromide-Olodaterol (STIOLTO RESPIMAT) 2.5-2.5 MCG/ACT AERS Inhale 2 puffs into the lungs daily.   desmopressin (DDAVP) 0.1 MG tablet Take 0.1 mg by mouth 4 (four) times daily as needed (Pt takes 3 to 4 times daily).  (Patient not taking: Reported on 11/04/2020)   nitroGLYCERIN (NITROSTAT) 0.4 MG SL tablet Place 1 tablet (0.4 mg total) under the tongue every 5 (five) minutes as needed for chest pain (Call 911 if still having chest pain after taking 3rd tablet).   [DISCONTINUED] predniSONE (DELTASONE) 20 MG tablet 2 qd for 4 days then 1 qd for 4d   Facility-Administered Encounter Medications as of 11/04/2020  Medication   0.9 %  sodium chloride infusion    Allergies (verified) Codeine, Losartan, Morphine, Other, Statins, and Tramadol   History: Past Medical History:  Diagnosis Date   Allergy    Anxiety  past hx - not current    Arthritis    CAD (coronary artery disease)    Nonobstructive CAD with the exception of a nondominant RCA that was managed medically in April 2020.   Colon polyps    Adenomatous   Constipation    uses Stool Softener every 3 days    COPD (chronic obstructive pulmonary disease) (HCC)    Depression    past hx- not current    Diverticulosis    Essential  hypertension    GERD (gastroesophageal reflux disease)    Hypercholesterolemia    Pituitary microadenoma ([place])    Status post resection 2009   Prostate cancer ([place])    Senile purpura ([place]) 10/23/2018   Thyroid disease    Past Surgical History:  Procedure Laterality Date   BACK SURGERY  1991   CHOLECYSTECTOMY  08/2007   COLONOSCOPY     LEFT HEART CATH AND CORONARY ANGIOGRAPHY N/A 09/01/2018   Procedure: LEFT HEART CATH AND CORONARY ANGIOGRAPHY;  Surgeon: Troy Sine, MD;  Location: [facility];  Service: Cardiovascular;  Laterality: N/A;   POLYPECTOMY     PROSTATE SURGERY     Removal of pituitary tumor  9/09   UPPER GASTROINTESTINAL ENDOSCOPY     Family History  Problem Relation Age of Onset   Stomach cancer Mother    Clotting disorder Father    Heart disease Father    Heart attack Father    Colon cancer Neg Hx    Diabetes Neg Hx    Colon polyps Neg Hx    Rectal cancer Neg Hx    Social History   Socioeconomic History   Marital status: Married    Spouse name: Not on file   Number of children: 2   Years of education: Not on file   Highest education level: Not on file  Occupational History   Occupation: Retired  Tobacco Use   Smoking status: Former    Pack years: 0.00    Types: Cigarettes   Smokeless tobacco: Never   Tobacco comments:    quit smoking in early 80's  Vaping Use   Vaping Use: Never used  Substance and Sexual Activity   Alcohol use: Not Currently    Alcohol/week: 0.0 standard drinks   Drug use: Not Currently   Sexual activity: Not on file  Other Topics Concern   Not on file  Social History Narrative   Married with 2 children   Right handed   12+   2-3 cups daily   Social Determinants of Health   Financial Resource Strain: Low Risk    Difficulty of Paying Living Expenses: Not hard at all  Food Insecurity: No Food Insecurity   Worried About Charity fundraiser in the Last Year: Never true   Ran Out of Food in the Last Year: Never  true  Transportation Needs: No Transportation Needs   Lack of Transportation (Medical): No   Lack of Transportation (Non-Medical): No  Physical Activity: Inactive   Days of Exercise per Week: 0 days   Minutes of Exercise per Session: 0 min  Stress: No Stress Concern Present   Feeling of Stress : Not at all  Social Connections: Socially Isolated   Frequency of Communication with Friends and Family: Once a week   Frequency of Social Gatherings with Friends and Family: Once a week   Attends Religious Services: Never   Marine scientist or Organizations: No   Attends Archivist Meetings: Never  Marital Status: Married    Tobacco Counseling Counseling given: Not Answered Tobacco comments: quit smoking in early 80's   Clinical Intake:  Pre-visit preparation completed: Yes  Pain : 0-10 Pain Score: 10-Worst pain ever Pain Type: Chronic pain Pain Location: Hand Pain Orientation: Right, Left Pain Descriptors / Indicators: Aching Pain Onset: More than a month ago Pain Frequency: Intermittent     Nutritional Risks: None Diabetes: No  How often do you need to have someone help you when you read instructions, pamphlets, or other written materials from your doctor or pharmacy?: 1 - Never  Diabetic? No    Interpreter Needed?: No  Information entered by :: [place] of Daily Living In your present state of health, do you have any difficulty performing the following activities: 11/04/2020  Hearing? Y  Comment Patient has hearing aids  Vision? N  Difficulty concentrating or making decisions? N  Walking or climbing stairs? N  Dressing or bathing? N  Doing errands, shopping? N  Preparing Food and eating ? N  Using the Toilet? N  In the past six months, have you accidently leaked urine? Y  Do you have problems with loss of bowel control? N  Managing your Medications? N  Managing your Finances? N  Housekeeping or managing your Housekeeping? N   Some recent data might be hidden    Patient Care Team: [name], MD as PCP - General (Family Medicine) Satira Sark, MD as PCP - Cardiology (Cardiology)  Indicate any recent Medical Services you may have received from other than [facility] providers in the past year (date may be approximate).     Assessment:   This is a routine wellness examination for [name].  Hearing/Vision screen Vision Screening - Comments:: Patient stated gets eye exams once per year.   Dietary issues and exercise activities discussed: Current Exercise Habits: The patient does not participate in regular exercise at present, Exercise limited by: None identified   Goals Addressed             This Visit's Progress    Patient Stated       I would like to maintain my current my health        Depression Screen PHQ 2/9 Scores 11/04/2020 07/30/2020 04/01/2020 09/13/2017 09/06/2016 07/15/2014  PHQ - 2 Score 0 0 0 1 0 0  PHQ- 9 Score - - - 7 - -    Fall Risk Fall Risk  11/04/2020 07/30/2020 04/01/2020 07/31/2019 05/02/2019  Falls in the past year? 0 0 0 0 0  Number falls in past yr: 0 - - - -  Injury with Fall? 0 - - - -  Risk for fall due to : No Fall Risks - - - -  Follow up Falls evaluation completed;Falls prevention discussed Falls evaluation completed Falls evaluation completed - Falls evaluation completed    FALL RISK PREVENTION PERTAINING TO THE HOME:  Any stairs in or around the home? Yes  If so, are there any without handrails? No  Home free of loose throw rugs in walkways, pet beds, electrical cords, etc? Yes  Adequate lighting in your home to reduce risk of falls? Yes   ASSISTIVE DEVICES UTILIZED TO PREVENT FALLS:  Life alert? No  Use of a cane, walker or w/c? No  Grab bars in the bathroom? Yes  Shower chair or bench in shower? No  Elevated toilet seat or a handicapped toilet? No     Cognitive Function:  Normal cognitive status  assessed by direct observation by this Nurse Health  Advisor. No abnormalities found.        Immunizations Immunization History  Administered Date(s) Administered   DT (Pediatric) 07/10/2013   Influenza, High Dose Seasonal PF 02/08/2016, 03/10/2018, 01/30/2019   Influenza, Seasonal, Injecte, Preservative Fre 02/18/2016   Influenza,inj,Quad PF,6+ Mos 02/01/2014, 03/05/2015, 03/09/2017   Influenza-Unspecified 03/10/2018, 01/30/2019, 01/09/2020, 02/01/2020   Moderna Sars-Covid-2 Vaccination 05/22/2019, 06/22/2019, 03/20/2020, 03/31/2020   Pneumococcal Conjugate-13 07/15/2014   Pneumococcal Polysaccharide-23 07/10/2013   Tdap 10/23/2018   Zoster Recombinat (Shingrix) 01/01/2019, 03/27/2019    TDAP status: Up to date  Flu Vaccine status: Up to date  Pneumococcal vaccine status: Up to date  Covid-19 vaccine status: Completed vaccines  Qualifies for Shingles Vaccine? Yes   Zostavax completed No   Shingrix Completed?: Yes  Screening Tests Health Maintenance  Topic Date Due   URINE MICROALBUMIN  Never done   Hepatitis C Screening  Never done   COVID-19 Vaccine (5 - Booster for Moderna series) 07/29/2020   INFLUENZA VACCINE  12/08/2020   TETANUS/TDAP  10/22/2028   PNA vac Low Risk Adult  Completed   Zoster Vaccines- Shingrix  Completed   HPV VACCINES  Aged Out    Health Maintenance  Health Maintenance Due  Topic Date Due   URINE MICROALBUMIN  Never done   Hepatitis C Screening  Never done   COVID-19 Vaccine (5 - Booster for Moderna series) 07/29/2020    Colorectal cancer screening: Type of screening: Colonoscopy. Completed 08/22/2013. Repeat every 10 years  Lung Cancer Screening: (Low Dose CT Chest recommended if Age 71-80 years, 30 pack-year currently smoking OR have quit w/in 15years.) does not qualify.   Lung Cancer Screening Referral: N/A   Additional Screening:  Hepatitis C Screening: does qualify;   Vision Screening: Recommended annual ophthalmology exams for early detection of glaucoma and other disorders of  the eye. Is the patient up to date with their annual eye exam?  Yes  Who is the provider or what is the name of the office in which the patient attends annual eye exams? Dr. Gershon Crane  If pt is not established with a provider, would they like to be referred to a provider to establish care? No .   Dental Screening: Recommended annual dental exams for proper oral hygiene  Community Resource Referral / Chronic Care Management: CRR required this visit?  No   CCM required this visit?  No      Plan:     I have personally reviewed and noted the following in the patient's chart:   Medical and social history Use of alcohol, tobacco or illicit drugs  Current medications and supplements including opioid prescriptions. Patient is not currently taking opioid prescriptions. Functional ability and status Nutritional status Physical activity Advanced directives List of other physicians Hospitalizations, surgeries, and ER visits in previous 12 months Vitals Screenings to include cognitive, depression, and falls Referrals and appointments  In addition, I have reviewed and discussed with patient certain preventive protocols, quality metrics, and best practice recommendations. A written personalized care plan for preventive services as well as general preventive health recommendations were provided to patient.     Ofilia Neas, LPN   [id number]/70/4888   Nurse Notes: None  I have reviewed and agree with the above annual wellness visit documentation Sallee Lange MD [facility]

## 2020-11-04 ENCOUNTER — Ambulatory Visit (INDEPENDENT_AMBULATORY_CARE_PROVIDER_SITE_OTHER): Payer: Medicare Other

## 2020-11-04 ENCOUNTER — Other Ambulatory Visit: Payer: Self-pay

## 2020-11-04 DIAGNOSIS — Z0000 Encounter for general adult medical examination without abnormal findings: Secondary | ICD-10-CM

## 2020-11-04 NOTE — Patient Instructions (Signed)
[name] , Thank you for taking time to come for your Medicare Wellness Visit. I appreciate your ongoing commitment to your health goals. Please review the following plan we discussed and let me know if I can assist you in the future.   Screening recommendations/referrals: Colonoscopy: Up to date, next due 08/23/2023 Recommended yearly ophthalmology/optometry visit for glaucoma screening and checkup Recommended yearly dental visit for hygiene and checkup  Vaccinations: Influenza vaccine: Up to date, next  due fall 2022 Pneumococcal vaccine: Completed series  Tdap vaccine: Up to date, next due 10/22/2028 Shingles vaccine: Completed series     Advanced directives: Advance directive discussed with you today. Even though you declined this today please call our office should you change your mind and we can give you the proper paperwork for you to fill out.   Conditions/risks identified: None   Next appointment: None   Preventive Care 78 Years and Older, Male Preventive care refers to lifestyle choices and visits with your health care provider that can promote health and wellness. What does preventive care include? A yearly physical exam. This is also called an annual well check. Dental exams once or twice a year. Routine eye exams. Ask your health care provider how often you should have your eyes checked. Personal lifestyle choices, including: Daily care of your teeth and gums. Regular physical activity. Eating a healthy diet. Avoiding tobacco and drug use. Limiting alcohol use. Practicing safe sex. Taking low doses of aspirin every day. Taking vitamin and mineral supplements as recommended by your health care provider. What happens during an annual well check? The services and screenings done by your health care provider during your annual well check will depend on your age, overall health, lifestyle risk factors, and family history of disease. Counseling  Your health care  provider may ask you questions about your: Alcohol use. Tobacco use. Drug use. Emotional well-being. Home and relationship well-being. Sexual activity. Eating habits. History of falls. Memory and ability to understand (cognition). Work and work Statistician. Screening  You may have the following tests or measurements: Height, weight, and BMI. Blood pressure. Lipid and cholesterol levels. These may be checked every 5 years, or more frequently if you are over 2 years old. Skin check. Lung cancer screening. You may have this screening every year starting at age 78 if you have a 30-pack-year history of smoking and currently smoke or have quit within the past 15 years. Fecal occult blood test (FOBT) of the stool. You may have this test every year starting at age 78. Flexible sigmoidoscopy or colonoscopy. You may have a sigmoidoscopy every 5 years or a colonoscopy every 10 years starting at age 87. Prostate cancer screening. Recommendations will vary depending on your family history and other risks. Hepatitis C blood test. Hepatitis B blood test. Sexually transmitted disease (STD) testing. Diabetes screening. This is done by checking your blood sugar (glucose) after you have not eaten for a while (fasting). You may have this done every 1-3 years. Abdominal aortic aneurysm (AAA) screening. You may need this if you are a current or former smoker. Osteoporosis. You may be screened starting at age 78 if you are at high risk. Talk with your health care provider about your test results, treatment options, and if necessary, the need for more tests. Vaccines  Your health care provider may recommend certain vaccines, such as: Influenza vaccine. This is recommended every year. Tetanus, diphtheria, and acellular pertussis (Tdap, Td) vaccine. You may need a Td booster every 10  years. Zoster vaccine. You may need this after age 78. Pneumococcal 13-valent conjugate (PCV13) vaccine. One dose is  recommended after age 15. Pneumococcal polysaccharide (PPSV23) vaccine. One dose is recommended after age 78. Talk to your health care provider about which screenings and vaccines you need and how often you need them. This information is not intended to replace advice given to you by your health care provider. Make sure you discuss any questions you have with your health care provider. Document Released: 05/23/2015 Document Revised: 01/14/2016 Document Reviewed: 02/25/2015 Elsevier Interactive Patient Education  2017 [place] Prevention in the Home Falls can cause injuries. They can happen to people of all ages. There are many things you can do to make your home safe and to help prevent falls. What can I do on the outside of my home? Regularly fix the edges of walkways and driveways and fix any cracks. Remove anything that might make you trip as you walk through a door, such as a raised step or threshold. Trim any bushes or trees on the path to your home. Use bright outdoor lighting. Clear any walking paths of anything that might make someone trip, such as rocks or tools. Regularly check to see if handrails are loose or broken. Make sure that both sides of any steps have handrails. Any raised decks and porches should have guardrails on the edges. Have any leaves, snow, or ice cleared regularly. Use sand or salt on walking paths during winter. Clean up any spills in your garage right away. This includes oil or grease spills. What can I do in the bathroom? Use night lights. Install grab bars by the toilet and in the tub and shower. Do not use towel bars as grab bars. Use non-skid mats or decals in the tub or shower. If you need to sit down in the shower, use a plastic, non-slip stool. Keep the floor dry. Clean up any water that spills on the floor as soon as it happens. Remove soap buildup in the tub or shower regularly. Attach bath mats securely with double-sided non-slip rug  tape. Do not have throw rugs and other things on the floor that can make you trip. What can I do in the bedroom? Use night lights. Make sure that you have a light by your bed that is easy to reach. Do not use any sheets or blankets that are too big for your bed. They should not hang down onto the floor. Have a firm chair that has side arms. You can use this for support while you get dressed. Do not have throw rugs and other things on the floor that can make you trip. What can I do in the kitchen? Clean up any spills right away. Avoid walking on wet floors. Keep items that you use a lot in easy-to-reach places. If you need to reach something above you, use a strong step stool that has a grab bar. Keep electrical cords out of the way. Do not use floor polish or wax that makes floors slippery. If you must use wax, use non-skid floor wax. Do not have throw rugs and other things on the floor that can make you trip. What can I do with my stairs? Do not leave any items on the stairs. Make sure that there are handrails on both sides of the stairs and use them. Fix handrails that are broken or loose. Make sure that handrails are as long as the stairways. Check any carpeting to make sure  that it is firmly attached to the stairs. Fix any carpet that is loose or worn. Avoid having throw rugs at the top or bottom of the stairs. If you do have throw rugs, attach them to the floor with carpet tape. Make sure that you have a light switch at the top of the stairs and the bottom of the stairs. If you do not have them, ask someone to add them for you. What else can I do to help prevent falls? Wear shoes that: Do not have high heels. Have rubber bottoms. Are comfortable and fit you well. Are closed at the toe. Do not wear sandals. If you use a stepladder: Make sure that it is fully opened. Do not climb a closed stepladder. Make sure that both sides of the stepladder are locked into place. Ask someone to  hold it for you, if possible. Clearly mark and make sure that you can see: Any grab bars or handrails. First and last steps. Where the edge of each step is. Use tools that help you move around (mobility aids) if they are needed. These include: Canes. Walkers. Scooters. Crutches. Turn on the lights when you go into a dark area. Replace any light bulbs as soon as they burn out. Set up your furniture so you have a clear path. Avoid moving your furniture around. If any of your floors are uneven, fix them. If there are any pets around you, be aware of where they are. Review your medicines with your doctor. Some medicines can make you feel dizzy. This can increase your chance of falling. Ask your doctor what other things that you can do to help prevent falls. This information is not intended to replace advice given to you by your health care provider. Make sure you discuss any questions you have with your health care provider. Document Released: 02/20/2009 Document Revised: 10/02/2015 Document Reviewed: 05/31/2014 Elsevier Interactive Patient Education  2017 [employer].

## 2020-11-16 DIAGNOSIS — Z20822 Contact with and (suspected) exposure to covid-19: Secondary | ICD-10-CM | POA: Diagnosis not present

## 2020-11-19 ENCOUNTER — Telehealth: Payer: Self-pay | Admitting: Family Medicine

## 2020-11-19 DIAGNOSIS — E039 Hypothyroidism, unspecified: Secondary | ICD-10-CM

## 2020-11-19 DIAGNOSIS — R7303 Prediabetes: Secondary | ICD-10-CM

## 2020-11-19 DIAGNOSIS — E7800 Pure hypercholesterolemia, unspecified: Secondary | ICD-10-CM

## 2020-11-19 DIAGNOSIS — Z79899 Other long term (current) drug therapy: Secondary | ICD-10-CM

## 2020-11-19 DIAGNOSIS — I10 Essential (primary) hypertension: Secondary | ICD-10-CM

## 2020-11-19 NOTE — Telephone Encounter (Signed)
Pt is wondering if he needs to have blood work before his app on 12/01/2020

## 2020-11-19 NOTE — Telephone Encounter (Signed)
Metabolic 7, liver, TSH, G7F  Prediabetes, fasting hyperglycemia, high risk medication, hypothyroidism

## 2020-11-19 NOTE — Telephone Encounter (Signed)
Blood work ordered in Epic. Patient notified. 

## 2020-11-24 DIAGNOSIS — E039 Hypothyroidism, unspecified: Secondary | ICD-10-CM | POA: Diagnosis not present

## 2020-11-24 DIAGNOSIS — Z79899 Other long term (current) drug therapy: Secondary | ICD-10-CM | POA: Diagnosis not present

## 2020-11-24 DIAGNOSIS — R7303 Prediabetes: Secondary | ICD-10-CM | POA: Diagnosis not present

## 2020-11-24 DIAGNOSIS — I10 Essential (primary) hypertension: Secondary | ICD-10-CM | POA: Diagnosis not present

## 2020-11-25 ENCOUNTER — Encounter: Payer: Self-pay | Admitting: Cardiology

## 2020-11-25 LAB — HEMOGLOBIN A1C
Est. average glucose Bld gHb Est-mCnc: 126 mg/dL
Hgb A1c MFr Bld: 6 % — ABNORMAL HIGH (ref 4.8–5.6)

## 2020-11-25 LAB — BASIC METABOLIC PANEL
BUN/Creatinine Ratio: 13 (ref 10–24)
BUN: 12 mg/dL (ref 8–27)
CO2: 22 mmol/L (ref 20–29)
Calcium: 9.5 mg/dL (ref 8.6–10.2)
Chloride: 92 mmol/L — ABNORMAL LOW (ref 96–106)
Creatinine, Ser: 0.96 mg/dL (ref 0.76–1.27)
Glucose: 93 mg/dL (ref 65–99)
Potassium: 4.9 mmol/L (ref 3.5–5.2)
Sodium: 132 mmol/L — ABNORMAL LOW (ref 134–144)
eGFR: 81 mL/min/{1.73_m2} (ref >59)

## 2020-11-25 LAB — HEPATIC FUNCTION PANEL
ALT: 12 IU/L (ref 0–44)
AST: 18 IU/L (ref 0–40)
Albumin: 4.3 g/dL (ref 3.7–4.7)
Alkaline Phosphatase: 86 IU/L (ref 44–121)
Bilirubin Total: 2.1 mg/dL — ABNORMAL HIGH (ref 0.0–1.2)
Bilirubin, Direct: 0.55 mg/dL — ABNORMAL HIGH (ref 0.00–0.40)
Total Protein: 7 g/dL (ref 6.0–8.5)

## 2020-11-25 LAB — TSH: TSH: 3.03 u[IU]/mL (ref 0.450–4.500)

## 2020-11-25 NOTE — Progress Notes (Signed)
Cardiology Office Note  Date: 11/26/2020   ID: [name], DOB Aug 19, 1940, MRN [id number]  PCP:  Lance Drown, MD  Cardiologist:  Lance Lesches, MD Electrophysiologist:  None   Chief Complaint  Patient presents with   Cardiac follow-up     History of Present Illness: [name] is a 80 y.o. male last seen in December 2021.  He is here for a routine visit.  He does not report any active angina at this time or nitroglycerin use.  He states that he has been taking his medications with no obvious intolerances.  Recent lab work in June revealed LDL 57 on [place].  He continues to follow with Dr. Wolfgang Garrison.  Past Medical History:  Diagnosis Date   Allergy    Arthritis    CAD (coronary artery disease)    Nonobstructive CAD with the exception of a nondominant RCA that was managed medically in April 2020.   Colon polyps    Adenomatous   Constipation    COPD (chronic obstructive pulmonary disease) ([place])    Diverticulosis    Essential hypertension    GERD (gastroesophageal reflux disease)    History of anxiety    History of depression    Hypercholesterolemia    Hypothyroidism    Pituitary microadenoma ([place])    Status post resection 2009   Prostate cancer ([place])    Senile purpura ([place]) 10/23/2018    Past Surgical History:  Procedure Laterality Date   BACK SURGERY  1991   CHOLECYSTECTOMY  08/2007   COLONOSCOPY     LEFT HEART CATH AND CORONARY ANGIOGRAPHY N/A 09/01/2018   Procedure: LEFT HEART CATH AND CORONARY ANGIOGRAPHY;  Surgeon: Lance Sine, MD;  Location: [facility];  Service: Cardiovascular;  Laterality: N/A;   POLYPECTOMY     PROSTATE SURGERY     Removal of pituitary tumor  9/09   UPPER GASTROINTESTINAL ENDOSCOPY      Current Outpatient Medications  Medication Sig Dispense Refill   aspirin EC 81 MG tablet Take 81 mg by mouth daily.     clobetasol cream (TEMOVATE) 9.35 % Apply 1 application topically 2 (two) times daily.      clonazePAM (KLONOPIN) 0.5 MG tablet One half tablet to one whole tablet prn restlessness night use only. Caution drowsiness. 24 tablet 0   desmopressin (DDAVP) 0.1 MG tablet Take 0.1 mg by mouth 4 (four) times daily as needed (Pt takes 3 to 4 times daily).     ezetimibe (ZETIA) 10 MG tablet Take 1 tablet by mouth once daily 30 tablet 0   isosorbide mononitrate (IMDUR) 30 MG 24 hr tablet Take 1 tablet (30 mg total) by mouth 2 (two) times daily. 180 tablet 3   levothyroxine (SYNTHROID, LEVOTHROID) 88 MCG tablet Take 88 mcg by mouth every other day.     metoprolol tartrate (LOPRESSOR) 25 MG tablet TAKE 1/2 (ONE-HALF) TABLET BY MOUTH TWICE DAILY -- NEEDS APPOINTMENT FOR FURTHER REFILLS 30 tablet 0   nitroGLYCERIN (NITROSTAT) 0.4 MG SL tablet Place 1 tablet (0.4 mg total) under the tongue every 5 (five) minutes as needed for chest pain (Call 911 if still having chest pain after taking 3rd tablet). 50 tablet 0   omeprazole (PRILOSEC) 20 MG capsule TAKE 1 CAPSULE BY MOUTH EVERY DAY 90 capsule 1   polyethylene glycol powder (GLYCOLAX/MIRALAX) 17 GM/SCOOP powder Take 17 g by mouth as directed.     pravastatin (PRAVACHOL) 10 MG tablet Take 1 tablet (10  mg total) by mouth daily. 90 tablet 3   sodium chloride 1 g tablet Take 1 g by mouth in the morning, at noon, and at bedtime.     Tiotropium Bromide-Olodaterol (STIOLTO RESPIMAT) 2.5-2.5 MCG/ACT AERS Inhale 2 puffs into the lungs daily. 4 g 0   Current Facility-Administered Medications  Medication Dose Route Frequency Provider Last Rate Last Admin   0.9 %  sodium chloride infusion  500 mL Intravenous Once Irene Shipper, MD       Allergies:  Codeine, Losartan, Morphine, Other, Statins, and Tramadol   ROS: Dysphagia.  No palpitations or syncope.  Physical Exam: VS:  BP (!) 124/58   Pulse 68   Ht 5\' 7"  (1.702 m)   Wt 143 lb (64.9 kg)   SpO2 96%   BMI 22.40 kg/m , BMI Body mass index is 22.4 kg/m.  Wt Readings from Last 3 Encounters:  11/26/20 143 lb  (64.9 kg)  07/30/20 151 lb (68.5 kg)  04/21/20 148 lb 12.8 oz (67.5 kg)    General: Patient appears comfortable at rest. HEENT: Conjunctiva and lids normal, wearing a mask. Neck: Supple, no elevated JVP, right carotid bruit, no thyromegaly. Lungs: Clear to auscultation, nonlabored breathing at rest. Cardiac: Regular rate and rhythm, no S3 or significant systolic murmur. Extremities: No pitting edema.  ECG:  An ECG dated 09/11/2019 was personally reviewed today and demonstrated:  Sinus bradycardia and sinus arrhythmia.  Recent Labwork: 02/19/2020: BNP 90.6; Magnesium 2.0 11/24/2020: ALT 12; AST 18; BUN 12; Creatinine, Ser 0.96; Potassium 4.9; Sodium 132; TSH 3.030     Component Value Date/Time   CHOL 117 10/24/2020 0759   CHOL 207 (H) 03/17/2018 0823   TRIG 85 10/24/2020 0759   HDL 43 10/24/2020 0759   HDL 48 03/17/2018 0823   CHOLHDL 2.7 10/24/2020 0759   VLDL 22 07/16/2013 0938   LDLCALC 57 10/24/2020 0759    Other Studies Reviewed Today:  Echocardiogram 07/03/2019:  1. Left ventricular ejection fraction, by estimation, is 55 to 60%. The  left ventricle has normal function. The left ventricle has no regional  wall motion abnormalities. Left ventricular diastolic parameters were  normal.   2. Right ventricular systolic function is normal. The right ventricular  size is normal.   3. The mitral valve is grossly normal. Trivial mitral valve  regurgitation.   4. The aortic valve is tricuspid. Aortic valve regurgitation is mild. No  aortic stenosis is present.   5. The inferior vena cava is normal in size with <50% respiratory  variability, suggesting right atrial pressure of 8 mmHg.  Assessment and Plan:  1.  Nonobstructive CAD with otherwise a stenosed, small nondominant RCA that is best managed medically.  He does not describe any worsening symptoms and continues on aspirin, Lopressor, Imdur, Pravachol, and Zetia.  No changes were made today.  2.  Right carotid bruit.   Carotid Dopplers in 2015 demonstrated mild bilateral atherosclerosis without stenosis.  Follow-up study will be obtained.  3.  Mixed hyperlipidemia, on Pravachol and Zetia with recent LDL 57.  Medication Adjustments/Labs and Tests Ordered: Current medicines are reviewed at length with the patient today.  Concerns regarding medicines are outlined above.   Tests Ordered: Orders Placed This Encounter  Procedures   US Carotid Duplex Bilateral   EKG 12-Lead     Medication Changes: No orders of the defined types were placed in this encounter.   Disposition:  Follow up  1 year.  Signed, Satira Sark, MD, [facility]  11/26/2020 1:12 PM    [facility] at [facility] [address], [place], [place] [address] Phone: [phone]; Fax: [phone]

## 2020-11-26 ENCOUNTER — Ambulatory Visit (INDEPENDENT_AMBULATORY_CARE_PROVIDER_SITE_OTHER): Payer: Medicare Other | Admitting: Cardiology

## 2020-11-26 ENCOUNTER — Other Ambulatory Visit: Payer: Self-pay

## 2020-11-26 ENCOUNTER — Encounter: Payer: Self-pay | Admitting: Cardiology

## 2020-11-26 VITALS — BP 124/58 | HR 68 | Ht 67.0 in | Wt 143.0 lb

## 2020-11-26 DIAGNOSIS — R0989 Other specified symptoms and signs involving the circulatory and respiratory systems: Secondary | ICD-10-CM

## 2020-11-26 DIAGNOSIS — E782 Mixed hyperlipidemia: Secondary | ICD-10-CM | POA: Diagnosis not present

## 2020-11-26 DIAGNOSIS — I25119 Atherosclerotic heart disease of native coronary artery with unspecified angina pectoris: Secondary | ICD-10-CM | POA: Diagnosis not present

## 2020-11-26 NOTE — Patient Instructions (Signed)
Medication Instructions:  Your physician recommends that you continue on your current medications as directed. Please refer to the Current Medication list given to you today.  *If you need a refill on your cardiac medications before your next appointment, please call your pharmacy*   Lab Work: None today  If you have labs (blood work) drawn today and your tests are completely normal, you will receive your results only by: [place] (if you have MyChart) OR A paper copy in the mail If you have any lab test that is abnormal or we need to change your treatment, we will call you to review the results.   Testing/Procedures: Your physician has requested that you have a carotid duplex. This test is an ultrasound of the carotid arteries in your neck. It looks at blood flow through these arteries that supply the brain with blood. Allow one hour for this exam. There are no restrictions or special instructions.    Follow-Up: At [facility], you and your health needs are our priority.  As part of our continuing mission to provide you with exceptional heart care, we have created designated Provider Care Teams.  These Care Teams include your primary Cardiologist (physician) and Advanced Practice Providers (APPs -  Physician Assistants and Nurse Practitioners) who all work together to provide you with the care you need, when you need it.  We recommend signing up for the patient portal called "MyChart".  Sign up information is provided on this After Visit Summary.  MyChart is used to connect with patients for Virtual Visits (Telemedicine).  Patients are able to view lab/test results, encounter notes, upcoming appointments, etc.  Non-urgent messages can be sent to your provider as well.   To learn more about what you can do with MyChart, go to [email or web address].    Your next appointment:   12 month(s)  The format for your next appointment:   In Person  Provider:   Rozann Lesches,  MD   Other Instructions None

## 2020-11-27 ENCOUNTER — Ambulatory Visit: Payer: Medicare Other | Admitting: Family Medicine

## 2020-12-01 ENCOUNTER — Ambulatory Visit (INDEPENDENT_AMBULATORY_CARE_PROVIDER_SITE_OTHER): Payer: Medicare Other | Admitting: Family Medicine

## 2020-12-01 ENCOUNTER — Other Ambulatory Visit: Payer: Self-pay

## 2020-12-01 VITALS — BP 122/60 | Temp 97.5°F | Wt 146.0 lb

## 2020-12-01 DIAGNOSIS — R7303 Prediabetes: Secondary | ICD-10-CM | POA: Diagnosis not present

## 2020-12-01 DIAGNOSIS — I10 Essential (primary) hypertension: Secondary | ICD-10-CM | POA: Diagnosis not present

## 2020-12-01 DIAGNOSIS — I25119 Atherosclerotic heart disease of native coronary artery with unspecified angina pectoris: Secondary | ICD-10-CM | POA: Diagnosis not present

## 2020-12-01 DIAGNOSIS — R200 Anesthesia of skin: Secondary | ICD-10-CM | POA: Diagnosis not present

## 2020-12-01 DIAGNOSIS — M778 Other enthesopathies, not elsewhere classified: Secondary | ICD-10-CM | POA: Diagnosis not present

## 2020-12-01 DIAGNOSIS — M79671 Pain in right foot: Secondary | ICD-10-CM | POA: Diagnosis not present

## 2020-12-01 DIAGNOSIS — Z87898 Personal history of other specified conditions: Secondary | ICD-10-CM

## 2020-12-01 DIAGNOSIS — R110 Nausea: Secondary | ICD-10-CM

## 2020-12-01 DIAGNOSIS — R519 Headache, unspecified: Secondary | ICD-10-CM

## 2020-12-01 NOTE — Progress Notes (Signed)
Subjective:    Patient ID: [name], male    DOB: [birth year], 80 y.o.   MRN: [id number]  HPI Pt here for follow up on HTN. Pt states blood pressure has been doing well. Pt states he has had some headache for him which is unusual for him.  Patient relates over the past 2 weeks he has had 6 separate headaches which have been moderate to severe no nausea to the point of vomiting but does have nausea.  Does state it wakes him up at night.  Denies radiation down the arms.  Patient also relates foot pain bilateral but worse on the right foot than the left right foot feels numb to him previous B12 is been normal does have prediabetes denies sciatica currently  Thumbs clicking in joints. Pain from top of thumb to knuckle area. Pt states extremely painful. Hard to take top off of toothpaste-does not behave like trigger finger  Results for orders placed or performed in visit on 84/66/59  Basic metabolic panel  Result Value Ref Range   Glucose 93 65 - 99 mg/dL   BUN 12 8 - 27 mg/dL   Creatinine, Ser 0.96 0.76 - 1.27 mg/dL   eGFR 81 >59 mL/min/1.73   BUN/Creatinine Ratio 13 10 - 24   Sodium 132 (L) 134 - 144 mmol/L   Potassium 4.9 3.5 - 5.2 mmol/L   Chloride 92 (L) 96 - 106 mmol/L   CO2 22 20 - 29 mmol/L   Calcium 9.5 8.6 - 10.2 mg/dL  Hepatic function panel  Result Value Ref Range   Total Protein 7.0 6.0 - 8.5 g/dL   Albumin 4.3 3.7 - 4.7 g/dL   Bilirubin Total 2.1 (H) 0.0 - 1.2 mg/dL   Bilirubin, Direct 0.55 (H) 0.00 - 0.40 mg/dL   Alkaline Phosphatase 86 44 - 121 IU/L   AST 18 0 - 40 IU/L   ALT 12 0 - 44 IU/L  TSH  Result Value Ref Range   TSH 3.030 0.450 - 4.500 uIU/mL  Hemoglobin A1c  Result Value Ref Range   Hgb A1c MFr Bld 6.0 (H) 4.8 - 5.6 %   Est. average glucose Bld gHb Est-mCnc 126 mg/dL    Review of Systems     Objective:   Physical Exam General-in no acute distress Eyes-no discharge Lungs-respiratory rate normal, CTA CV-no murmurs,RRR Extremities skin warm  dry no edema Neuro grossly normal Behavior normal, alert        Assessment & Plan:  1. Prediabetes Healthy eating watch diet closely minimize starches  2. Essential hypertension Blood pressure good control continue current measures  3. Frequent headaches Has a history of a pituitary tumor recommend MRI of the brain last 1 was 2019 which did show remnant of the tumor.  Hopefully his headaches are related to something other than a reoccurrence - MR Brain W Wo Contrast  4. Thumb tendonitis Try Voltaren gel several times over the next couple weeks see if this helps if not orthopedics for injections  5. Nausea If becomes worse notify us we will send in Zofran otherwise small frequent meals proceed forward with MRI  6. Numbness of right foot More than likely this is a compression of the nerve related to the fat pad being any much gone in his feet Normal previous B12 A1c slightly elevated which could be small fiber damage Nerve conduction ordered - Ambulatory referral to Neurology  7. Pain in right foot Please see above do not feel x-rays indicated -  Ambulatory referral to Neurology  8. History of pituitary tumor MRI of the brain ordered - MR Brain W Wo Contrast  Will send copy of his lab work to his endocrinologist  Follow-up here within  4 months sooner problems

## 2020-12-04 ENCOUNTER — Other Ambulatory Visit: Payer: Self-pay

## 2020-12-04 ENCOUNTER — Ambulatory Visit (HOSPITAL_COMMUNITY)
Admission: RE | Admit: 2020-12-04 | Discharge: 2020-12-04 | Disposition: A | Discharge status: Home / Self Care | Payer: Medicare Other | Source: Ambulatory Visit | Attending: Cardiology | Admitting: Cardiology

## 2020-12-04 DIAGNOSIS — R0989 Other specified symptoms and signs involving the circulatory and respiratory systems: Secondary | ICD-10-CM | POA: Diagnosis not present

## 2020-12-04 DIAGNOSIS — I6523 Occlusion and stenosis of bilateral carotid arteries: Secondary | ICD-10-CM | POA: Diagnosis not present

## 2020-12-10 ENCOUNTER — Other Ambulatory Visit: Payer: Self-pay | Admitting: Family Medicine

## 2020-12-10 DIAGNOSIS — R519 Headache, unspecified: Secondary | ICD-10-CM

## 2020-12-11 ENCOUNTER — Encounter: Payer: Self-pay | Admitting: Neurology

## 2020-12-12 ENCOUNTER — Ambulatory Visit (HOSPITAL_COMMUNITY): Payer: Medicare Other

## 2020-12-15 ENCOUNTER — Other Ambulatory Visit: Payer: Self-pay

## 2020-12-15 ENCOUNTER — Ambulatory Visit (HOSPITAL_COMMUNITY)
Admission: RE | Admit: 2020-12-15 | Discharge: 2020-12-15 | Disposition: A | Discharge status: Home / Self Care | Payer: Medicare Other | Source: Ambulatory Visit | Attending: Family Medicine | Admitting: Family Medicine

## 2020-12-15 DIAGNOSIS — R519 Headache, unspecified: Secondary | ICD-10-CM | POA: Insufficient documentation

## 2020-12-15 DIAGNOSIS — Z87898 Personal history of other specified conditions: Secondary | ICD-10-CM | POA: Diagnosis not present

## 2020-12-15 MED ORDER — GADOBUTROL 1 MMOL/ML IV SOLN
7.0000 mL | Freq: Once | INTRAVENOUS | Status: AC | PRN
  Administered 2020-12-15: 7 mL via INTRAVENOUS

## 2020-12-19 ENCOUNTER — Other Ambulatory Visit: Payer: Self-pay

## 2020-12-19 DIAGNOSIS — R519 Headache, unspecified: Secondary | ICD-10-CM

## 2020-12-19 DIAGNOSIS — Z87898 Personal history of other specified conditions: Secondary | ICD-10-CM

## 2020-12-22 ENCOUNTER — Telehealth: Payer: Self-pay | Admitting: Family Medicine

## 2020-12-22 MED ORDER — DICLOFENAC SODIUM 50 MG PO TBEC: DELAYED_RELEASE_TABLET | ORAL | 0 refills | Status: DC

## 2020-12-22 NOTE — Addendum Note (Signed)
Addended by: Anastasio Auerbach R on: 12/22/2020 05:04 PM   Modules accepted: Orders

## 2020-12-22 NOTE — Telephone Encounter (Signed)
If you would like to use a short course of anti-inflammatories this can be utilized but long-term use not advisable because of increased risk of developing ulcers as well as the stress on kidney function. If the anti-inflammatory does not do enough or he has ongoing troubles the next step would be for him to let us know and we will help set him up with a orthopedist  If patient would like to use anti-inflammatory I recommend diclofenac 50 mg 1 twice daily as needed, #14, take with a snack

## 2020-12-22 NOTE — Telephone Encounter (Signed)
Please advise. Thank you

## 2020-12-22 NOTE — Telephone Encounter (Signed)
Patient has been informed per drs notes and recommendations, he agrees to diclofenac sent to pharmacy

## 2020-12-22 NOTE — Telephone Encounter (Signed)
Patient was seen 12/01/20 for thumb tendonitis and states current cream not helping can you call in  something else to [facility]

## 2020-12-26 ENCOUNTER — Telehealth: Payer: Self-pay | Admitting: Family Medicine

## 2020-12-26 ENCOUNTER — Other Ambulatory Visit: Payer: Self-pay

## 2020-12-26 ENCOUNTER — Other Ambulatory Visit: Payer: Self-pay | Admitting: Family Medicine

## 2020-12-26 DIAGNOSIS — M778 Other enthesopathies, not elsewhere classified: Secondary | ICD-10-CM

## 2020-12-26 MED ORDER — MELOXICAM 7.5 MG PO TABS: 7.5000 mg | ORAL_TABLET | Freq: Every day | ORAL | 0 refills | Status: DC

## 2020-12-26 NOTE — Telephone Encounter (Signed)
He may want to give his stomach rest for a couple days to make sure his stomach is feeling fine before trying a new medicine  Then May try Mobic 7.5 take 1 daily, #14 If that bothers his stomach he is to stop it  I would recommend that patient highly consider seeing orthopedist they may need to do some injections.  He may have referral Follow-up if ongoing issues

## 2020-12-26 NOTE — Telephone Encounter (Signed)
Patient is wanting something different called in for his thumbs last medication is making him sick on stomach so he stopped and wanting different called into [place]

## 2020-12-26 NOTE — Telephone Encounter (Signed)
Patient has been advised per drs orders , agrees to referral placed.

## 2021-01-02 DIAGNOSIS — D352 Benign neoplasm of pituitary gland: Secondary | ICD-10-CM | POA: Diagnosis not present

## 2021-01-02 DIAGNOSIS — E032 Hypothyroidism due to medicaments and other exogenous substances: Secondary | ICD-10-CM | POA: Diagnosis not present

## 2021-01-02 DIAGNOSIS — D443 Neoplasm of uncertain behavior of pituitary gland: Secondary | ICD-10-CM | POA: Diagnosis not present

## 2021-01-05 ENCOUNTER — Telehealth: Payer: Self-pay | Admitting: Family Medicine

## 2021-01-05 NOTE — Telephone Encounter (Signed)
Nurses Patient was seen by neurosurgery Dr. Earle Gell on August 26 They recommend a repeat MRI of the brain in 2 years Please put into the reminder file for MRI of the brain with and without contrast in August 2024 for pituitary microadenoma Thank you

## 2021-01-06 DIAGNOSIS — M65311 Trigger thumb, right thumb: Secondary | ICD-10-CM | POA: Diagnosis not present

## 2021-01-06 DIAGNOSIS — M79641 Pain in right hand: Secondary | ICD-10-CM | POA: Diagnosis not present

## 2021-01-06 DIAGNOSIS — M720 Palmar fascial fibromatosis [Dupuytren]: Secondary | ICD-10-CM | POA: Diagnosis not present

## 2021-01-06 DIAGNOSIS — M79642 Pain in left hand: Secondary | ICD-10-CM | POA: Diagnosis not present

## 2021-01-06 NOTE — Telephone Encounter (Signed)
Added to reminder file ?

## 2021-01-30 DIAGNOSIS — D443 Neoplasm of uncertain behavior of pituitary gland: Secondary | ICD-10-CM | POA: Diagnosis not present

## 2021-01-30 DIAGNOSIS — Z23 Encounter for immunization: Secondary | ICD-10-CM | POA: Diagnosis not present

## 2021-01-30 DIAGNOSIS — E232 Diabetes insipidus: Secondary | ICD-10-CM | POA: Diagnosis not present

## 2021-01-30 DIAGNOSIS — E032 Hypothyroidism due to medicaments and other exogenous substances: Secondary | ICD-10-CM | POA: Diagnosis not present

## 2021-03-05 ENCOUNTER — Telehealth: Payer: Self-pay | Admitting: Family Medicine

## 2021-03-05 NOTE — Telephone Encounter (Signed)
Patient stated that [facility] and [facility] told him he had pneumonia in his left lung. They prescribed him an antibiotic that even when he cuts it in half, it is still too large to swallow. He stated he has issues with his esophagus and swallowing. He wants to see Dr. Nicki Reaper or see about getting another antibiotic. Please advise.  [phone]

## 2021-03-08 NOTE — Telephone Encounter (Signed)
Nurses Please coordinate with the patient and his pharmacy He may go with azithromycin liquid, 500 mg on the first day and then 250 mg daily for 4 days Take with a snack (200 mg per 5 mL, pharmacy can help with the calculation)

## 2021-03-09 NOTE — Telephone Encounter (Signed)
Patient stated he did get the prescription from [place] and his condition is getting better

## 2021-03-09 NOTE — Telephone Encounter (Signed)
Left message to return call. [facility] on 03/05/21

## 2021-03-13 ENCOUNTER — Ambulatory Visit: Payer: Medicare Other | Admitting: Neurology

## 2021-03-16 ENCOUNTER — Telehealth: Payer: Self-pay | Admitting: Family Medicine

## 2021-03-16 DIAGNOSIS — R197 Diarrhea, unspecified: Secondary | ICD-10-CM | POA: Diagnosis not present

## 2021-03-16 DIAGNOSIS — J209 Acute bronchitis, unspecified: Secondary | ICD-10-CM | POA: Diagnosis not present

## 2021-03-16 DIAGNOSIS — J069 Acute upper respiratory infection, unspecified: Secondary | ICD-10-CM | POA: Diagnosis not present

## 2021-03-16 NOTE — Telephone Encounter (Signed)
Patient stated he went to the [place] for flu symptoms, was put on antibiotic that is now giving him diarrhea. He is still ha a productive cough but more concerned with the diarrhea. I referred him to urgent care but he wanted Dr. Nicki Reaper to know what was going on.   CB#  [phone]

## 2021-03-16 NOTE — Telephone Encounter (Signed)
Please touch base with patient What antibiotic is he taking? How many loose stools is he having per day?  Is he taking anything for?  Any sign of blood in his stools?  Depending on this we may be changing medications or offering additional advice or potentially setting him up for a follow-up visit tomorrow

## 2021-03-16 NOTE — Telephone Encounter (Signed)
Please advise. Thank you

## 2021-03-17 NOTE — Telephone Encounter (Signed)
Patient states was on liquid Augmentin and it gave him bad diarrhea- multiple stools a day and pink tinge when wiping from being so raw. Patient states he went to urgent yesterday and they switched him to Levaquin but he has not filled it because he doesn't want to make diarrhea worse. Patient states he took 3 Imodium last night and it helped last night and today and does not  want to make it worse.  Please advise

## 2021-03-17 NOTE — Telephone Encounter (Signed)
Originally he was being treated for pneumonia. Many individuals to have pneumonia actually will do better after 5 days of antibiotics. If he feels he is significantly improved in regards to the pneumonia symptoms I would recommend holding off the antibiotics/Levaquin because it does appear that he has been on antibiotics for at least 5 days I could do a follow-up visit with him late morning Wednesday or Friday Or late afternoon today Wednesday or Friday May use Imodium as needed

## 2021-03-18 ENCOUNTER — Encounter: Payer: Self-pay | Admitting: Family Medicine

## 2021-03-18 ENCOUNTER — Other Ambulatory Visit: Payer: Self-pay

## 2021-03-18 ENCOUNTER — Ambulatory Visit (INDEPENDENT_AMBULATORY_CARE_PROVIDER_SITE_OTHER): Payer: Medicare Other | Admitting: Family Medicine

## 2021-03-18 VITALS — Temp 98.1°F | Wt 138.4 lb

## 2021-03-18 DIAGNOSIS — I25119 Atherosclerotic heart disease of native coronary artery with unspecified angina pectoris: Secondary | ICD-10-CM | POA: Diagnosis not present

## 2021-03-18 DIAGNOSIS — R197 Diarrhea, unspecified: Secondary | ICD-10-CM

## 2021-03-18 NOTE — Telephone Encounter (Signed)
Left  Message to return call

## 2021-03-18 NOTE — Telephone Encounter (Signed)
Patient scheduled appt 03/18/21 at 4:15pm with Dr Sallee Lange for recheck on pneumonia and diarrhea

## 2021-03-18 NOTE — Progress Notes (Signed)
   Subjective:    Patient ID: [name], male    DOB: Apr 24, 1941, 80 y.o.   MRN: [id number]  HPI Pt recently diagnosed with pneumonia and [facility]. Antibiotic caused diarrhea. Pt following up with PCP.  Patient states he was diagnosed with pneumonia [facility] put [facility] on Augmentin he had profuse diarrhea at times it appeared to have blood in it then he went to urgent care for further check and they recommended that he be on Levaquin then he called [place] Review of Systems     Objective:   Physical Exam  Lungs are clear no crackles heart regular pulse normal extremities no edema  Patient does not appear toxic    Assessment & Plan:  Profuse diarrhea concerning for the possibility of C. difficile recommend testing in addition to this hold off on antibiotics  I believe his pneumonia has resolved I would not recommend additional antibiotics Recheck in 1 week's time follow-up sooner problems Lab work ordered.

## 2021-03-19 DIAGNOSIS — R197 Diarrhea, unspecified: Secondary | ICD-10-CM | POA: Diagnosis not present

## 2021-03-20 LAB — CBC WITH DIFFERENTIAL/PLATELET
Basophils Absolute: 0.1 10*3/uL (ref 0.0–0.2)
Basos: 1 %
EOS (ABSOLUTE): 0.1 10*3/uL (ref 0.0–0.4)
Eos: 2 %
Hematocrit: 45.3 % (ref 37.5–51.0)
Hemoglobin: 15.6 g/dL (ref 13.0–17.7)
Immature Grans (Abs): 0 10*3/uL (ref 0.0–0.1)
Immature Granulocytes: 0 %
Lymphocytes Absolute: 1.7 10*3/uL (ref 0.7–3.1)
Lymphs: 19 %
MCH: 30.4 pg (ref 26.6–33.0)
MCHC: 34.4 g/dL (ref 31.5–35.7)
MCV: 88 fL (ref 79–97)
Monocytes Absolute: 1.6 10*3/uL — ABNORMAL HIGH (ref 0.1–0.9)
Monocytes: 19 %
Neutrophils Absolute: 5 10*3/uL (ref 1.4–7.0)
Neutrophils: 59 %
Platelets: 209 10*3/uL (ref 150–450)
RBC: 5.13 x10E6/uL (ref 4.14–5.80)
RDW: 12.5 % (ref 11.6–15.4)
WBC: 8.6 10*3/uL (ref 3.4–10.8)

## 2021-03-20 LAB — COMPREHENSIVE METABOLIC PANEL
ALT: 16 IU/L (ref 0–44)
AST: 21 IU/L (ref 0–40)
Albumin/Globulin Ratio: 1.6 (ref 1.2–2.2)
Albumin: 4.2 g/dL (ref 3.7–4.7)
Alkaline Phosphatase: 133 IU/L — ABNORMAL HIGH (ref 44–121)
BUN/Creatinine Ratio: 12 (ref 10–24)
BUN: 12 mg/dL (ref 8–27)
Bilirubin Total: 2.4 mg/dL — ABNORMAL HIGH (ref 0.0–1.2)
CO2: 25 mmol/L (ref 20–29)
Calcium: 9.3 mg/dL (ref 8.6–10.2)
Chloride: 99 mmol/L (ref 96–106)
Creatinine, Ser: 0.99 mg/dL (ref 0.76–1.27)
Globulin, Total: 2.7 g/dL (ref 1.5–4.5)
Glucose: 111 mg/dL — ABNORMAL HIGH (ref 70–99)
Potassium: 4.2 mmol/L (ref 3.5–5.2)
Sodium: 138 mmol/L (ref 134–144)
Total Protein: 6.9 g/dL (ref 6.0–8.5)
eGFR: 78 mL/min/{1.73_m2} (ref >59)

## 2021-03-21 LAB — CLOSTRIDIUM DIFFICILE EIA: C difficile Toxins A+B, EIA: NEGATIVE

## 2021-04-02 DIAGNOSIS — Z20828 Contact with and (suspected) exposure to other viral communicable diseases: Secondary | ICD-10-CM | POA: Diagnosis not present

## 2021-04-24 ENCOUNTER — Ambulatory Visit (INDEPENDENT_AMBULATORY_CARE_PROVIDER_SITE_OTHER): Payer: Medicare Other | Admitting: Family Medicine

## 2021-04-24 ENCOUNTER — Other Ambulatory Visit: Payer: Self-pay

## 2021-04-24 ENCOUNTER — Other Ambulatory Visit (HOSPITAL_COMMUNITY)
Admission: RE | Admit: 2021-04-24 | Discharge: 2021-04-24 | Disposition: A | Discharge status: Home / Self Care | Payer: Medicare Other | Source: Ambulatory Visit | Attending: Family Medicine | Admitting: Family Medicine

## 2021-04-24 ENCOUNTER — Telehealth: Payer: Self-pay | Admitting: *Deleted

## 2021-04-24 ENCOUNTER — Ambulatory Visit (HOSPITAL_COMMUNITY)
Admission: RE | Admit: 2021-04-24 | Discharge: 2021-04-24 | Disposition: A | Discharge status: Home / Self Care | Payer: Medicare Other | Source: Ambulatory Visit | Attending: Family Medicine | Admitting: Family Medicine

## 2021-04-24 VITALS — BP 142/71 | HR 52 | Temp 97.3°F | Ht 67.0 in | Wt 140.2 lb

## 2021-04-24 DIAGNOSIS — I25119 Atherosclerotic heart disease of native coronary artery with unspecified angina pectoris: Secondary | ICD-10-CM | POA: Diagnosis not present

## 2021-04-24 DIAGNOSIS — R42 Dizziness and giddiness: Secondary | ICD-10-CM | POA: Insufficient documentation

## 2021-04-24 DIAGNOSIS — N320 Bladder-neck obstruction: Secondary | ICD-10-CM

## 2021-04-24 DIAGNOSIS — R300 Dysuria: Secondary | ICD-10-CM

## 2021-04-24 DIAGNOSIS — R39198 Other difficulties with micturition: Secondary | ICD-10-CM | POA: Diagnosis not present

## 2021-04-24 LAB — POCT URINALYSIS DIPSTICK
Spec Grav, UA: 1.015 (ref 1.010–1.025)
pH, UA: 6 (ref 5.0–8.0)

## 2021-04-24 LAB — BASIC METABOLIC PANEL
Anion gap: 7 (ref 5–15)
BUN: 12 mg/dL (ref 8–23)
CO2: 25 mmol/L (ref 22–32)
Calcium: 9.1 mg/dL (ref 8.9–10.3)
Chloride: 101 mmol/L (ref 98–111)
Creatinine, Ser: 0.81 mg/dL (ref 0.61–1.24)
GFR, Estimated: >60 mL/min (ref >60)
Glucose, Bld: 94 mg/dL (ref 70–99)
Potassium: 4.1 mmol/L (ref 3.5–5.1)
Sodium: 133 mmol/L — ABNORMAL LOW (ref 135–145)

## 2021-04-24 LAB — MAGNESIUM: Magnesium: 2.1 mg/dL (ref 1.7–2.4)

## 2021-04-24 MED ORDER — TAMSULOSIN HCL 0.4 MG PO CAPS: 0.4000 mg | ORAL_CAPSULE | Freq: Every day | ORAL | 2 refills | Status: DC

## 2021-04-24 NOTE — Progress Notes (Signed)
° °  Subjective:    Patient ID: [name], male    DOB: 09-10-1940, 80 y.o.   MRN: [id number]  HPI Pt having issues with lightheadedness and also reports not being able to urinate that well in the past 4 days. Patient states he can only pee a small amount Denies high fever chills sweats Denies abdominal pain Denies hematuria or rectal bleeding   Review of Systems     Objective:   Physical Exam  General-in no acute distress Eyes-no discharge Lungs-respiratory rate normal, CTA CV-no murmurs,RRR Extremities skin warm dry no edema Neuro grossly normal Behavior normal, alert Abd soft non tender  Large prostate soft    Assessment & Plan:  1. Dysuria Check culture - POCT Urinalysis Dipstick - Basic Metabolic Panel (BMET) - Magnesium - Urine Culture  2. Bladder outlet obstruction Pt states urine dribbling  - Basic Metabolic Panel (BMET) - Magnesium - US Pelvis Limited  3. Dizzy Encouraged eating and fluids - Basic Metabolic Panel (BMET) - Magnesium  [place] no obstruction Iuncrease fluids Flomax in evvening caution dizzy orthostasis Culture urine

## 2021-04-24 NOTE — Telephone Encounter (Signed)
Blood work and ultrasound results given to Dr Nicki Reaper. Dr Nicki Reaper states: Ultrasound is normal-no distention or obstruction and blood work shows normal kidney function. Most likely the prostate is pushing on the urethra. Recommends Flomax 0.4mg  qhs  #30 with 2 refills- drink and eat normally.  Patient notified and verbalized understanding. Prescription sent electronically to pharmacy. Urine sent for culture

## 2021-04-26 NOTE — Telephone Encounter (Signed)
Nurses-please touch base with patient on Monday.  See how his urine flow is doing currently?  We just started Flomax.  It is possible we may have to do a urology consult.

## 2021-04-27 NOTE — Telephone Encounter (Signed)
Patient advised per Dr Nicki Reaper:  would not recommend to do more than 8 to 12 ounces a day for the next week then after that hopefully just drinking enough regular fluids we will keep things going.  If he gets back to having a hard time urinating Dr Nicki Reaper would recommend that he consider utilizing the Flomax or considering having [place] refer him to a urologist   If any ongoing troubles let us know  Patient verbalized understanding and stated he has an appt scheduled in February with his urologist.

## 2021-04-27 NOTE — Telephone Encounter (Signed)
Patient stated he talked to pharmacist and they told him once he started the Flomax he wouldn't come off so he decided not to do the medication but bought a quart of Pedialyte and took it and now he is peeing a lot. Patient states he has some more Pedialyte at home and still drinking it and wants to know if you can drink too much Pedialyte because it is seeming to help.

## 2021-04-27 NOTE — Telephone Encounter (Signed)
Not sure I would do more than 8 to 12 ounces a day for the next week then after that hopefully just drinking enough regular fluids we will keep things going.  If he gets back to having a hard time urinating I would recommend that he consider utilizing the Flomax or considering having [place] refer him to a urologist  If any ongoing troubles let us know

## 2021-04-29 LAB — URINE CULTURE

## 2021-04-29 LAB — SPECIMEN STATUS REPORT

## 2021-05-05 ENCOUNTER — Ambulatory Visit: Payer: Medicare Other | Admitting: Family Medicine

## 2021-06-10 DIAGNOSIS — C61 Malignant neoplasm of prostate: Secondary | ICD-10-CM | POA: Diagnosis not present

## 2021-06-17 DIAGNOSIS — N3946 Mixed incontinence: Secondary | ICD-10-CM | POA: Diagnosis not present

## 2021-06-17 DIAGNOSIS — C61 Malignant neoplasm of prostate: Secondary | ICD-10-CM | POA: Diagnosis not present

## 2021-06-17 DIAGNOSIS — N5201 Erectile dysfunction due to arterial insufficiency: Secondary | ICD-10-CM | POA: Diagnosis not present

## 2021-06-19 DIAGNOSIS — Z20822 Contact with and (suspected) exposure to covid-19: Secondary | ICD-10-CM | POA: Diagnosis not present

## 2021-07-13 DIAGNOSIS — Z20822 Contact with and (suspected) exposure to covid-19: Secondary | ICD-10-CM | POA: Diagnosis not present

## 2021-07-28 DIAGNOSIS — Z961 Presence of intraocular lens: Secondary | ICD-10-CM | POA: Diagnosis not present

## 2021-08-24 DIAGNOSIS — Z20822 Contact with and (suspected) exposure to covid-19: Secondary | ICD-10-CM | POA: Diagnosis not present

## 2021-09-14 DIAGNOSIS — R059 Cough, unspecified: Secondary | ICD-10-CM | POA: Diagnosis not present

## 2021-09-14 DIAGNOSIS — Z20822 Contact with and (suspected) exposure to covid-19: Secondary | ICD-10-CM | POA: Diagnosis not present

## 2021-09-14 DIAGNOSIS — R051 Acute cough: Secondary | ICD-10-CM | POA: Diagnosis not present

## 2021-11-05 ENCOUNTER — Encounter: Payer: Self-pay | Admitting: Family Medicine

## 2021-11-05 ENCOUNTER — Ambulatory Visit (INDEPENDENT_AMBULATORY_CARE_PROVIDER_SITE_OTHER): Payer: Medicare Other | Admitting: Family Medicine

## 2021-11-05 VITALS — BP 138/70 | HR 65 | Temp 97.5°F | Ht 67.0 in | Wt 139.0 lb

## 2021-11-05 DIAGNOSIS — R21 Rash and other nonspecific skin eruption: Secondary | ICD-10-CM | POA: Diagnosis not present

## 2021-11-05 DIAGNOSIS — M2042 Other hammer toe(s) (acquired), left foot: Secondary | ICD-10-CM | POA: Diagnosis not present

## 2021-11-05 DIAGNOSIS — M2041 Other hammer toe(s) (acquired), right foot: Secondary | ICD-10-CM | POA: Diagnosis not present

## 2021-11-05 NOTE — Progress Notes (Signed)
   Subjective:    Patient ID: [name], male    DOB: Apr 07, 1941, 81 y.o.   MRN: [id number]  HPI Patient reports hammertoe problem - chronic concern right foot worse than left - Request referral to podiatry  Patient relates that he has bilateral hammertoes on both feet.  Causes him some pain and discomfort with certain movements.  He is interested in seeing podiatry.  Review of Systems     Objective:   Physical Exam  General-in no acute distress Eyes-no discharge Lungs-respiratory rate normal, CTA CV-no murmurs,RRR Extremities skin warm dry no edema Neuro grossly normal Behavior normal, alert Bilateral hammertoes noted on the feet  Wellness exam recommended later this year He states at times he has a hard time remembering some aspects that occurred but denies getting lost or confused we will discuss this further at his wellness    Assessment & Plan:   He is interested in knowing if he may be diabetic we talked about this but he does not want to take the risk of having A1c cost come to him so therefore [facility] Bilateral hammertoe and hammertoe issue he states that he will go ahead with seeing podiatry if we will refer we will go ahead with referral

## 2021-11-06 ENCOUNTER — Other Ambulatory Visit: Payer: Self-pay | Admitting: *Deleted

## 2021-11-06 DIAGNOSIS — M2041 Other hammer toe(s) (acquired), right foot: Secondary | ICD-10-CM

## 2021-11-06 NOTE — Progress Notes (Signed)
Referral placed.

## 2021-11-20 ENCOUNTER — Telehealth: Payer: Self-pay | Admitting: Cardiology

## 2021-11-20 NOTE — Telephone Encounter (Signed)
States that when BP was checked at [facility] it was 191/?. He was nervous feeling when the BP was checked. Denies chest pain, dizzness and states that he feels weak. Did have Echo done at [place] he has not gotten the results. No other complaints at this time. Pt will monitor BP and report readings to office.

## 2021-11-20 NOTE — Telephone Encounter (Signed)
Pt notified to monitor BP for a week and report readings. Will review meds at that time.

## 2021-11-20 NOTE — Telephone Encounter (Signed)
Pt c/o BP issue: STAT if pt c/o blurred vision, one-sided weakness or slurred speech  1. What are your last 5 BP readings? Top number was 191 unsure of the bottom number. He took it when he went to the [place]  2. Are you having any other symptoms (ex. Dizziness, headache, blurred vision, passed out)? Dizziness, sleepless night  3. What is your BP issue? Is high

## 2021-12-14 ENCOUNTER — Ambulatory Visit (INDEPENDENT_AMBULATORY_CARE_PROVIDER_SITE_OTHER): Payer: Medicare Other

## 2021-12-14 ENCOUNTER — Ambulatory Visit (INDEPENDENT_AMBULATORY_CARE_PROVIDER_SITE_OTHER): Payer: Medicare Other | Admitting: Podiatry

## 2021-12-14 DIAGNOSIS — M2042 Other hammer toe(s) (acquired), left foot: Secondary | ICD-10-CM | POA: Diagnosis not present

## 2021-12-14 DIAGNOSIS — M2041 Other hammer toe(s) (acquired), right foot: Secondary | ICD-10-CM

## 2021-12-14 MED ORDER — GABAPENTIN 100 MG PO CAPS: 100.0000 mg | ORAL_CAPSULE | Freq: Every day | ORAL | 2 refills | Status: DC

## 2021-12-14 NOTE — Progress Notes (Signed)
Chief Complaint  Patient presents with   Hammer Toe    bilateral hammertoe    HPI: 81 y.o. male presenting today for evaluation of bilateral lower extremity pain that extends up into the posterior calves and knees.  Patient states that for several years now he has had chronic pain with numbness and stinging sensation extending all the way up into the bilateral legs.  At one point he was diagnosed with PAD.  He sees his cardiologist regularly.  Patient is also a patient of the [place] and states that he was diagnosed with diabetes at the [place].  He would like to see if he can qualify for diabetic shoes and insoles.  Past Medical History:  Diagnosis Date   Allergy    Arthritis    CAD (coronary artery disease)    Nonobstructive CAD with the exception of a nondominant RCA that was managed medically in April 2020.   Colon polyps    Adenomatous   Constipation    COPD (chronic obstructive pulmonary disease) ([place])    Diverticulosis    Essential hypertension    GERD (gastroesophageal reflux disease)    History of anxiety    History of depression    Hypercholesterolemia    Hypothyroidism    Pituitary microadenoma ([place])    Status post resection 2009   Prostate cancer ([place])    Senile purpura ([place]) 10/23/2018    Past Surgical History:  Procedure Laterality Date   BACK SURGERY  1991   CHOLECYSTECTOMY  08/2007   COLONOSCOPY     LEFT HEART CATH AND CORONARY ANGIOGRAPHY N/A 09/01/2018   Procedure: LEFT HEART CATH AND CORONARY ANGIOGRAPHY;  Surgeon: Troy Sine, MD;  Location: [facility];  Service: Cardiovascular;  Laterality: N/A;   POLYPECTOMY     PROSTATE SURGERY     Removal of pituitary tumor  9/09   UPPER GASTROINTESTINAL ENDOSCOPY      Allergies  Allergen Reactions   Codeine Nausea Only   Losartan     Patient had elevated potassium and also slight elevation of creatinine-October 2017   Morphine    Mirtazapine     Severe drowsiness   Other Other (See Comments)    Body  aches intolerant to statins   Statins Other (See Comments)    Body aches intolerant to statins   Tramadol     Sweat, light-headed     Physical Exam: General: The patient is alert and oriented x3 in no acute distress.  Dermatology: Skin is cool, dry and supple bilateral lower extremities. Negative for open lesions or macerations.  Vascular: Palpable pedal pulses bilaterally both the DP and PT pulses.  Capillary refill is delayed however.  Skin is cool to touch.  VAS [place] ABI W/WO TBI 08/15/2019 Summary:  Right: Resting right ankle-brachial index is within normal range. No  evidence of significant right lower extremity arterial disease. The right  toe-brachial index is normal.   Left: Resting left ankle-brachial index is within normal range. No  evidence of significant left lower extremity arterial disease. The left  toe-brachial index is normal.   Neurological: Light touch and protective threshold grossly intact  Musculoskeletal Exam: Hammertoe contracture deformity noted to the lesser digits bilateral right greater than the left  Radiographic Exam:  Normal osseous mineralization. Joint spaces preserved. No fracture/dislocation/boney destruction.    Assessment: 1.  Chronic bilateral lower extremity pain extending proximal to the level of the knees bilateral   Plan of Care:  1. Patient evaluated.  X-Rays reviewed.  2.  Prescription for gabapentin 100 mg nightly.  Patient states that he experiences significant nocturnal pain. 3.  Patient states that he sees cardiology who manages his lower extremity circulation as well.  Continue. 4.  Prescription provided for the patient today to take to the [place] for approval of diabetic shoes and insoles 5.  Return to clinic as needed      Edrick Kins, DPM [facility]  Dr. Edrick Kins, DPM    [address], [place] [address]                Office [phone]  Fax [phone]

## 2022-01-19 ENCOUNTER — Ambulatory Visit (INDEPENDENT_AMBULATORY_CARE_PROVIDER_SITE_OTHER): Payer: Medicare Other | Admitting: Family Medicine

## 2022-01-19 ENCOUNTER — Encounter: Payer: Self-pay | Admitting: Family Medicine

## 2022-01-19 VITALS — BP 132/66 | HR 58 | Temp 97.3°F | Ht 67.0 in | Wt 138.8 lb

## 2022-01-19 DIAGNOSIS — Z23 Encounter for immunization: Secondary | ICD-10-CM

## 2022-01-19 DIAGNOSIS — Z0000 Encounter for general adult medical examination without abnormal findings: Secondary | ICD-10-CM

## 2022-01-19 DIAGNOSIS — I10 Essential (primary) hypertension: Secondary | ICD-10-CM | POA: Diagnosis not present

## 2022-01-19 DIAGNOSIS — D692 Other nonthrombocytopenic purpura: Secondary | ICD-10-CM | POA: Diagnosis not present

## 2022-01-19 NOTE — Progress Notes (Signed)
   Subjective:    Patient ID: [name], male    DOB: 18-Aug-1940, 81 y.o.   MRN: [id number]  HPI AWV- Annual Wellness Visit  The patient was seen for their annual wellness visit. The patient's past medical history, surgical history, and family history were reviewed. Pertinent vaccines were reviewed ( tetanus, pneumonia, shingles, flu) The patient's medication list was reviewed and updated.  The height and weight were entered.  BMI recorded in electronic record elsewhere  Cognitive screening was completed. Outcome of Mini - Cog: Pass   Falls /depression screening electronically recorded within record elsewhere  Current tobacco usage: none (All patients who use tobacco were given written and verbal information on quitting)  Recent listing of emergency department/hospitalizations over the past year were reviewed.  current specialist the patient sees on a regular basis: Dr.Balan; Dr.Borden, [place]   Medicare annual wellness visit patient questionnaire was reviewed.  A written screening schedule for the patient for the next 5-10 years was given. Appropriate discussion of followup regarding next visit was discussed.      Review of Systems     Objective:   Physical Exam  Lungs are clear hearts regular pulse normal He has what appears to be a early actinic keratosis right side of his neck Senile purpura      Assessment & Plan:  1. Need for vaccination Today - Flu Vaccine QUAD High Dose(Fluad)  2. Encounter for subsequent annual wellness visit (AWV) in Medicare patient  Adult wellness-complete.wellness physical was conducted today. Importance of diet and exercise were discussed in detail.  Importance of stress reduction and healthy living were discussed.  In addition to this a discussion regarding safety was also covered.  We also reviewed over immunizations and gave recommendations regarding current immunization needed for age.   In addition to this additional areas  were also touched on including: Preventative health exams needed:  Colonoscopy not indicated  Patient was advised yearly wellness exam  c3. Essential hypertension Blood pressure good control  4. Senile purpura ([place]) Noted on the arms stable  Patient states he will be getting blood work through his endocrinologist and through the [place] he will send [place] copies of this he denies any setbacks or problems currently

## 2022-01-27 DIAGNOSIS — D443 Neoplasm of uncertain behavior of pituitary gland: Secondary | ICD-10-CM | POA: Diagnosis not present

## 2022-01-27 DIAGNOSIS — E032 Hypothyroidism due to medicaments and other exogenous substances: Secondary | ICD-10-CM | POA: Diagnosis not present

## 2022-02-02 DIAGNOSIS — D443 Neoplasm of uncertain behavior of pituitary gland: Secondary | ICD-10-CM | POA: Diagnosis not present

## 2022-02-02 DIAGNOSIS — E032 Hypothyroidism due to medicaments and other exogenous substances: Secondary | ICD-10-CM | POA: Diagnosis not present

## 2022-02-02 DIAGNOSIS — E232 Diabetes insipidus: Secondary | ICD-10-CM | POA: Diagnosis not present

## 2022-02-09 DIAGNOSIS — X32XXXD Exposure to sunlight, subsequent encounter: Secondary | ICD-10-CM | POA: Diagnosis not present

## 2022-02-09 DIAGNOSIS — L570 Actinic keratosis: Secondary | ICD-10-CM | POA: Diagnosis not present

## 2022-02-09 DIAGNOSIS — L308 Other specified dermatitis: Secondary | ICD-10-CM | POA: Diagnosis not present

## 2022-02-09 DIAGNOSIS — L820 Inflamed seborrheic keratosis: Secondary | ICD-10-CM | POA: Diagnosis not present

## 2022-03-16 ENCOUNTER — Ambulatory Visit (INDEPENDENT_AMBULATORY_CARE_PROVIDER_SITE_OTHER): Payer: Medicare Other | Admitting: Nurse Practitioner

## 2022-03-16 ENCOUNTER — Other Ambulatory Visit (HOSPITAL_COMMUNITY)
Admission: RE | Admit: 2022-03-16 | Discharge: 2022-03-16 | Disposition: A | Discharge status: Home / Self Care | Payer: Medicare Other | Source: Ambulatory Visit | Attending: Nurse Practitioner | Admitting: Nurse Practitioner

## 2022-03-16 VITALS — Temp 98.6°F | Ht 67.0 in | Wt 138.0 lb

## 2022-03-16 DIAGNOSIS — I209 Angina pectoris, unspecified: Secondary | ICD-10-CM

## 2022-03-16 DIAGNOSIS — J449 Chronic obstructive pulmonary disease, unspecified: Secondary | ICD-10-CM

## 2022-03-16 DIAGNOSIS — R0602 Shortness of breath: Secondary | ICD-10-CM | POA: Insufficient documentation

## 2022-03-16 LAB — CBC WITH DIFFERENTIAL/PLATELET
Abs Immature Granulocytes: 0.03 10*3/uL (ref 0.00–0.07)
Basophils Absolute: 0.1 10*3/uL (ref 0.0–0.1)
Basophils Relative: 1 %
Eosinophils Absolute: 0.4 10*3/uL (ref 0.0–0.5)
Eosinophils Relative: 4 %
HCT: 46.1 % (ref 39.0–52.0)
Hemoglobin: 15.2 g/dL (ref 13.0–17.0)
Immature Granulocytes: 0 %
Lymphocytes Relative: 27 %
Lymphs Abs: 2.2 10*3/uL (ref 0.7–4.0)
MCH: 29.9 pg (ref 26.0–34.0)
MCHC: 33 g/dL (ref 30.0–36.0)
MCV: 90.7 fL (ref 80.0–100.0)
Monocytes Absolute: 0.8 10*3/uL (ref 0.1–1.0)
Monocytes Relative: 10 %
Neutro Abs: 4.6 10*3/uL (ref 1.7–7.7)
Neutrophils Relative %: 58 %
Platelets: 144 10*3/uL — ABNORMAL LOW (ref 150–400)
RBC: 5.08 MIL/uL (ref 4.22–5.81)
RDW: 13 % (ref 11.5–15.5)
WBC: 7.9 10*3/uL (ref 4.0–10.5)
nRBC: 0 % (ref 0.0–0.2)

## 2022-03-16 LAB — COMPREHENSIVE METABOLIC PANEL
ALT: 14 U/L (ref 0–44)
AST: 22 U/L (ref 15–41)
Albumin: 4.1 g/dL (ref 3.5–5.0)
Alkaline Phosphatase: 68 U/L (ref 38–126)
Anion gap: 8 (ref 5–15)
BUN: 16 mg/dL (ref 8–23)
CO2: 24 mmol/L (ref 22–32)
Calcium: 9 mg/dL (ref 8.9–10.3)
Chloride: 100 mmol/L (ref 98–111)
Creatinine, Ser: 0.97 mg/dL (ref 0.61–1.24)
GFR, Estimated: >60 mL/min (ref >60)
Glucose, Bld: 85 mg/dL (ref 70–99)
Potassium: 4.3 mmol/L (ref 3.5–5.1)
Sodium: 132 mmol/L — ABNORMAL LOW (ref 135–145)
Total Bilirubin: 2.6 mg/dL — ABNORMAL HIGH (ref 0.3–1.2)
Total Protein: 7.6 g/dL (ref 6.5–8.1)

## 2022-03-16 LAB — BRAIN NATRIURETIC PEPTIDE: B Natriuretic Peptide: 69 pg/mL (ref 0.0–100.0)

## 2022-03-16 LAB — TROPONIN I (HIGH SENSITIVITY): Troponin I (High Sensitivity): 3 ng/L (ref <18)

## 2022-03-16 MED ORDER — NITROGLYCERIN 0.4 MG SL SUBL: 0.4000 mg | SUBLINGUAL_TABLET | SUBLINGUAL | 0 refills | Status: AC | PRN

## 2022-03-16 MED ORDER — AZITHROMYCIN 250 MG PO TABS: ORAL_TABLET | ORAL | 0 refills | Status: AC

## 2022-03-16 MED ORDER — PREDNISONE 20 MG PO TABS: 20.0000 mg | ORAL_TABLET | Freq: Two times a day (BID) | ORAL | 0 refills | Status: AC

## 2022-03-16 NOTE — Progress Notes (Unsigned)
Subjective:    Patient ID: [name], male    DOB: [birth year], 81 y.o.   MRN: [id number]  Cough This is a new problem. The current episode started more than 1 month ago. Associated symptoms include nasal congestion.   81 year old male patient with history of unstable angina, CAD, COPD, diabetes insipidus presents to clinic today with productive cough with yellow sputum over the past month. Patient also complains of SOB, chest tightness, and dyspnea on exertion slightly worse than his baseline.  Patient also states that last week he felt some left arm pain along with chest pressure that went away. Patient states that he normally takes nitroglycerin for episodes of chest pain/pressure, but he has ran out of it.   Patient states that he also has an appointment with the [facility] on 11/20.  Patient has no other concerns today.    Review of Systems  Respiratory:  Positive for cough.        Objective:   Physical Exam Vitals reviewed.  Constitutional:      General: He is not in acute distress.    Appearance: Normal appearance. He is normal weight. He is not ill-appearing, toxic-appearing or diaphoretic.  HENT:     Head: Normocephalic and atraumatic.     Right Ear: Tympanic membrane, ear canal and external ear normal.     Left Ear: Tympanic membrane, ear canal and external ear normal.     Nose: Nose normal. No congestion or rhinorrhea.     Mouth/Throat:     Mouth: Mucous membranes are moist.     Pharynx: No oropharyngeal exudate or posterior oropharyngeal erythema.  Cardiovascular:     Rate and Rhythm: Normal rate and regular rhythm.     Pulses: Normal pulses.     Heart sounds: Normal heart sounds. No murmur heard. Pulmonary:     Effort: Pulmonary effort is normal. No respiratory distress.     Breath sounds: Rhonchi present. No wheezing.     Comments: Rhonchi heard to left lower lobe, moderately cleared with cough Abdominal:     General: Abdomen is flat. Bowel sounds are normal.  There is no distension.     Palpations: Abdomen is soft. There is no mass.     Tenderness: There is no abdominal tenderness. There is no guarding or rebound.     Hernia: No hernia is present.  Musculoskeletal:     Cervical back: Normal range of motion and neck supple. No rigidity or tenderness.     Comments: Grossly intact  Lymphadenopathy:     Cervical: No cervical adenopathy.  Skin:    General: Skin is warm.     Capillary Refill: Capillary refill takes less than 2 seconds.  Neurological:     Mental Status: He is alert.     Comments: Grossly intact  Psychiatric:        Mood and Affect: Mood normal.        Behavior: Behavior normal.           Assessment & Plan:   1. Chronic obstructive pulmonary disease, unspecified COPD type ([place]) -Suspect COPD exacerbation. - azithromycin (ZITHROMAX) 250 MG tablet; Take 2 tablets on day 1, then 1 tablet daily on days 2 through 5  Dispense: 6 tablet; Refill: 0 - predniSONE (DELTASONE) 20 MG tablet; Take 1 tablet (20 mg total) by mouth 2 (two) times daily with a meal for 5 days.  Dispense: 10 tablet; Refill: 0 -Due to patient's cardiac history we will also get stat  labs - Comprehensive metabolic panel - CBC with Differential/Platelet - Brain natriuretic peptide - Troponin I - Referral placed to pulmonology  2. SOB (shortness of breath) on exertion -Likely a pulmonology etiology however due to patient's cardiac history important to rule out cardiac etiologies - EKG 12-Lead = sinus rhythm - Comprehensive metabolic panel - CBC with Differential/Platelet - Brain natriuretic peptide - Troponin I - nitroGLYCERIN (NITROSTAT) 0.4 MG SL tablet; Place 1 tablet (0.4 mg total) under the tongue every 5 (five) minutes as needed for chest pain (Call 911 if still having chest pain after taking 3rd tablet).  Dispense: 30 tablet; Refill: 0 - Ambulatory referral to Cardiology  3. Angina pectoris ([place]) -Refilled patient's nitroglycerin - Ambulatory  referral to Cardiology  Return to clinic if symptoms do not improve or if they worsen.  Patient stated understanding.   Follow-up with PCP in 6 to 8 weeks or sooner if needed    Note:  This document was prepared using Dragon voice recognition software and may include unintentional dictation errors. Note - This record has been created using [employer].  Chart creation errors have been sought, but may not always  have been located. Such creation errors do not reflect on  the standard of medical care.

## 2022-03-18 ENCOUNTER — Encounter: Payer: Self-pay | Admitting: Nurse Practitioner

## 2022-05-06 ENCOUNTER — Ambulatory Visit (HOSPITAL_COMMUNITY)
Admission: RE | Admit: 2022-05-06 | Discharge: 2022-05-06 | Disposition: A | Discharge status: Home / Self Care | Payer: Medicare Other | Source: Ambulatory Visit | Attending: Internal Medicine | Admitting: Internal Medicine

## 2022-05-06 ENCOUNTER — Encounter: Payer: Self-pay | Admitting: Internal Medicine

## 2022-05-06 ENCOUNTER — Ambulatory Visit (INDEPENDENT_AMBULATORY_CARE_PROVIDER_SITE_OTHER): Payer: Medicare Other | Admitting: Internal Medicine

## 2022-05-06 VITALS — BP 136/78 | HR 68 | Temp 97.7°F | Ht 67.0 in | Wt 143.0 lb

## 2022-05-06 DIAGNOSIS — I209 Angina pectoris, unspecified: Secondary | ICD-10-CM | POA: Diagnosis not present

## 2022-05-06 DIAGNOSIS — R1319 Other dysphagia: Secondary | ICD-10-CM

## 2022-05-06 DIAGNOSIS — J449 Chronic obstructive pulmonary disease, unspecified: Secondary | ICD-10-CM | POA: Diagnosis not present

## 2022-05-06 DIAGNOSIS — R059 Cough, unspecified: Secondary | ICD-10-CM | POA: Diagnosis not present

## 2022-05-06 DIAGNOSIS — J439 Emphysema, unspecified: Secondary | ICD-10-CM | POA: Diagnosis not present

## 2022-05-06 MED ORDER — CEFDINIR 300 MG PO CAPS: 300.0000 mg | ORAL_CAPSULE | Freq: Two times a day (BID) | ORAL | 0 refills | Status: DC

## 2022-05-06 NOTE — Progress Notes (Signed)
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 male with medical history of coronary artery disease, hypertension, pituitary macroadenoma, and prostate cancer presenting with chest pain that started on 09/27/2018.  He states that he has chest discomfort at rest as well as with some activity.  He states that he has been doing some yard work as well as changing some tires on his wife's car without any worsening chest discomfort.  However he notes that occasionally when he takes his trash out to the sidewalk he does get short of breath and has intermittent chest discomfort.  He states that his chest discomfort has been constant since the evening of 09/27/2018.  On the morning of 09/29/2018, the patient felt short of breath and lightheaded with his continued central chest discomfort.  As result, he presented for further evaluation.  He denied any syncope.  He denied any palpitation.  The patient had recent heart catheterization on 09/01/2018 which showed an 80% stenosis in the nondominant RCA of 80%.  The patient was treated medically.  EF was 50 to 55%.  The patient was started on metoprolol, Zetia, and Imdur. In the emergency department, the patient was afebrile hemodynamically stable saturating 96% on room air.  BMP, CBC, and EKG were unremarkable.  D-dimer was negative.  CXR showed emphysema. Cardiology was consulted to assist with management.   He was seen for a chest pain rule out.  He  was seen by cardiology who suspected the pain was pleuritic in nature and had low suspicion for cardiac chest pain.  He had high resolution chest CT which showed emphysema and trace pericardial effusion and pericardial thickening.  His symptoms were improved on hospital day 1 and he was discharged with outpatient follow up.   Hospital Course:  Chest pain -Description is pleuritic in nature -High res per cards with emphysemia, pericardial thickening and trace pericardial effusion -Continue aspirin -Continue metoprolol tartrate -Continue Imdur -08/28/2018 CT angiogram chest negative for PE -D-dimer negative on 09/29/2018 -troponin negative   COPD -Patient does not have any formal PFTs -Suspect this may be partly contributing to the patient's dyspnea -He will need formal PFTs after discharge -No wheezing or signs of exacerbation presently   Hyponatremia -Secondary to DDAVP -This has been chronic -Monitor serially - consider dosing in follow up    Hypothyroidism -Continue Synthroid -Check TSH as outpatient          History of Present Illness  03/14/2019  Pulmonary/ 1st office eval/[name]  Chief Complaint  Patient presents with   Consult    Consult for SOB. He states he developed SOB back in april that has slwly gotten worse. He states he was told he had abnormal CXR.   Dyspnea:  mb downhill ok about 50 yards then one half back and stops with sob and cp  Wife's treadmill x 2 mph x 5 min flat then sob   Cough: no  Sleep: no resp symptoms SABA use: none  rec teadmill is ok but use it at 1 mph with goal of gradually building up to  30 min of walking daily as tolerated Make sure you check your oxygen saturations at highest level of activity to be sure it stays over 90%  Please remember to go to the lab department   for your tests - we will call you with the results when they are available   05/16/2019  f/u ov/[name] re: GOLD II copd / ex midline cp with known CAD and ? Pericarditis   Chief Complaint  Patient presents with   Follow-up    PFT done today. Pt has cough in the am and then at night. He is feeling some increased SOB today.   Dyspnea:  mb downhill then stops half way back usually doe to sob, freq also with cp  Cough: mostly after supper but before bed some dry cough periodically, not on max gerd rx  Sleeping: no resp problems  SABA use: none  02: none  rec Stiolto 2 puffs first thing in am daily  Omeprazole 20 mg Take 30- 60 min before your first and last meals of the day  GERD   You need to let your heart doctor know about the chest pain when you exert. Please schedule a follow up office visit in 2  weeks, sooner if needed    06/13/2019  f/u ov/[name] re: GOLD II copd with doe/ ex cp x 2018  Chief Complaint  Patient presents with   Follow-up    He states he feels that the [place] made it easier for him to breathe but it makes his nose run a little and some cough right after he first inhales medication.   Dyspnea: ? slt improved (really not sure) as  no longer does mailbox and back which is what I suggested he use as indicator.  Now does about 10 minutes on eliptical which is the same as before stiolto  and every time has midline cp and no change in this symptom either s assoc wheeze or variability or rad to arm  Cough:  Only p stiolto Sleeping: able to lie flat s resp symptoms on one pillow  SABA use: none  02: none  Rec Continue stiolto 2 pffs every day x 2 weeks and if not convinced it's helping you exert then stop it after two weeks and see if you can tell any differnce Work on inhaler technique [employer] for a follow visit regarding your chest discomfort when you walk  Pulmonary follow up is as needed     05/06/2022  f/u ov/[place] office/[name] re: GOLD 2 copd  maint on stiolto   Chief Complaint  Patient presents with   Follow-up    Breathing about the same, worse without inhaler. Coughing up gray and green sputum. Has been coughing a lot  lately and keeping him up at night. Patient declined walk test today,he didn't feel good and did not want to do walk today.    Dyspnea:  more sedentary due to hammer toes / no longer doing eliptical  Cough: x several days green mucus  with h/o asp pna c/b diarrhea from abx  Sleeping: poorly due to cough / severe dysphagia   SABA use: none  02: none  Covid status: vax x 3    Severe dysphagia [facility] rec botox but he declined    No obvious day to day or daytime variability or assoc mucus plugs  or hemoptysis or cp or chest tightness, subjective wheeze or overt sinus or hb symptoms.    . Also denies any obvious fluctuation of symptoms with weather or environmental changes or other aggravating or alleviating factors except as outlined above   No unusual exposure hx or h/o childhood pna/ asthma or knowledge of premature birth.  Current Allergies, Complete Past Medical History, Past Surgical History, Family History, and Social History were reviewed in [employer] record.  ROS  The following are not active complaints unless bolded Hoarseness, sore throat, dysphagia, dental problems, itching, sneezing,  nasal congestion or discharge of excess mucus or purulent secretions, ear ache,   fever, chills, sweats, unintended wt loss or wt gain, classically pleuritic or exertional cp,  orthopnea pnd or arm/hand swelling  or leg swelling, presyncope, palpitations, abdominal pain, anorexia, nausea, vomiting, diarrhea  or change in bowel habits or change in bladder habits, change in stools or change in urine, dysuria, hematuria,  rash, arthralgias, visual complaints, headache, numbness, weakness or ataxia or problems with walking or coordination,  change in mood or  memory.        Current Meds  Medication Sig   aspirin EC 81 MG tablet Take 81 mg by mouth daily.   cetirizine (ZYRTEC) 10 MG tablet Take 10 mg by mouth daily.   clobetasol cream (TEMOVATE) 7.51 % Apply 1 application topically 2  (two) times daily.   desmopressin (DDAVP) 0.1 MG tablet Take 0.1 mg by mouth 4 (four) times daily as needed (Pt takes 3 to 4 times daily).   ezetimibe (ZETIA) 10 MG tablet Take 1 tablet by mouth once daily   gabapentin (NEURONTIN) 100 MG capsule Take 1 capsule (100 mg total) by mouth at bedtime.   hydrOXYzine (ATARAX) 10 MG tablet Take 5 mg by mouth 3 (three) times daily as needed for itching.   isosorbide mononitrate (IMDUR) 30 MG 24 hr tablet Take 1 tablet (30 mg total) by mouth 2 (two) times daily.   levothyroxine (SYNTHROID, LEVOTHROID) 88 MCG tablet Take 88 mcg by mouth every other day.   melatonin 1 MG TABS tablet Take 1 mg by mouth at bedtime. 1/2 tablet at bedtime   meloxicam (MOBIC) 7.5 MG tablet Take 1 tablet (7.5 mg total) by mouth daily.   metoprolol tartrate (LOPRESSOR) 25 MG tablet TAKE 1/2 (ONE-HALF) TABLET BY MOUTH TWICE DAILY -- NEEDS APPOINTMENT FOR FURTHER REFILLS   nitroGLYCERIN (NITROSTAT) 0.4 MG SL tablet Place 1 tablet (0.4 mg total) under the tongue every 5 (five) minutes as needed for chest pain (Call 911 if still having chest pain after taking 3rd tablet).   omeprazole (PRILOSEC) 20 MG capsule TAKE 1 CAPSULE BY MOUTH EVERY DAY   polyethylene glycol powder (GLYCOLAX/MIRALAX) 17 GM/SCOOP powder Take 17 g by mouth as directed.   pravastatin (PRAVACHOL) 10 MG tablet Take 1 tablet (10 mg total) by mouth daily.   sodium chloride 1 g tablet Take 1 g by mouth in the morning, at noon, and at bedtime.   tamsulosin (FLOMAX) 0.4 MG CAPS capsule Take 1 capsule (0.4 mg total) by mouth at bedtime.   Tiotropium Bromide-Olodaterol (STIOLTO RESPIMAT) 2.5-2.5 MCG/ACT AERS Inhale 2 puffs into the lungs daily.                Past Medical History:  Diagnosis Date   Arthritis    Colon polyps    Adenomatous   Diverticulosis    Essential hypertension    GERD (gastroesophageal reflux disease)  Hypercholesterolemia    Pituitary microadenoma [facility])    Status post resection 2009    Prostate cancer ([place])    Senile purpura ([place]) 10/23/2018       Objective:     Wts  05/06/2022      143   06/13/2019        146   05/16/19 146 lb (66.2 kg)  03/14/19 140 lb 3.2 oz (63.6 kg)  01/08/19 144 lb (65.3 kg)    Vital signs reviewed  05/06/2022  - Note at rest 02 sats  97% on RA   General appearance:    very somber amb wm/ min congested cough      HEENT : Oropharynx  clear    NECK :  without  apparent JVD/ palpable Nodes/TM    LUNGS: no acc muscle use,  Min barrel  contour chest wall with bilateral  slightly decreased bs s audible wheeze and  without cough on insp or exp maneuvers and min  Hyperresonant  to  percussion bilaterally    CV:  RRR  no s3 or murmur or increase in P2, and no edema   ABD:  soft and nontender with pos end  insp Hoover's  in the supine position.  No bruits or organomegaly appreciated   MS:  Nl gait/ ext warm without deformities Or obvious joint restrictions  calf tenderness, cyanosis or clubbing     SKIN: warm and dry without lesions    NEURO:  alert, approp, nl sensorium with  no motor or cerebellar deficits apparent.           CXR PA and Lateral:   05/06/2022 :    I personally reviewed images and agree with radiology impression as follows:    COPD changes without acute abnormalities.  Aortic Atherosclerosis (ICD10-I70.0) and Emphysema (ICD10-J43.9).       Assessment

## 2022-05-06 NOTE — Patient Instructions (Addendum)
Omnicef 300 mg twice daily for 10 days  Return to GI doctor  > Botox recommended   Please remember to go to the  x-ray department  @  [facility] for your tests - we will call you with the results when they are available

## 2022-05-07 DIAGNOSIS — R1310 Dysphagia, unspecified: Secondary | ICD-10-CM | POA: Insufficient documentation

## 2022-05-07 NOTE — Assessment & Plan Note (Signed)
Assoc with ? Recurrent aspiration ? >  Botox rec but pt declined as of 05/06/2022   Cxr s signs of aspiration at present but sputum turning purulent  >>> rec omnicef x 10 days and proceed with GI recs asap         Each maintenance medication was reviewed in detail including emphasizing most importantly the difference between maintenance and prns and under what circumstances the prns are to be triggered using an action plan format where appropriate.  Total time for H and P, chart review, counseling, reviewing smi  device(s) and generating customized AVS unique to this office visit / same day charting = 23 min

## 2022-05-07 NOTE — Assessment & Plan Note (Signed)
Quit smoking early 1980s with cough that 100% resolved  - HRCT 09/29/2018 c/w mod emphysema s air trapping or ILD  - PFT's  05/16/2019  FEV1 1.70 (64 % ) ratio 0.55  p 6% improvement from saba p 0 prior to study with DLCO  14.74 (64%) corrects to 2.95 (73%)  for alv volume and FV curve classic concavity   - 05/16/2019     try stiolto x 2 weeks ? Better ex tol, no change midline ex cp - 05/16/2019   Walked RA x two laps =  approx 555f @ avg pace - stopped due to end of study  with sats of 93% at the end of the study s cp or sob  - 06/13/2019   rx stiolto  trial> improved - 05/06/2022  After extensive coaching inhaler device,  effectiveness =    90% with smi > contine stiolto  Pt is Group B in terms of symptom/risk and laba/lama therefore appropriate rx at this point >>>  stiolto and f/u here  can be prn refills

## 2022-05-14 ENCOUNTER — Telehealth: Payer: Self-pay | Admitting: Cardiology

## 2022-05-14 DIAGNOSIS — E782 Mixed hyperlipidemia: Secondary | ICD-10-CM

## 2022-05-14 NOTE — Telephone Encounter (Signed)
Order to [place] for lipids,pt aware

## 2022-05-14 NOTE — Telephone Encounter (Signed)
Patient called to find out if he will need to do lab work prior to his appointment with Dr. Domenic Polite on 1/10.

## 2022-05-14 NOTE — Telephone Encounter (Signed)
Last Lipids done was 10/2020  I will message Dr.McDowell

## 2022-05-17 DIAGNOSIS — E782 Mixed hyperlipidemia: Secondary | ICD-10-CM | POA: Diagnosis not present

## 2022-05-18 LAB — LIPID PANEL
Chol/HDL Ratio: 3.2 ratio (ref 0.0–5.0)
Cholesterol, Total: 147 mg/dL (ref 100–199)
HDL: 46 mg/dL (ref >39)
LDL Chol Calc (NIH): 87 mg/dL (ref 0–99)
Triglycerides: 68 mg/dL (ref 0–149)
VLDL Cholesterol Cal: 14 mg/dL (ref 5–40)

## 2022-05-19 ENCOUNTER — Ambulatory Visit: Payer: Medicare Other | Attending: Cardiology | Admitting: Cardiology

## 2022-05-19 ENCOUNTER — Encounter: Payer: Self-pay | Admitting: Cardiology

## 2022-05-19 VITALS — BP 128/58 | HR 63 | Ht 67.0 in | Wt 142.0 lb

## 2022-05-19 DIAGNOSIS — I25119 Atherosclerotic heart disease of native coronary artery with unspecified angina pectoris: Secondary | ICD-10-CM | POA: Insufficient documentation

## 2022-05-19 DIAGNOSIS — E782 Mixed hyperlipidemia: Secondary | ICD-10-CM | POA: Diagnosis not present

## 2022-05-19 MED ORDER — PRAVASTATIN SODIUM 20 MG PO TABS: 20.0000 mg | ORAL_TABLET | Freq: Every evening | ORAL | 3 refills | Status: DC

## 2022-05-19 MED ORDER — PRAVASTATIN SODIUM 20 MG PO TABS: 20.0000 mg | ORAL_TABLET | Freq: Every evening | ORAL | 3 refills | Status: AC

## 2022-05-19 NOTE — Progress Notes (Signed)
Cardiology Office Note  Date: 05/19/2022   ID: [name], DOB 01-18-1941, MRN [id number]  PCP:  [name], MD  Cardiologist:  Rozann Lesches, MD Electrophysiologist:  None   Chief Complaint  Patient presents with   Cardiac follow-up    History of Present Illness: [name] is a 82 y.o. male last seen in July 2022.  He is here for a routine visit.  Reports no angina or nitroglycerin use, stable NYHA class II dyspnea.  I reviewed his medications which are stable from a cardiac perspective.  He reports compliance with Pravachol.  His most recent LDL went up to 87 from 57.  Carotid Dopplers obtained in July 2022 showed no significant obstruction and remains asymptomatic.  Past Medical History:  Diagnosis Date   Allergy    Arthritis    CAD (coronary artery disease)    Nonobstructive CAD with the exception of a nondominant RCA that was managed medically in April 2020.   Colon polyps    Adenomatous   Constipation    COPD (chronic obstructive pulmonary disease) (HCC)    Diverticulosis    Essential hypertension    GERD (gastroesophageal reflux disease)    History of anxiety    History of depression    Hypercholesterolemia    Hypothyroidism    Pituitary microadenoma (HCC)    Status post resection 2009   Prostate cancer ([place])    Senile purpura ([place]) 10/23/2018    Current Outpatient Medications  Medication Sig Dispense Refill   aspirin EC 81 MG tablet Take 81 mg by mouth daily.     cetirizine (ZYRTEC) 10 MG tablet Take 10 mg by mouth daily.     clobetasol cream (TEMOVATE) 1.44 % Apply 1 application topically 2 (two) times daily.     desmopressin (DDAVP) 0.1 MG tablet Take 0.1 mg by mouth 4 (four) times daily as needed (Pt takes 3 to 4 times daily).     ezetimibe (ZETIA) 10 MG tablet Take 1 tablet by mouth once daily 30 tablet 0   gabapentin (NEURONTIN) 100 MG capsule Take 1 capsule (100 mg total) by mouth at bedtime. 30 capsule 2   hydrOXYzine (ATARAX) 10 MG  tablet Take 5 mg by mouth 3 (three) times daily as needed for itching.     isosorbide mononitrate (IMDUR) 30 MG 24 hr tablet Take 1 tablet (30 mg total) by mouth 2 (two) times daily. 180 tablet 3   levothyroxine (SYNTHROID, LEVOTHROID) 88 MCG tablet Take 88 mcg by mouth every other day.     melatonin 1 MG TABS tablet Take 1 mg by mouth at bedtime. 1/2 tablet at bedtime     metoprolol tartrate (LOPRESSOR) 25 MG tablet TAKE 1/2 (ONE-HALF) TABLET BY MOUTH TWICE DAILY -- NEEDS APPOINTMENT FOR FURTHER REFILLS 30 tablet 0   nitroGLYCERIN (NITROSTAT) 0.4 MG SL tablet Place 1 tablet (0.4 mg total) under the tongue every 5 (five) minutes as needed for chest pain (Call 911 if still having chest pain after taking 3rd tablet). 30 tablet 0   omeprazole (PRILOSEC) 20 MG capsule TAKE 1 CAPSULE BY MOUTH EVERY DAY 90 capsule 1   polyethylene glycol powder (GLYCOLAX/MIRALAX) 17 GM/SCOOP powder Take 17 g by mouth as directed.     sodium chloride 1 g tablet Take 1 g by mouth in the morning, at noon, and at bedtime.     tamsulosin (FLOMAX) 0.4 MG CAPS capsule Take 1 capsule (0.4 mg total) by mouth at bedtime. 30 capsule 2  Tiotropium Bromide-Olodaterol (STIOLTO RESPIMAT) 2.5-2.5 MCG/ACT AERS Inhale 2 puffs into the lungs daily. 4 g 0   pravastatin (PRAVACHOL) 20 MG tablet Take 1 tablet (20 mg total) by mouth every evening. 90 tablet 3   No current facility-administered medications for this visit.   Allergies:  Codeine, Losartan, Morphine, Mirtazapine, Other, Statins, and Tramadol   ROS: No palpitations or unexplained syncope.  Physical Exam: VS:  BP (!) 128/58   Pulse 63   Ht '5\' 7"'$  (1.702 m)   Wt 142 lb (64.4 kg)   SpO2 97%   BMI 22.24 kg/m , BMI Body mass index is 22.24 kg/m.  Wt Readings from Last 3 Encounters:  05/19/22 142 lb (64.4 kg)  05/06/22 143 lb (64.9 kg)  03/16/22 138 lb (62.6 kg)    General: Patient appears comfortable at rest. HEENT: Conjunctiva and lids normal. Neck: Supple, no  elevated JVP, right carotid bruit. Lungs: Clear to auscultation, nonlabored breathing at rest. Cardiac: Regular rate and rhythm, no S3 or significant systolic murmur. Extremities: No pitting edema.  ECG:  An ECG dated 03/16/2022 was personally reviewed today and demonstrated:  Sinus rhythm with left atrial enlargement, lead loss V6.  Recent Labwork: 03/16/2022: ALT 14; AST 22; B Natriuretic Peptide 69.0; BUN 16; Creatinine, Ser 0.97; Hemoglobin 15.2; Platelets 144; Potassium 4.3; Sodium 132     Component Value Date/Time   CHOL 147 05/17/2022 0814   TRIG 68 05/17/2022 0814   HDL 46 05/17/2022 0814   CHOLHDL 3.2 05/17/2022 0814   CHOLHDL 2.7 10/24/2020 0759   VLDL 22 07/16/2013 0938   LDLCALC 87 05/17/2022 0814   LDLCALC 57 10/24/2020 0759    Other Studies Reviewed Today:  Carotid Dopplers 12/04/2020: IMPRESSION: Bilateral carotid atherosclerosis. Negative for stenosis. Degree of narrowing less than 50% bilaterally by ultrasound criteria.   Patent antegrade vertebral flow bilaterally  Assessment and Plan:  1.  CAD, overall nonobstructive with the exception of a nondominant RCA that was managed medically and angiography in April 2020.  He reports no increasing angina or nitroglycerin use.  Continue aspirin, Imdur, Lopressor, Zetia, and Pravachol.  2.  Mixed hyperlipidemia, increase Pravachol to 20 mg daily.  Recent LDL went up to 87 from 57.  Continue Zetia.  3.  Asymptomatic carotid artery disease.  Continue aspirin and statin.  Medication Adjustments/Labs and Tests Ordered: Current medicines are reviewed at length with the patient today.  Concerns regarding medicines are outlined above.   Tests Ordered: No orders of the defined types were placed in this encounter.   Medication Changes: Meds ordered this encounter  Medications   DISCONTD: pravastatin (PRAVACHOL) 20 MG tablet    Sig: Take 1 tablet (20 mg total) by mouth every evening.    Dispense:  90 tablet    Refill:  3    pravastatin (PRAVACHOL) 20 MG tablet    Sig: Take 1 tablet (20 mg total) by mouth every evening.    Dispense:  90 tablet    Refill:  3    Disposition:  Follow up  1 year.  Signed, Satira Sark, MD, [facility] 05/19/2022 11:02 AM    [facility] [address], [place], [place] [address] Phone: [phone]; Fax: [phone]

## 2022-05-19 NOTE — Patient Instructions (Signed)
Medication Instructions:  Your physician has recommended you make the following change in your medication:  -Increase Pravastatin (Pravachol) to 20 mg tablets daily   Labwork: None  Testing/Procedures: None  Follow-Up: Follow up with Dr. Domenic Polite in 1 year.   Any Other Special Instructions Will Be Listed Below (If Applicable).     If you need a refill on your cardiac medications before your next appointment, please call your pharmacy.

## 2022-06-14 DIAGNOSIS — U071 COVID-19: Secondary | ICD-10-CM | POA: Diagnosis not present

## 2022-06-15 DIAGNOSIS — C61 Malignant neoplasm of prostate: Secondary | ICD-10-CM | POA: Diagnosis not present

## 2022-06-23 DIAGNOSIS — N3946 Mixed incontinence: Secondary | ICD-10-CM | POA: Diagnosis not present

## 2022-06-23 DIAGNOSIS — C61 Malignant neoplasm of prostate: Secondary | ICD-10-CM | POA: Diagnosis not present

## 2022-07-21 ENCOUNTER — Ambulatory Visit: Payer: Medicare Other | Admitting: Family Medicine

## 2022-07-28 ENCOUNTER — Ambulatory Visit (INDEPENDENT_AMBULATORY_CARE_PROVIDER_SITE_OTHER): Payer: Medicare Other | Admitting: Family Medicine

## 2022-07-28 VITALS — BP 136/88 | Ht 67.0 in | Wt 140.0 lb

## 2022-07-28 DIAGNOSIS — E039 Hypothyroidism, unspecified: Secondary | ICD-10-CM | POA: Diagnosis not present

## 2022-07-28 DIAGNOSIS — I739 Peripheral vascular disease, unspecified: Secondary | ICD-10-CM

## 2022-07-28 DIAGNOSIS — M25552 Pain in left hip: Secondary | ICD-10-CM

## 2022-07-28 DIAGNOSIS — E232 Diabetes insipidus: Secondary | ICD-10-CM | POA: Diagnosis not present

## 2022-07-28 DIAGNOSIS — J449 Chronic obstructive pulmonary disease, unspecified: Secondary | ICD-10-CM | POA: Diagnosis not present

## 2022-07-28 DIAGNOSIS — D692 Other nonthrombocytopenic purpura: Secondary | ICD-10-CM

## 2022-07-28 DIAGNOSIS — E871 Hypo-osmolality and hyponatremia: Secondary | ICD-10-CM

## 2022-07-28 DIAGNOSIS — K5904 Chronic idiopathic constipation: Secondary | ICD-10-CM

## 2022-07-28 DIAGNOSIS — R6889 Other general symptoms and signs: Secondary | ICD-10-CM | POA: Diagnosis not present

## 2022-07-28 NOTE — Progress Notes (Signed)
   Subjective:    Patient ID: [name], male    DOB: 21-May-1940, 82 y.o.   MRN: [id number]  Hypertension This is a chronic problem. The current episode started more than 1 year ago. Risk factors for coronary artery disease include male gender and dyslipidemia. Treatments tried: imdur, lopressor.    Patient having pain in leg and groin- having to use cane Patient with pain in the groin region it has been going on over the past few weeks certain movements cause severe pain hurts to go up and down steps.  Denies any falls or injuries.  But does relate his legs seem to be getting weaker than what they used to be but does not do a lot of activity  Also cold all the time and would like sone lab work at times he feels feeling like he is getting cold.  Other times he relates lack of appetite.  His sodium has been low.  He is followed by endocrinology for thyroid as well.  But he is requesting lab work.  Review of Systems     Objective:   Physical Exam  General-in no acute distress Eyes-no discharge Lungs-respiratory rate normal, CTA CV-no murmurs,RRR Extremities skin warm dry no edema Neuro grossly normal Behavior normal, alert Senile purpura noted on the arms      Assessment & Plan:  1. Cold intolerance Check thyroid function await lab work. - TSH - Basic metabolic panel  2. Chronic idiopathic constipation MiraLAX daily.  If he starts seeing any blood in stool or has ongoing issues with this will need further workup  3. Hyponatremia Has had low sodium in the past it has been very low he tries to minimize fluid intake.  Check lab work await results  4. Hypothyroidism, unspecified type He is followed by his endocrinologist on a regular basis await thyroid results he is concerned that thyroid could be causing him to have cold intolerance  5. Left hip pain X-ray of the left hip.  Await results of this.  May have osteoarthritis issues may need orthopedics - DG Hip Unilat W OR  W/O Pelvis 2-3 Views Left  6. COPD mixed type ([place]) This was seen on pre to be a scan currently his breathing seems to be doing okay he does not smoke  7. Peripheral arterial disease ([place]) History referral artery disease currently doing okay no claudication issues going on  8. Diabetes insipidus ([place]) History of diabetes insipidus related to a pituitary issue.  Followed by endocrinology check sodium await results  9. Senile purpura (HCC) Senile purpura stable

## 2022-07-29 DIAGNOSIS — Z961 Presence of intraocular lens: Secondary | ICD-10-CM | POA: Diagnosis not present

## 2022-08-02 ENCOUNTER — Ambulatory Visit (HOSPITAL_COMMUNITY)
Admission: RE | Admit: 2022-08-02 | Discharge: 2022-08-02 | Disposition: A | Discharge status: Home / Self Care | Payer: Medicare Other | Source: Ambulatory Visit | Attending: Family Medicine | Admitting: Family Medicine

## 2022-08-02 DIAGNOSIS — R6889 Other general symptoms and signs: Secondary | ICD-10-CM | POA: Diagnosis not present

## 2022-08-02 DIAGNOSIS — M25552 Pain in left hip: Secondary | ICD-10-CM | POA: Insufficient documentation

## 2022-08-03 ENCOUNTER — Encounter: Payer: Self-pay | Admitting: Family Medicine

## 2022-08-03 LAB — BASIC METABOLIC PANEL
BUN/Creatinine Ratio: 12 (ref 10–24)
BUN: 13 mg/dL (ref 8–27)
CO2: 22 mmol/L (ref 20–29)
Calcium: 9.7 mg/dL (ref 8.6–10.2)
Chloride: 99 mmol/L (ref 96–106)
Creatinine, Ser: 1.09 mg/dL (ref 0.76–1.27)
Glucose: 110 mg/dL — ABNORMAL HIGH (ref 70–99)
Potassium: 4.9 mmol/L (ref 3.5–5.2)
Sodium: 137 mmol/L (ref 134–144)
eGFR: 69 mL/min/{1.73_m2} (ref >59)

## 2022-08-03 LAB — TSH: TSH: 2.04 u[IU]/mL (ref 0.450–4.500)

## 2022-08-03 NOTE — Progress Notes (Signed)
Please mail to patient does not use MyChart

## 2022-11-25 ENCOUNTER — Telehealth: Payer: Self-pay

## 2022-11-25 DIAGNOSIS — R1310 Dysphagia, unspecified: Secondary | ICD-10-CM

## 2022-11-25 NOTE — Telephone Encounter (Signed)
Pt has seen a throat in [place] they did the lower part of the throat told patient that he needed to see ENT for the upper part of the throat . Pt is getting to where he has trouble swallowing   Edwyna Perfect [phone]

## 2022-11-26 ENCOUNTER — Other Ambulatory Visit: Payer: Self-pay | Admitting: Internal Medicine

## 2022-11-26 DIAGNOSIS — D352 Benign neoplasm of pituitary gland: Secondary | ICD-10-CM

## 2022-11-26 NOTE — Telephone Encounter (Signed)
May have referral to ENT due to swallowing difficulty

## 2022-11-29 NOTE — Telephone Encounter (Signed)
Referral ordered in EPIC. Patient notified. 

## 2023-01-13 ENCOUNTER — Ambulatory Visit (INDEPENDENT_AMBULATORY_CARE_PROVIDER_SITE_OTHER): Payer: Medicare Other | Admitting: Otolaryngology

## 2023-01-13 ENCOUNTER — Encounter (INDEPENDENT_AMBULATORY_CARE_PROVIDER_SITE_OTHER): Payer: Self-pay | Admitting: Otolaryngology

## 2023-01-13 VITALS — BP 148/68 | HR 66

## 2023-01-13 DIAGNOSIS — R432 Parageusia: Secondary | ICD-10-CM

## 2023-01-13 DIAGNOSIS — R634 Abnormal weight loss: Secondary | ICD-10-CM | POA: Diagnosis not present

## 2023-01-13 DIAGNOSIS — K219 Gastro-esophageal reflux disease without esophagitis: Secondary | ICD-10-CM | POA: Diagnosis not present

## 2023-01-13 DIAGNOSIS — J383 Other diseases of vocal cords: Secondary | ICD-10-CM | POA: Diagnosis not present

## 2023-01-13 DIAGNOSIS — R0981 Nasal congestion: Secondary | ICD-10-CM | POA: Diagnosis not present

## 2023-01-13 DIAGNOSIS — R1310 Dysphagia, unspecified: Secondary | ICD-10-CM | POA: Diagnosis not present

## 2023-01-13 NOTE — Progress Notes (Signed)
ENT CONSULT:  Reason for Consult: dysphagia    HPI: [name] is an 82 y.o. male with history of prostate cancer, history of pituitary adenoma,  longstanding history of trouble swallowing, here for initial evaluation with me.  Previously seen by GI had upper endoscopy 2021, which showed tight CP bar per report.  He was referred to be seen at a tertiary care center and was seen at [facility] by another GI physician who recommended ENT consultation due to prominent CP bar.  He also had an esophagram several years ago (2021), and it showed prominent CP bar and " tiny left posterolateral diverticulum inferior to CP muscle"  He coughs with solids and liquids all the time, and it is hard for him to swallow pills.  He reports the symptoms for over 4 years. Even his own secretions can cause choking and coughing because it is hard for him to clear the secretions.  No strokes in the past, he does have history of CAD, but no MI, no cardiac stents.  He had antibiotics given for suspected PNA at the [place] 1 yr ago, no fevers at home, and he is not sure if the pneumonia he had 1 year ago was aspiration pneumonia. He reports he is losing weight and used to be 155 lb now he is down to 131 lb. This is due to poor sense of taste and lack of appetite. Had seen a nutritionist at the [place], and drinks Ensure to supplement calories. He does not drink Ensure every day. He eats cheerios mostly, and soft foods. Hx of pituitary adenoma surgery 2009 - no issues - having MRI brain tomorrow.   Records Reviewed:  EGD report from [facility] - tight CP bar during EGD 2021 Sent to see GI at [facility] and told to see ENT for CP bar     Past Medical History:  Diagnosis Date   Allergy    Arthritis    CAD (coronary artery disease)    Nonobstructive CAD with the exception of a nondominant RCA that was managed medically in April 2020.   Colon polyps    Adenomatous   Constipation    COPD (chronic obstructive pulmonary disease) (HCC)     Diverticulosis    Essential hypertension    GERD (gastroesophageal reflux disease)    History of anxiety    History of depression    Hypercholesterolemia    Hypothyroidism    Pituitary microadenoma (HCC)    Status post resection 2009   Prostate cancer (HCC)    Senile purpura (HCC) 10/23/2018    Past Surgical History:  Procedure Laterality Date   BACK SURGERY  1991   CHOLECYSTECTOMY  08/2007   COLONOSCOPY     LEFT HEART CATH AND CORONARY ANGIOGRAPHY N/A 09/01/2018   Procedure: LEFT HEART CATH AND CORONARY ANGIOGRAPHY;  Surgeon: Lennette Bihari, MD;  Location: [facility] INVASIVE CV LAB;  Service: Cardiovascular;  Laterality: N/A;   POLYPECTOMY     PROSTATE SURGERY     Removal of pituitary tumor  9/09   UPPER GASTROINTESTINAL ENDOSCOPY      Family History  Problem Relation Age of Onset   Stomach cancer Mother    Clotting disorder Father    Heart disease Father    Heart attack Father    Colon cancer Neg Hx    Diabetes Neg Hx    Colon polyps Neg Hx    Rectal cancer Neg Hx     Social History:  reports that he has  quit smoking. His smoking use included cigarettes. He has never used smokeless tobacco. He reports that he does not currently use alcohol. He reports that he does not currently use drugs.  Allergies:  Allergies  Allergen Reactions   Codeine Nausea Only   Losartan     Patient had elevated potassium and also slight elevation of creatinine-October 2017   Morphine     Chest pain    Mirtazapine     Severe drowsiness   Other Other (See Comments)    Body aches intolerant to statins   Statins Other (See Comments)    Body aches intolerant to statins   Tramadol     Sweat, light-headed    Medications: I have reviewed the patient's current medications.  The PMH, PSH, Medications, Allergies, and SH were reviewed and updated.  ROS: Constitutional: Negative for fever, weight loss and weight gain. Cardiovascular: Negative for chest pain and dyspnea on exertion. Respiratory:  Is not experiencing shortness of breath at rest. Gastrointestinal: Negative for nausea and vomiting. Neurological: Negative for headaches. Psychiatric: The patient is not nervous/anxious  Blood pressure (!) 148/68, pulse 66, SpO2 97%.  PHYSICAL EXAM:  Exam: General: Well-developed, well-nourished Communication and Voice: Raspy with weak projection Respiratory Respiratory effort: Equal inspiration and expiration without stridor Cardiovascular Peripheral Vascular: Warm extremities with equal color/perfusion Eyes: No nystagmus with equal extraocular motion bilaterally Neuro/Psych/Balance: Patient oriented to person, place, and time; Appropriate mood and affect; Gait is intact with no imbalance; Cranial nerves I-XII are intact Head and Face Inspection: Normocephalic and atraumatic without mass or lesion Palpation: Facial skeleton intact without bony stepoffs Salivary Glands: No mass or tenderness Facial Strength: Facial motility symmetric and full bilaterally ENT Pinna: External ear intact and fully developed External canal: Canal is patent with intact skin Tympanic Membrane: Clear and mobile External Nose: No scar or anatomic deformity Internal Nose: Septum is relatively straight. No polyp, or purulence. Mucosal edema and erythema present.  Bilateral inferior turbinate hypertrophy.  Lips, Teeth, and gums: Mucosa and teeth intact and viable TMJ: No pain to palpation with full mobility Oral cavity/oropharynx: No erythema or exudate, no lesions present Nasopharynx: No mass or lesion with intact mucosa Hypopharynx: Intact mucosa without pooling of secretions Larynx Glottic: Full true vocal cord mobility without lesion or mass with VF atrophy  Supraglottic: Normal appearing epiglottis and AE folds Interarytenoid Space: No or minimal pachydermia or edema Subglottic Space: Patent without lesion or edema Neck Neck and Trachea: Midline trachea without mass or lesion Thyroid: No mass or  nodularity Lymphatics: No lymphadenopathy  Procedure: Preoperative diagnosis: dysphagia   Postoperative diagnosis:   Same + GERD/LPR  Procedure: Flexible fiberoptic laryngoscopy  Surgeon: Ashok Croon, MD  Anesthesia: Topical lidocaine and Afrin Complications: None Condition is stable throughout exam  Indications and consent:  The patient presents to the clinic with Indirect laryngoscopy view was incomplete. Thus it was recommended that they undergo a flexible fiberoptic laryngoscopy. All of the risks, benefits, and potential complications were reviewed with the patient preoperatively and verbal informed consent was obtained.  Procedure: The patient was seated upright in the clinic. Topical lidocaine and Afrin were applied to the nasal cavity. After adequate anesthesia had occurred, I then proceeded to pass the flexible telescope into the nasal cavity. The nasal cavity was patent without rhinorrhea or polyp. The nasopharynx was also patent without mass or lesion. The base of tongue was visualized and was normal. There were no signs of pooling of secretions in the piriform sinuses. The true  vocal folds were mobile bilaterally. There were no signs of glottic or supraglottic mucosal lesion or mass. There was moderate interarytenoid pachydermia and post cricoid edema. The telescope was then slowly withdrawn and the patient tolerated the procedure throughout.  Studies Reviewed: 03/03/20 esophagram CLINICAL DATA:  Cervical dysphagia question cricopharyngeal bar, difficulty swallowing pills, feels like liquids are coming back up, worsening symptoms for a month, on medication for GERD, history prostate cancer EXAM: ESOPHOGRAM / BARIUM SWALLOW / BARIUM TABLET STUDY   TECHNIQUE: Combined double contrast and single contrast examination performed using effervescent crystals, thick barium liquid, and thin barium liquid. The patient was observed with fluoroscopy swallowing a 13 mm barium  sulphate tablet.   FLUOROSCOPY TIME:  Fluoroscopy Time:  2 minutes 0 seconds   Radiation Exposure Index (if provided by the fluoroscopic device): 37.3 mGy   Number of Acquired Spot Images: multiple fluoroscopic screen captures   COMPARISON:  None   FINDINGS: Esophageal distention: Normal distention without mass or stricture   Filling defects:  None   12.5 mm barium tablet: Easily passed from oral cavity to stomach without obstruction   Motility:  Moderate diffuse age-related esophageal dysmotility   Mucosa:  Smooth without irregularity or ulceration   Hypopharynx/cervical esophagus: Prominent cricopharyngeus muscle, nonobstructing. Tiny LEFT posterolateral diverticulum is identified inferior to the cricopharyngeus. No laryngeal penetration or aspiration. Mild residuals in vallecula and piriform sinuses, cleared by a voluntary second swallow.   Hiatal hernia:  Absent   GE reflux:  Not witnessed during exam   Other:  N/A   IMPRESSION: Moderate esophageal dysmotility.   Prominent cricopharyngeus muscle with a tiny LEFT posterolateral diverticulum inferior to the cricopharyngeus muscle.   Mild vallecular and piriform sinus residuals without laryngeal penetration or aspiration.   No esophageal mass or stricture.  Assessment/Plan: Encounter Diagnoses  Name Primary?   Dysphagia, unspecified type Yes   Gastroesophageal reflux disease without esophagitis    Vocal fold atrophy    Nasal congestion    Dysgeusia    Loss of weight     82 year old male with history of pituitary adenoma status post endoscopic resection 2009, history of COPD, hx of DI related to pituitary adenoma surgery, f/b Endocrinology, and low sodium levels in the past, who is here for evaluation of chronic long-standing dysphagia to solids and liquids.  Reports choking on both and also his own secretions.  No history of stroke.  Had esophagram done in 2021 which revealed prominent CP bar and tiny left  posterolateral diverticulum below CP bar.  I reviewed esophagram imaging and there appears to be CP hypertrophy.  My exam today did not reveal any focal neurologic deficits cranial nerves II through XII were intact, tongue and palate movement were intact and symmetric.  There was evidence of bilateral vocal fold movement, but he did have bilateral vocal fold atrophy and I suspect some degree of glottic insufficiency.  There was moderate postcricoid edema and pachydermia.  There was no pooling of secretions in hypopharynx or vallecula.  I did pass the scope past CP bar while he was swallowing water and UES appeared to open without issues and I did not visualize any strictures or diverticula.  Due to the fact that swallow studies are relatively outdated, and he never had modified barium swallow.  Will order repeat swallow evaluation.  He will return after testing to discuss results and consider any future interventions for his symptoms.  DDx includes oropharyngeal dysphagia, although his past esophagram also demonstrated moderate esophageal dysmotility which  suggests element of esophageal dysphagia.  Will consider management of CP hypertrophy once swallow test results are available for review.   - MBS/esophagram - RTC after swallow study   Thank you for allowing me to participate in the care of this patient. Please do not hesitate to contact me with any questions or concerns.   Ashok Croon, MD Otolaryngology [facility] Phone: [phone] Fax: [phone]    01/13/2023, 8:40 PM

## 2023-01-13 NOTE — Patient Instructions (Signed)
-   schedule swallow study

## 2023-01-14 ENCOUNTER — Ambulatory Visit
Admission: RE | Admit: 2023-01-14 | Discharge: 2023-01-14 | Disposition: A | Discharge status: Home / Self Care | Payer: Medicare Other | Source: Ambulatory Visit | Attending: Internal Medicine | Admitting: Internal Medicine

## 2023-01-14 DIAGNOSIS — H5711 Ocular pain, right eye: Secondary | ICD-10-CM | POA: Diagnosis not present

## 2023-01-14 DIAGNOSIS — D352 Benign neoplasm of pituitary gland: Secondary | ICD-10-CM

## 2023-01-14 MED ORDER — GADOPICLENOL 0.5 MMOL/ML IV SOLN
6.0000 mL | Freq: Once | INTRAVENOUS | Status: AC | PRN
  Administered 2023-01-14: 6 mL via INTRAVENOUS

## 2023-01-17 ENCOUNTER — Telehealth: Payer: Self-pay | Admitting: Family Medicine

## 2023-01-17 NOTE — Telephone Encounter (Signed)
Patient notified and verbalized understanding. 

## 2023-01-17 NOTE — Telephone Encounter (Signed)
Feel free to share that with pt- he can call ENT

## 2023-01-17 NOTE — Telephone Encounter (Signed)
Patient seen ENT specialist and they want him to have a swallow test done but he wants it at [facility] instead of [facility]. He is wanting you to put order in so he can have it done at [facility].He states two parts to this test  and his notes from ENT doctor are in my chart.

## 2023-01-20 ENCOUNTER — Other Ambulatory Visit (HOSPITAL_COMMUNITY): Payer: Self-pay | Admitting: *Deleted

## 2023-01-20 DIAGNOSIS — R1310 Dysphagia, unspecified: Secondary | ICD-10-CM

## 2023-01-20 DIAGNOSIS — R059 Cough, unspecified: Secondary | ICD-10-CM

## 2023-01-21 ENCOUNTER — Observation Stay (HOSPITAL_COMMUNITY)
Admission: EM | Admit: 2023-01-21 | Discharge: 2023-01-22 | Disposition: A | Discharge status: Home / Self Care | Payer: Medicare Other | Attending: Family Medicine | Admitting: Family Medicine

## 2023-01-21 ENCOUNTER — Other Ambulatory Visit: Payer: Self-pay

## 2023-01-21 ENCOUNTER — Ambulatory Visit (INDEPENDENT_AMBULATORY_CARE_PROVIDER_SITE_OTHER): Payer: Medicare Other

## 2023-01-21 ENCOUNTER — Encounter (HOSPITAL_COMMUNITY): Payer: Self-pay

## 2023-01-21 ENCOUNTER — Telehealth: Payer: Self-pay

## 2023-01-21 VITALS — Ht 67.0 in | Wt 143.0 lb

## 2023-01-21 DIAGNOSIS — Z8546 Personal history of malignant neoplasm of prostate: Secondary | ICD-10-CM | POA: Diagnosis not present

## 2023-01-21 DIAGNOSIS — Z0000 Encounter for general adult medical examination without abnormal findings: Secondary | ICD-10-CM | POA: Diagnosis not present

## 2023-01-21 DIAGNOSIS — I25118 Atherosclerotic heart disease of native coronary artery with other forms of angina pectoris: Secondary | ICD-10-CM | POA: Diagnosis not present

## 2023-01-21 DIAGNOSIS — Z7982 Long term (current) use of aspirin: Secondary | ICD-10-CM | POA: Diagnosis not present

## 2023-01-21 DIAGNOSIS — F419 Anxiety disorder, unspecified: Secondary | ICD-10-CM | POA: Insufficient documentation

## 2023-01-21 DIAGNOSIS — J449 Chronic obstructive pulmonary disease, unspecified: Secondary | ICD-10-CM | POA: Insufficient documentation

## 2023-01-21 DIAGNOSIS — E7800 Pure hypercholesterolemia, unspecified: Secondary | ICD-10-CM | POA: Diagnosis present

## 2023-01-21 DIAGNOSIS — R531 Weakness: Secondary | ICD-10-CM | POA: Diagnosis present

## 2023-01-21 DIAGNOSIS — I25119 Atherosclerotic heart disease of native coronary artery with unspecified angina pectoris: Secondary | ICD-10-CM | POA: Diagnosis present

## 2023-01-21 DIAGNOSIS — E039 Hypothyroidism, unspecified: Secondary | ICD-10-CM | POA: Diagnosis not present

## 2023-01-21 DIAGNOSIS — E871 Hypo-osmolality and hyponatremia: Secondary | ICD-10-CM | POA: Diagnosis not present

## 2023-01-21 DIAGNOSIS — R1310 Dysphagia, unspecified: Secondary | ICD-10-CM

## 2023-01-21 DIAGNOSIS — Z87891 Personal history of nicotine dependence: Secondary | ICD-10-CM | POA: Insufficient documentation

## 2023-01-21 LAB — URINALYSIS, ROUTINE W REFLEX MICROSCOPIC
Bacteria, UA: NONE SEEN
Bilirubin Urine: NEGATIVE
Glucose, UA: NEGATIVE mg/dL
Ketones, ur: NEGATIVE mg/dL
Leukocytes,Ua: NEGATIVE
Nitrite: NEGATIVE
Protein, ur: 30 mg/dL — AB
Specific Gravity, Urine: 1.002 — ABNORMAL LOW (ref 1.005–1.030)
pH: 6 (ref 5.0–8.0)

## 2023-01-21 LAB — GLUCOSE, CAPILLARY
Glucose-Capillary: 67 mg/dL — ABNORMAL LOW (ref 70–99)
Glucose-Capillary: 83 mg/dL (ref 70–99)

## 2023-01-21 LAB — BASIC METABOLIC PANEL
Anion gap: 6 (ref 5–15)
Anion gap: 7 (ref 5–15)
BUN: 11 mg/dL (ref 8–23)
BUN: 11 mg/dL (ref 8–23)
CO2: 25 mmol/L (ref 22–32)
CO2: 26 mmol/L (ref 22–32)
Calcium: 8.2 mg/dL — ABNORMAL LOW (ref 8.9–10.3)
Calcium: 8.8 mg/dL — ABNORMAL LOW (ref 8.9–10.3)
Chloride: 93 mmol/L — ABNORMAL LOW (ref 98–111)
Chloride: 97 mmol/L — ABNORMAL LOW (ref 98–111)
Creatinine, Ser: 0.9 mg/dL (ref 0.61–1.24)
Creatinine, Ser: 0.87 mg/dL (ref 0.61–1.24)
GFR, Estimated: >60 mL/min (ref >60)
GFR, Estimated: >60 mL/min (ref >60)
Glucose, Bld: 111 mg/dL — ABNORMAL HIGH (ref 70–99)
Glucose, Bld: 116 mg/dL — ABNORMAL HIGH (ref 70–99)
Potassium: 4 mmol/L (ref 3.5–5.1)
Potassium: 4.6 mmol/L (ref 3.5–5.1)
Sodium: 124 mmol/L — ABNORMAL LOW (ref 135–145)
Sodium: 130 mmol/L — ABNORMAL LOW (ref 135–145)

## 2023-01-21 LAB — CREATININE, URINE, RANDOM: Creatinine, Urine: 18 mg/dL

## 2023-01-21 LAB — CBC
HCT: 44.6 % (ref 39.0–52.0)
Hemoglobin: 15 g/dL (ref 13.0–17.0)
MCH: 29.8 pg (ref 26.0–34.0)
MCHC: 33.6 g/dL (ref 30.0–36.0)
MCV: 88.7 fL (ref 80.0–100.0)
Platelets: 152 10*3/uL (ref 150–400)
RBC: 5.03 MIL/uL (ref 4.22–5.81)
RDW: 12.4 % (ref 11.5–15.5)
WBC: 7.3 10*3/uL (ref 4.0–10.5)
nRBC: 0 % (ref 0.0–0.2)

## 2023-01-21 LAB — SODIUM, URINE, RANDOM: Sodium, Ur: 27 mmol/L

## 2023-01-21 MED ORDER — ISOSORBIDE MONONITRATE ER 60 MG PO TB24
60.0000 mg | ORAL_TABLET | Freq: Two times a day (BID) | ORAL | Status: DC
  Administered 2023-01-21 – 2023-01-22 (×2): 60 mg via ORAL
  Filled 2023-01-21 (×2): qty 1

## 2023-01-21 MED ORDER — PANTOPRAZOLE SODIUM 40 MG PO TBEC
40.0000 mg | DELAYED_RELEASE_TABLET | Freq: Every day | ORAL | Status: DC
  Administered 2023-01-22: 40 mg via ORAL
  Filled 2023-01-21: qty 1

## 2023-01-21 MED ORDER — SODIUM CHLORIDE 1 G PO TABS
1.0000 g | ORAL_TABLET | Freq: Three times a day (TID) | ORAL | Status: DC
  Administered 2023-01-22 (×2): 1 g via ORAL
  Filled 2023-01-21 (×2): qty 1

## 2023-01-21 MED ORDER — SODIUM CHLORIDE 0.9 % IV SOLN: INTRAVENOUS | Status: DC

## 2023-01-21 MED ORDER — METOPROLOL TARTRATE 25 MG PO TABS
12.5000 mg | ORAL_TABLET | Freq: Two times a day (BID) | ORAL | Status: DC
  Administered 2023-01-21 – 2023-01-22 (×2): 12.5 mg via ORAL
  Filled 2023-01-21 (×2): qty 1

## 2023-01-21 MED ORDER — ACETAMINOPHEN 325 MG PO TABS: 650.0000 mg | ORAL_TABLET | Freq: Four times a day (QID) | ORAL | Status: DC | PRN

## 2023-01-21 MED ORDER — UMECLIDINIUM BROMIDE 62.5 MCG/ACT IN AEPB
1.0000 | INHALATION_SPRAY | Freq: Every day | RESPIRATORY_TRACT | Status: DC
  Administered 2023-01-22: 1 via RESPIRATORY_TRACT
  Filled 2023-01-21: qty 7

## 2023-01-21 MED ORDER — ONDANSETRON HCL 4 MG PO TABS: 4.0000 mg | ORAL_TABLET | Freq: Four times a day (QID) | ORAL | Status: DC | PRN

## 2023-01-21 MED ORDER — ACETAMINOPHEN 650 MG RE SUPP: 650.0000 mg | Freq: Four times a day (QID) | RECTAL | Status: DC | PRN

## 2023-01-21 MED ORDER — EZETIMIBE 10 MG PO TABS
10.0000 mg | ORAL_TABLET | Freq: Every day | ORAL | Status: DC
  Administered 2023-01-22: 10 mg via ORAL
  Filled 2023-01-21: qty 1

## 2023-01-21 MED ORDER — ONDANSETRON HCL 4 MG/2ML IJ SOLN: 4.0000 mg | Freq: Four times a day (QID) | INTRAMUSCULAR | Status: DC | PRN

## 2023-01-21 MED ORDER — ASPIRIN 81 MG PO TBEC
81.0000 mg | DELAYED_RELEASE_TABLET | Freq: Every day | ORAL | Status: DC
  Administered 2023-01-22: 81 mg via ORAL
  Filled 2023-01-21: qty 1

## 2023-01-21 MED ORDER — HYDROXYZINE HCL 10 MG PO TABS: 5.0000 mg | ORAL_TABLET | Freq: Three times a day (TID) | ORAL | Status: DC | PRN

## 2023-01-21 MED ORDER — HEPARIN SODIUM (PORCINE) 5000 UNIT/ML IJ SOLN
5000.0000 [IU] | Freq: Three times a day (TID) | INTRAMUSCULAR | Status: DC
  Administered 2023-01-21 – 2023-01-22 (×2): 5000 [IU] via SUBCUTANEOUS
  Filled 2023-01-21 (×2): qty 1

## 2023-01-21 MED ORDER — ARFORMOTEROL TARTRATE 15 MCG/2ML IN NEBU
15.0000 ug | INHALATION_SOLUTION | Freq: Two times a day (BID) | RESPIRATORY_TRACT | Status: DC
  Administered 2023-01-21 – 2023-01-22 (×2): 15 ug via RESPIRATORY_TRACT
  Filled 2023-01-21 (×2): qty 2

## 2023-01-21 MED ORDER — OXYCODONE HCL 5 MG PO TABS: 5.0000 mg | ORAL_TABLET | ORAL | Status: DC | PRN

## 2023-01-21 MED ORDER — PRAVASTATIN SODIUM 10 MG PO TABS
20.0000 mg | ORAL_TABLET | Freq: Every evening | ORAL | Status: DC
  Administered 2023-01-21: 20 mg via ORAL
  Filled 2023-01-21: qty 2

## 2023-01-21 MED ORDER — LEVOTHYROXINE SODIUM 50 MCG PO TABS
50.0000 ug | ORAL_TABLET | Freq: Every day | ORAL | Status: DC
  Administered 2023-01-22: 50 ug via ORAL
  Filled 2023-01-21: qty 1

## 2023-01-21 MED ORDER — SODIUM CHLORIDE 0.9 % IV BOLUS
1000.0000 mL | Freq: Once | INTRAVENOUS | Status: AC
  Administered 2023-01-21: 1000 mL via INTRAVENOUS

## 2023-01-21 NOTE — Telephone Encounter (Signed)
Patient called and stated the [facility] drew [facility] on him today and called him with results and advised he go to the hospital for low sodium level , he calls here for advise per drs recommendations he was advised to proceed to the ER for further evaluation due to pt stated he is having weakness.

## 2023-01-21 NOTE — ED Notes (Signed)
ED TO INPATIENT HANDOFF REPORT  ED Nurse Name and Phone #: [phone]  S Name/Age/Gender [name] 82 y.o. male Room/Bed: APA11/APA11  Code Status   Code Status: Prior  Home/SNF/Other Home Patient oriented to: self, place, time, and situation Is this baseline? Yes   Triage Complete: Triage complete  Chief Complaint Acute hyponatremia [E87.1]  Triage Note Pt reports went to [facility] for [facility]mology exam and was going be started on new med and needed lab draw prior to being started.  Pt sent due to sodium level of 127.  C/o weakness   Allergies Allergies  Allergen Reactions   Codeine Nausea Only and Nausea And Vomiting   Losartan     Patient had elevated potassium and also slight elevation of creatinine-October 2017   Amlodipine Nausea And Vomiting    Complained of neck pain with medication-November 2017 not allergic  Complained of neck pain with medication-November 2017   Mirtazapine     Severe drowsiness   Morphine Swelling    Chest pain   Other Other (See Comments)    Body aches intolerant to statins   Statins Other (See Comments)    Body aches intolerant to statins   Tramadol Nausea And Vomiting    Sweat, light-headed    Level of Care/Admitting Diagnosis ED Disposition     ED Disposition  Admit   Condition  --   Comment  Hospital Area: [facility] [[id number]]  Level of Care: Telemetry [5]  Covid Evaluation: Asymptomatic - no recent exposure (last 10 days) testing not required  Diagnosis: Acute hyponatremia [403474]  Admitting Physician: Lilyan Gilford [[id number]]  Attending Physician: Lilyan Gilford [[id number]]          B Medical/Surgery History Past Medical History:  Diagnosis Date   Allergy    Arthritis    CAD (coronary artery disease)    Nonobstructive CAD with the exception of a nondominant RCA that was managed medically in April 2020.   Colon polyps    Adenomatous   Constipation    COPD (chronic obstructive pulmonary  disease) (HCC)    Diverticulosis    Essential hypertension    GERD (gastroesophageal reflux disease)    History of anxiety    History of depression    Hypercholesterolemia    Hypothyroidism    Pituitary microadenoma (HCC)    Status post resection 2009   Prostate cancer (HCC)    Senile purpura (HCC) 10/23/2018   Past Surgical History:  Procedure Laterality Date   BACK SURGERY  1991   CHOLECYSTECTOMY  08/2007   COLONOSCOPY     LEFT HEART CATH AND CORONARY ANGIOGRAPHY N/A 09/01/2018   Procedure: LEFT HEART CATH AND CORONARY ANGIOGRAPHY;  Surgeon: Lennette Bihari, MD;  Location: [facility] INVASIVE CV LAB;  Service: Cardiovascular;  Laterality: N/A;   POLYPECTOMY     PROSTATE SURGERY     Removal of pituitary tumor  9/09   UPPER GASTROINTESTINAL ENDOSCOPY       A IV Location/Drains/Wounds Patient Lines/Drains/Airways Status     Active Line/Drains/Airways     Name Placement date Placement time Site Days   Peripheral IV 01/21/23 20 G 1" Anterior;Right Forearm 01/21/23  1854  Forearm  less than 1            Intake/Output Last 24 hours No intake or output data in the 24 hours ending 01/21/23 1952  Labs/Imaging Results for orders placed or performed during the hospital encounter of 01/21/23 (from the past 48 hour(s))  Basic metabolic panel     Status: Abnormal   Collection Time: 01/21/23  5:23 PM  Result Value Ref Range   Sodium 124 (L) 135 - 145 mmol/L   Potassium 4.0 3.5 - 5.1 mmol/L   Chloride 93 (L) 98 - 111 mmol/L   CO2 25 22 - 32 mmol/L   Glucose, Bld 111 (H) 70 - 99 mg/dL    Comment: Glucose reference range applies only to samples taken after fasting for at least 8 hours.   BUN 11 8 - 23 mg/dL   Creatinine, Ser 1.61 0.61 - 1.24 mg/dL   Calcium 8.2 (L) 8.9 - 10.3 mg/dL   GFR, Estimated >09 >60 mL/min    Comment: (NOTE) Calculated using the CKD-EPI Creatinine Equation (2021)    Anion gap 6 5 - 15    Comment: Performed at [facility], [address] [place],  [place] [address]  CBC     Status: None   Collection Time: 01/21/23  5:23 PM  Result Value Ref Range   WBC 7.3 4.0 - 10.5 K/uL   RBC 5.03 4.22 - 5.81 MIL/uL   Hemoglobin 15.0 13.0 - 17.0 g/dL   HCT 81.1 91.4 - 78.2 %   MCV 88.7 80.0 - 100.0 fL   MCH 29.8 26.0 - 34.0 pg   MCHC 33.6 30.0 - 36.0 g/dL   RDW 95.6 21.3 - 08.6 %   Platelets 152 150 - 400 K/uL   nRBC 0.0 0.0 - 0.2 %    Comment: Performed at [facility], [address] [place], [place] [address]  Urinalysis, Routine w reflex microscopic -Urine, Clean Catch     Status: Abnormal   Collection Time: 01/21/23  7:20 PM  Result Value Ref Range   Color, Urine STRAW (A) YELLOW   APPearance CLEAR CLEAR   Specific Gravity, Urine 1.002 (L) 1.005 - 1.030   pH 6.0 5.0 - 8.0   Glucose, UA NEGATIVE NEGATIVE mg/dL   Hgb urine dipstick SMALL (A) NEGATIVE   Bilirubin Urine NEGATIVE NEGATIVE   Ketones, ur NEGATIVE NEGATIVE mg/dL   Protein, ur 30 (A) NEGATIVE mg/dL   Nitrite NEGATIVE NEGATIVE   Leukocytes,Ua NEGATIVE NEGATIVE   RBC / HPF 0-5 0 - 5 RBC/hpf   WBC, UA 0-5 0 - 5 WBC/hpf   Bacteria, UA NONE SEEN NONE SEEN   Squamous Epithelial / HPF 0-5 0 - 5 /HPF    Comment: Performed at [facility], [address], [place], [place] [address]   No results found.  Pending Labs Unresulted Labs (From admission, onward)    None       Vitals/Pain Today's Vitals   01/21/23 1701 01/21/23 1701 01/21/23 1854 01/21/23 1924  BP:  (!) 166/59 (!) 161/62 (!) 175/59  Pulse:  64 65 68  Resp:  14 16 17   Temp:  97.6 F (36.4 C)    TempSrc:  Oral    SpO2:  97% 98% 97%  PainSc: 0-No pain   0-No pain    Isolation Precautions No active isolations  Medications Medications  sodium chloride 0.9 % bolus 1,000 mL (1,000 mLs Intravenous New Bag/Given 01/21/23 1922)    Mobility walks     Focused Assessments     R Recommendations: See Admitting Provider Note  Report given to:   Additional Notes:

## 2023-01-21 NOTE — Assessment & Plan Note (Signed)
Continue statin. 

## 2023-01-21 NOTE — ED Triage Notes (Signed)
Pt reports went to [facility] for ophthalmology exam and was going be started on new med and needed lab draw prior to being started.  Pt sent due to sodium level of 127.  C/o weakness

## 2023-01-21 NOTE — Assessment & Plan Note (Signed)
-  Continue synthroid -check TSH given generalized weakness -Continue to monitor

## 2023-01-21 NOTE — Patient Instructions (Addendum)
[name] , Thank you for taking time to come for your Medicare Wellness Visit. I appreciate your ongoing commitment to your health goals. Please review the following plan we discussed and let me know if I can assist you in the future.   Referrals/Orders/Follow-Ups/Clinician Recommendations:   Follow up with Dr. Gerda Diss regarding your sodium level  This is a list of the screening recommended for you and due dates:  Health Maintenance  Topic Date Due   Flu Shot  12/09/2022   COVID-19 Vaccine (5 - 2023-24 season) 01/09/2023   Medicare Annual Wellness Visit  01/21/2024   DTaP/Tdap/Td vaccine (3 - Td or Tdap) 10/22/2028   Pneumonia Vaccine  Completed   Zoster (Shingles) Vaccine  Completed   HPV Vaccine  Aged Out   Colon Cancer Screening  Discontinued    Advanced directives: (ACP Link)Information on Advanced Care Planning can be found at [facility] Advance Health Care Directives Advance Health Care Directives ([email or web address]   Next Medicare Annual Wellness Visit scheduled for next year: Yes  Preventive Care 65 Years and Older, Male Preventive care refers to lifestyle choices and visits with your health care provider that can promote health and wellness. Preventive care visits are also called wellness exams. What can I expect for my preventive care visit? Counseling During your preventive care visit, your health care provider may ask about your: Medical history, including: Past medical problems. Family medical history. History of falls. Current health, including: Emotional well-being. Home life and relationship well-being. Sexual activity. Memory and ability to understand (cognition). Lifestyle, including: Alcohol, nicotine or tobacco, and drug use. Access to firearms. Diet, exercise, and sleep habits. Work and work Astronomer. Sunscreen use. Safety issues such as seatbelt and bike helmet use. Physical exam Your health care provider will check your: Height  and weight. These may be used to calculate your BMI (body mass index). BMI is a measurement that tells if you are at a healthy weight. Waist circumference. This measures the distance around your waistline. This measurement also tells if you are at a healthy weight and may help predict your risk of certain diseases, such as type 2 diabetes and high blood pressure. Heart rate and blood pressure. Body temperature. Skin for abnormal spots. What immunizations do I need?  Vaccines are usually given at various ages, according to a schedule. Your health care provider will recommend vaccines for you based on your age, medical history, and lifestyle or other factors, such as travel or where you work. What tests do I need? Screening Your health care provider may recommend screening tests for certain conditions. This may include: Lipid and cholesterol levels. Diabetes screening. This is done by checking your blood sugar (glucose) after you have not eaten for a while (fasting). Hepatitis C test. Hepatitis B test. HIV (human immunodeficiency virus) test. STI (sexually transmitted infection) testing, if you are at risk. Lung cancer screening. Colorectal cancer screening. Prostate cancer screening. Abdominal aortic aneurysm (AAA) screening. You may need this if you are a current or former smoker. Talk with your health care provider about your test results, treatment options, and if necessary, the need for more tests. Follow these instructions at home: Eating and drinking  Eat a diet that includes fresh fruits and vegetables, whole grains, lean protein, and low-fat dairy products. Limit your intake of foods with high amounts of sugar, saturated fats, and salt. Take vitamin and mineral supplements as recommended by your health care provider. Do not drink alcohol if your health  care provider tells you not to drink. If you drink alcohol: Limit how much you have to 0-2 drinks a day. Know how much alcohol is  in your drink. In the [place] one drink equals one 12 oz bottle of beer (355 mL), one 5 oz glass of wine (148 mL), or one 1 oz glass of hard liquor (44 mL). Lifestyle Brush your teeth every morning and night with fluoride toothpaste. Floss one time each day. Exercise for at least 30 minutes 5 or more days each week. Do not use any products that contain nicotine or tobacco. These products include cigarettes, chewing tobacco, and vaping devices, such as e-cigarettes. If you need help quitting, ask your health care provider. Do not use drugs. If you are sexually active, practice safe sex. Use a condom or other form of protection to prevent STIs. Take aspirin only as told by your health care provider. Make sure that you understand how much to take and what form to take. Work with your health care provider to find out whether it is safe and beneficial for you to take aspirin daily. Ask your health care provider if you need to take a cholesterol-lowering medicine (statin). Find healthy ways to manage stress, such as: Meditation, yoga, or listening to music. Journaling. Talking to a trusted person. Spending time with friends and family. Safety Always wear your seat belt while driving or riding in a vehicle. Do not drive: If you have been drinking alcohol. Do not ride with someone who has been drinking. When you are tired or distracted. While texting. If you have been using any mind-altering substances or drugs. Wear a helmet and other protective equipment during sports activities. If you have firearms in your house, make sure you follow all gun safety procedures. Minimize exposure to UV radiation to reduce your risk of skin cancer. What's next? Visit your health care provider once a year for an annual wellness visit. Ask your health care provider how often you should have your eyes and teeth checked. Stay up to date on all vaccines. This information is not intended to replace advice given to you  by your health care provider. Make sure you discuss any questions you have with your health care provider. Document Revised: 10/22/2020 Document Reviewed: 10/22/2020 Elsevier Patient Education  2024 [employer]. Understanding Your Risk for Falls Millions of people have serious injuries from falls each year. It is important to understand your risk of falling. Talk with your health care provider about your risk and what you can do to lower it. If you do have a serious fall, make sure to tell your provider. Falling once raises your risk of falling again. How can falls affect me? Serious injuries from falls are common. These include: Broken bones, such as hip fractures. Head injuries, such as traumatic brain injuries (TBI) or concussions. A fear of falling can cause you to avoid activities and stay at home. This can make your muscles weaker and raise your risk for a fall. What can increase my risk? There are a number of risk factors that increase your risk for falling. The more risk factors you have, the higher your risk of falling. Serious injuries from a fall happen most often to people who are older than 80 years old. Teenagers and young adults ages 17-29 are also at higher risk. Common risk factors include: Weakness in the lower body. Being generally weak or confused due to long-term (chronic) illness. Dizziness or balance problems. Poor vision. Medicines that cause  dizziness or drowsiness. These may include: Medicines for your blood pressure, heart, anxiety, insomnia, or swelling (edema). Pain medicines. Muscle relaxants. Other risk factors include: Drinking alcohol. Having had a fall in the past. Having foot pain or wearing improper footwear. Working at a dangerous job. Having any of the following in your home: Tripping hazards, such as floor clutter or loose rugs. Poor lighting. Pets. Having dementia or memory loss. What actions can I take to lower my risk of falling?      Physical activity Stay physically fit. Do strength and balance exercises. Consider taking a regular class to build strength and balance. Yoga and tai chi are good options. Vision Have your eyes checked every year and your prescription for glasses or contacts updated as needed. Shoes and walking aids Wear non-skid shoes. Wear shoes that have rubber soles and low heels. Do not wear high heels. Do not walk around the house in socks or slippers. Use a cane or walker as told by your provider. Home safety Attach secure railings on both sides of your stairs. Install grab bars for your bathtub, shower, and toilet. Use a non-skid mat in your bathtub or shower. Attach bath mats securely with double-sided, non-slip rug tape. Use good lighting in all rooms. Keep a flashlight near your bed. Make sure there is a clear path from your bed to the bathroom. Use night-lights. Do not use throw rugs. Make sure all carpeting is taped or tacked down securely. Remove all clutter from walkways and stairways, including extension cords. Repair uneven or broken steps and floors. Avoid walking on icy or slippery surfaces. Walk on the grass instead of on icy or slick sidewalks. Use ice melter to get rid of ice on walkways in the winter. Use a cordless phone. Questions to ask your health care provider Can you help me check my risk for a fall? Do any of my medicines make me more likely to fall? Should I take a vitamin D supplement? What exercises can I do to improve my strength and balance? Should I make an appointment to have my vision checked? Do I need a bone density test to check for weak bones (osteoporosis)? Would it help to use a cane or a walker? Where to find more information Centers for Disease Control and Prevention, STEADI: TonerPromos.no Community-Based Fall Prevention Programs: TonerPromos.no [employer] on Aging: BaseRingTones.pl Contact a health care provider if: You fall at home. You are afraid of falling  at home. You feel weak, drowsy, or dizzy. This information is not intended to replace advice given to you by your health care provider. Make sure you discuss any questions you have with your health care provider. Document Revised: 12/28/2021 Document Reviewed: 12/28/2021 Elsevier Patient Education  2024 [employer].

## 2023-01-21 NOTE — ED Provider Notes (Signed)
[place] [facility] EMERGENCY DEPARTMENT AT [facility] Provider Note   CSN: [id number] Arrival date & time: 01/21/23  1652     History {Add pertinent medical, surgical, social history, OB history to HPI:1} Chief Complaint  Patient presents with   abnormal labs    [name] is a 82 y.o. male.  Patient complains of weakness and dizziness.  He has history of coronary artery disease and COPD and prostate cancer.  He had a sodium checked today and it was low   Weakness      Home Medications Prior to Admission medications   Medication Sig Start Date End Date Taking? Authorizing Provider  aspirin EC 81 MG tablet Take 81 mg by mouth daily.    [provider]  cetirizine (ZYRTEC) 10 MG tablet Take 10 mg by mouth daily.    [provider]  clobetasol cream (TEMOVATE) 0.05 % Apply 1 application topically 2 (two) times daily.    [provider]  desmopressin (DDAVP) 0.1 MG tablet Take 0.1 mg by mouth 4 (four) times daily as needed (Pt takes 3 to 4 times daily).    [provider]  ezetimibe (ZETIA) 10 MG tablet Take 1 tablet by mouth once daily 02/15/19   Jonelle Sidle, MD  hydrOXYzine (ATARAX) 10 MG tablet Take 5 mg by mouth 3 (three) times daily as needed for itching. Patient not taking: Reported on 01/21/2023    [provider]  isosorbide mononitrate (IMDUR) 30 MG 24 hr tablet Take 1 tablet (30 mg total) by mouth 2 (two) times daily. 04/21/20   Jonelle Sidle, MD  levothyroxine (SYNTHROID) 50 MCG tablet Take 50 mcg by mouth daily before breakfast.    [provider]  levothyroxine (SYNTHROID, LEVOTHROID) 88 MCG tablet Take 88 mcg by mouth every other day. Patient not taking: Reported on 01/21/2023    [provider]  melatonin 1 MG TABS tablet Take 1 mg by mouth at bedtime. 1/2 tablet at bedtime Patient not taking: Reported on 01/21/2023    [provider]  metoprolol tartrate (LOPRESSOR) 25 MG tablet  TAKE 1/2 (ONE-HALF) TABLET BY MOUTH TWICE DAILY -- NEEDS APPOINTMENT FOR FURTHER REFILLS 02/15/19   Jonelle Sidle, MD  nitroGLYCERIN (NITROSTAT) 0.4 MG SL tablet Place 1 tablet (0.4 mg total) under the tongue every 5 (five) minutes as needed for chest pain (Call 911 if still having chest pain after taking 3rd tablet). Patient not taking: Reported on 01/13/2023 03/16/22   [employer], Alvino Chapel, FNP  omeprazole (PRILOSEC) 20 MG capsule TAKE 1 CAPSULE BY MOUTH EVERY DAY 01/01/19   Babs Sciara, MD  polyethylene glycol powder (GLYCOLAX/MIRALAX) 17 GM/SCOOP powder Take 17 g by mouth as directed. 09/12/19   [provider]  pravastatin (PRAVACHOL) 20 MG tablet Take 1 tablet (20 mg total) by mouth every evening. 05/19/22 05/14/23  Jonelle Sidle, MD  sodium chloride 1 g tablet Take 1 g by mouth in the morning, at noon, and at bedtime.    [provider]  Tiotropium Bromide-Olodaterol (STIOLTO RESPIMAT) 2.5-2.5 MCG/ACT AERS Inhale 2 puffs into the lungs daily. 08/15/19   Babs Sciara, MD      Allergies    Codeine, Losartan, Amlodipine, Mirtazapine, Morphine, Other, Statins, and Tramadol    Review of Systems   Review of Systems  Neurological:  Positive for weakness.    Physical Exam Updated Vital Signs BP (!) 175/59   Pulse 68   Temp 97.6 F (36.4 C) (  Oral)   Resp 17   SpO2 97%  Physical Exam  ED Results / Procedures / Treatments   Labs (all labs ordered are listed, but only abnormal results are displayed) Labs Reviewed  BASIC METABOLIC PANEL - Abnormal; Notable for the following components:      Result Value   Sodium 124 (*)    Chloride 93 (*)    Glucose, Bld 111 (*)    Calcium 8.2 (*)    All other components within normal limits  URINALYSIS, ROUTINE W REFLEX MICROSCOPIC - Abnormal; Notable for the following components:   Color, Urine STRAW (*)    Specific Gravity, Urine 1.002 (*)    Hgb urine dipstick SMALL (*)    Protein, ur 30 (*)    All other components  within normal limits  CBC    EKG None  Radiology No results found.  Procedures Procedures  {Document cardiac monitor, telemetry assessment procedure when appropriate:1}  Medications Ordered in ED Medications  sodium chloride 0.9 % bolus 1,000 mL (1,000 mLs Intravenous New Bag/Given 01/21/23 1922)    ED Course/ Medical Decision Making/ A&P   {   Click here for ABCD2, HEART and other calculatorsREFRESH Note before signing :1}                              Medical Decision Making Amount and/or Complexity of Data Reviewed Labs: ordered.  Risk Decision regarding hospitalization.   Patient with hyponatremia most likely causing his weakness.  He will be admitted to medicine  {Document critical care time when appropriate:1} {Document review of labs and clinical decision tools ie heart score, Chads2Vasc2 etc:1}  {Document your independent review of radiology images, and any outside records:1} {Document your discussion with family members, caretakers, and with consultants:1} {Document social determinants of health affecting pt's care:1} {Document your decision making why or why not admission, treatments were needed:1} Final Clinical Impression(s) / ED Diagnoses Final diagnoses:  Hyponatremia    Rx / DC Orders ED Discharge Orders     None

## 2023-01-21 NOTE — Progress Notes (Signed)
 Because this visit was a virtual/telehealth visit,  certain criteria was not obtained, such a blood pressure, CBG if applicable, and timed get up and go. Any medications not marked as "taking" were not mentioned during the medication reconciliation part of the visit. Any vitals not documented were not able to be obtained due to this being a telehealth visit or patient was unable to self-report a recent blood pressure reading due to a lack of equipment at home via telehealth. Vitals that have been documented are verbally provided by the patient.   Subjective:   [name] is a 82 y.o. male who presents for Medicare Annual/Subsequent preventive examination.  Visit Complete: Virtual  I connected with  [name] on 01/21/23 by a audio enabled telemedicine application and verified that I am speaking with the correct person using two identifiers.  Patient Location: Home  Provider Location: Home Office  I discussed the limitations of evaluation and management by telemedicine. The patient expressed understanding and agreed to proceed.  Patient Medicare AWV questionnaire was completed by the patient on na; I have confirmed that all information answered by patient is correct and no changes since this date.  Cardiac Risk Factors include: advanced age (>93men, >65 women);dyslipidemia;hypertension;male gender;sedentary lifestyle     Objective:    Today's Vitals   01/21/23 1540  Weight: 143 lb (64.9 kg)  Height: 5\' 7"  (1.702 m)   Body mass index is 22.4 kg/m.     01/21/2023    3:42 PM 11/04/2020    8:32 AM 09/29/2018    4:58 PM 09/01/2018    7:45 AM  Advanced Directives  Does Patient Have a Medical Advance Directive? No No No No  Would patient like information on creating a medical advance directive? No - Patient declined No - Patient declined No - Patient declined No - Patient declined    Current Medications (verified) Outpatient Encounter Medications as of 01/21/2023  Medication Sig    aspirin EC 81 MG tablet Take 81 mg by mouth daily.   cetirizine (ZYRTEC) 10 MG tablet Take 10 mg by mouth daily.   clobetasol cream (TEMOVATE) 0.05 % Apply 1 application topically 2 (two) times daily.   desmopressin (DDAVP) 0.1 MG tablet Take 0.1 mg by mouth 4 (four) times daily as needed (Pt takes 3 to 4 times daily).   ezetimibe (ZETIA) 10 MG tablet Take 1 tablet by mouth once daily   isosorbide mononitrate (IMDUR) 30 MG 24 hr tablet Take 1 tablet (30 mg total) by mouth 2 (two) times daily.   levothyroxine (SYNTHROID) 50 MCG tablet Take 50 mcg by mouth daily before breakfast.   metoprolol tartrate (LOPRESSOR) 25 MG tablet TAKE 1/2 (ONE-HALF) TABLET BY MOUTH TWICE DAILY -- NEEDS APPOINTMENT FOR FURTHER REFILLS   omeprazole (PRILOSEC) 20 MG capsule TAKE 1 CAPSULE BY MOUTH EVERY DAY   polyethylene glycol powder (GLYCOLAX/MIRALAX) 17 GM/SCOOP powder Take 17 g by mouth as directed.   pravastatin (PRAVACHOL) 20 MG tablet Take 1 tablet (20 mg total) by mouth every evening.   sodium chloride 1 g tablet Take 1 g by mouth in the morning, at noon, and at bedtime.   Tiotropium Bromide-Olodaterol (STIOLTO RESPIMAT) 2.5-2.5 MCG/ACT AERS Inhale 2 puffs into the lungs daily.   hydrOXYzine (ATARAX) 10 MG tablet Take 5 mg by mouth 3 (three) times daily as needed for itching. (Patient not taking: Reported on 01/21/2023)   levothyroxine (SYNTHROID, LEVOTHROID) 88 MCG tablet Take 88 mcg by mouth every other day. (Patient not  taking: Reported on 01/21/2023)   melatonin 1 MG TABS tablet Take 1 mg by mouth at bedtime. 1/2 tablet at bedtime (Patient not taking: Reported on 01/21/2023)   nitroGLYCERIN (NITROSTAT) 0.4 MG SL tablet Place 1 tablet (0.4 mg total) under the tongue every 5 (five) minutes as needed for chest pain (Call 911 if still having chest pain after taking 3rd tablet). (Patient not taking: Reported on 01/13/2023)   No facility-administered encounter medications on file as of 01/21/2023.    Allergies  (verified) Codeine, Losartan, Morphine, Mirtazapine, Other, Statins, and Tramadol   History: Past Medical History:  Diagnosis Date   Allergy    Arthritis    CAD (coronary artery disease)    Nonobstructive CAD with the exception of a nondominant RCA that was managed medically in April 2020.   Colon polyps    Adenomatous   Constipation    COPD (chronic obstructive pulmonary disease) (HCC)    Diverticulosis    Essential hypertension    GERD (gastroesophageal reflux disease)    History of anxiety    History of depression    Hypercholesterolemia    Hypothyroidism    Pituitary microadenoma (HCC)    Status post resection 2009   Prostate cancer (HCC)    Senile purpura (HCC) 10/23/2018   Past Surgical History:  Procedure Laterality Date   BACK SURGERY  1991   CHOLECYSTECTOMY  08/2007   COLONOSCOPY     LEFT HEART CATH AND CORONARY ANGIOGRAPHY N/A 09/01/2018   Procedure: LEFT HEART CATH AND CORONARY ANGIOGRAPHY;  Surgeon: Lennette Bihari, MD;  Location: [facility];  Service: Cardiovascular;  Laterality: N/A;   POLYPECTOMY     PROSTATE SURGERY     Removal of pituitary tumor  9/09   UPPER GASTROINTESTINAL ENDOSCOPY     Family History  Problem Relation Age of Onset   Stomach cancer Mother    Clotting disorder Father    Heart disease Father    Heart attack Father    Colon cancer Neg Hx    Diabetes Neg Hx    Colon polyps Neg Hx    Rectal cancer Neg Hx    Social History   Socioeconomic History   Marital status: Married    Spouse name: Not on file   Number of children: 2   Years of education: Not on file   Highest education level: Not on file  Occupational History   Occupation: Retired  Tobacco Use   Smoking status: Former    Types: Cigarettes   Smokeless tobacco: Never   Tobacco comments:    quit smoking in early 80's  Vaping Use   Vaping status: Never Used  Substance and Sexual Activity   Alcohol use: Not Currently    Alcohol/week: 0.0 standard drinks of  alcohol   Drug use: Not Currently   Sexual activity: Not on file  Other Topics Concern   Not on file  Social History Narrative   Married with 2 children   Right handed   12+   2-3 cups daily   Social Determinants of Health   Financial Resource Strain: Low Risk  (01/21/2023)   Overall Financial Resource Strain (CARDIA)    Difficulty of Paying Living Expenses: Not hard at all  Food Insecurity: No Food Insecurity (01/21/2023)   Hunger Vital Sign    Worried About Running Out of Food in the Last Year: Never true    Ran Out of Food in the Last Year: Never true  Transportation Needs: No  Transportation Needs (01/21/2023)   PRAPARE - Administrator, Civil Service (Medical): No    Lack of Transportation (Non-Medical): No  Physical Activity: Inactive (01/21/2023)   Exercise Vital Sign    Days of Exercise per Week: 0 days    Minutes of Exercise per Session: 0 min  Stress: Stress Concern Present (01/21/2023)   [employer] of Occupational Health - Occupational Stress Questionnaire    Feeling of Stress : Rather much  Social Connections: Moderately Isolated (01/21/2023)   Social Connection and Isolation Panel [NHANES]    Frequency of Communication with Friends and Family: Once a week    Frequency of Social Gatherings with Friends and Family: Once a week    Attends Religious Services: More than 4 times per year    Active Member of [employer] or Organizations: No    Attends Engineer, structural: Never    Marital Status: Married    Tobacco Counseling Counseling given: Yes Tobacco comments: quit smoking in early 80's   Clinical Intake:  Pre-visit preparation completed: Yes  Pain : No/denies pain     BMI - recorded: 22.4 Nutritional Status: BMI of 19-24  Normal Nutritional Risks: None Diabetes: No  How often do you need to have someone help you when you read instructions, pamphlets, or other written materials from your doctor or pharmacy?: 1 - Never  Interpreter  Needed?: No  Information entered by ::  Townes Fuhs, CMA   Activities of Daily Living    01/21/2023    3:41 PM  In your present state of health, do you have any difficulty performing the following activities:  Hearing? 1  Comment wears hearing aids  Vision? 0  Difficulty concentrating or making decisions? 0  Walking or climbing stairs? 0  Dressing or bathing? 0  Doing errands, shopping? 0  Preparing Food and eating ? N  Using the Toilet? N  In the past six months, have you accidently leaked urine? N  Do you have problems with loss of bowel control? N  Managing your Medications? N  Managing your Finances? N  Housekeeping or managing your Housekeeping? N    Patient Care Team: Babs Sciara, MD as PCP - General (Family Medicine) Jonelle Sidle, MD as PCP - Cardiology (Cardiology)  Indicate any recent Medical Services you may have received from other than [facility] providers in the past year (date may be approximate).     Assessment:   This is a routine wellness examination for Dequon.  Hearing/Vision screen Hearing Screening - Comments:: Patient wears hearing aids Vision Screening - Comments:: Patient is up to date with yearly eye exams. He gets his exams through the [place]   Goals Addressed             This Visit's Progress    Patient Stated       To get some sleep        Depression Screen    01/21/2023    3:42 PM 07/28/2022    8:56 AM 01/19/2022   10:04 AM 11/05/2021   10:34 AM 11/04/2020    8:34 AM 07/30/2020    9:58 AM 04/01/2020   11:47 AM  PHQ 2/9 Scores  PHQ - 2 Score 4 4 2  0 0 0 0  PHQ- 9 Score 16 19 13         Fall Risk    01/21/2023    3:49 PM 07/28/2022    8:56 AM 01/19/2022   10:04 AM 11/05/2021   10:33  AM 12/01/2020    9:32 AM  Fall Risk   Falls in the past year? 0 1 0 0 0  Number falls in past yr: 0 0 0 0 0  Injury with Fall? 0 0 0 0 0  Risk for fall due to : No Fall Risks;Impaired balance/gait;Impaired mobility Impaired balance/gait No Fall  Risks No Fall Risks No Fall Risks  Follow up Falls prevention discussed;Education provided Falls evaluation completed Falls evaluation completed Falls evaluation completed Falls evaluation completed    MEDICARE RISK AT HOME: Medicare Risk at Home Any stairs in or around the home?: No If so, are there any without handrails?: No Home free of loose throw rugs in walkways, pet beds, electrical cords, etc?: Yes Adequate lighting in your home to reduce risk of falls?: Yes Life alert?: No Use of a cane, walker or w/c?: Yes Grab bars in the bathroom?: No Shower chair or bench in shower?: No Elevated toilet seat or a handicapped toilet?: No  TIMED UP AND GO:  Was the test performed?  No    Cognitive Function:        01/21/2023    3:50 PM  6CIT Screen  What Year? 0 points  What month? 0 points  What time? 0 points  Count back from 20 0 points  Months in reverse 0 points  Repeat phrase 0 points  Total Score 0 points    Immunizations Immunization History  Administered Date(s) Administered   DT (Pediatric) 07/10/2013   Fluad Quad(high Dose 65+) 01/19/2022   Influenza, High Dose Seasonal PF 02/08/2016, 03/10/2018, 01/30/2019   Influenza, Seasonal, Injecte, Preservative Fre 02/18/2016   Influenza,inj,Quad PF,6+ Mos 02/01/2014, 03/05/2015, 03/09/2017   Influenza-Unspecified 03/10/2018, 01/30/2019, 01/09/2020, 02/01/2020, 01/08/2021   Moderna Sars-Covid-2 Vaccination 05/22/2019, 06/22/2019, 03/20/2020, 03/31/2020   Pneumococcal Conjugate-13 07/15/2014   Pneumococcal Polysaccharide-23 07/10/2013   Tdap 10/23/2018   Zoster Recombinant(Shingrix) 01/01/2019, 03/27/2019    TDAP status: Up to date  Flu Vaccine status: Due, Education has been provided regarding the importance of this vaccine. Advised may receive this vaccine at local pharmacy or Health Dept. Aware to provide a copy of the vaccination record if obtained from local pharmacy or Health Dept. Verbalized acceptance and  understanding.  Pneumococcal vaccine status: Up to date  Covid-19 vaccine status: Information provided on how to obtain vaccines.   Qualifies for Shingles Vaccine? Yes   Zostavax completed Yes   Shingrix Completed?: Yes  Screening Tests Health Maintenance  Topic Date Due   INFLUENZA VACCINE  12/09/2022   COVID-19 Vaccine (5 - 2023-24 season) 01/09/2023   Medicare Annual Wellness (AWV)  01/20/2023   DTaP/Tdap/Td (3 - Td or Tdap) 10/22/2028   Pneumonia Vaccine 15+ Years old  Completed   Zoster Vaccines- Shingrix  Completed   HPV VACCINES  Aged Out   Colonoscopy  Discontinued    Health Maintenance  Health Maintenance Due  Topic Date Due   INFLUENZA VACCINE  12/09/2022   COVID-19 Vaccine (5 - 2023-24 season) 01/09/2023   Medicare Annual Wellness (AWV)  01/20/2023    Colorectal cancer screening: No longer required.   Lung Cancer Screening: (Low Dose CT Chest recommended if Age 57-80 years, 20 pack-year currently smoking OR have quit w/in 15years.) does not qualify.     Additional Screening:  Hepatitis C Screening: does not qualify;  Vision Screening: Recommended annual ophthalmology exams for early detection of glaucoma and other disorders of the eye. Is the patient up to date with their annual eye exam?  Yes  Who is the provider or what is the [facility] of the [facility] in which the patient [facility] If pt is not established with a provider, would they like to be referred to a provider to establish care? No .   Dental Screening: Recommended annual dental exams for proper oral hygiene  Diabetic Foot Exam: na  Community Resource Referral / Chronic Care Management: CRR required this visit?  Yes   CCM required this visit?  No     Plan:     I have personally reviewed and noted the following in the patient's chart:   Medical and social history Use of alcohol, tobacco or illicit drugs  Current medications and supplements including opioid  prescriptions. Patient is not currently taking opioid prescriptions. Functional ability and status Nutritional status Physical activity Advanced directives List of other physicians Hospitalizations, surgeries, and ER visits in previous 12 months Vitals Screenings to include cognitive, depression, and falls Referrals and appointments  In addition, I have reviewed and discussed with patient certain preventive protocols, quality metrics, and best practice recommendations. A written personalized care plan for preventive services as well as general preventive health recommendations were provided to patient.     Jordan Hawks Catha Ontko, CMA   01/21/2023   After Visit Summary: (MyChart) Due to this being a telephonic visit, the after visit summary with patients personalized plan was offered to patient via MyChart   Nurse Notes:

## 2023-01-21 NOTE — ED Notes (Signed)
Pt ambulated to restroom with steady gait.

## 2023-01-21 NOTE — Assessment & Plan Note (Signed)
-  secondary to hyponatremia most likely -see plan for hyponatremia -consult PT eval and treat

## 2023-01-21 NOTE — ED Notes (Signed)
PLEASE DO NOT USE tape on pt., it takes off is skin

## 2023-01-22 DIAGNOSIS — R531 Weakness: Secondary | ICD-10-CM

## 2023-01-22 DIAGNOSIS — E871 Hypo-osmolality and hyponatremia: Principal | ICD-10-CM

## 2023-01-22 DIAGNOSIS — E038 Other specified hypothyroidism: Secondary | ICD-10-CM | POA: Diagnosis not present

## 2023-01-22 DIAGNOSIS — F419 Anxiety disorder, unspecified: Secondary | ICD-10-CM

## 2023-01-22 DIAGNOSIS — E7800 Pure hypercholesterolemia, unspecified: Secondary | ICD-10-CM

## 2023-01-22 DIAGNOSIS — R1319 Other dysphagia: Secondary | ICD-10-CM

## 2023-01-22 DIAGNOSIS — I25119 Atherosclerotic heart disease of native coronary artery with unspecified angina pectoris: Secondary | ICD-10-CM

## 2023-01-22 LAB — COMPREHENSIVE METABOLIC PANEL
ALT: 14 U/L (ref 0–44)
AST: 18 U/L (ref 15–41)
Albumin: 3.6 g/dL (ref 3.5–5.0)
Alkaline Phosphatase: 71 U/L (ref 38–126)
Anion gap: 9 (ref 5–15)
BUN: 10 mg/dL (ref 8–23)
CO2: 22 mmol/L (ref 22–32)
Calcium: 9 mg/dL (ref 8.9–10.3)
Chloride: 104 mmol/L (ref 98–111)
Creatinine, Ser: 0.82 mg/dL (ref 0.61–1.24)
GFR, Estimated: >60 mL/min (ref >60)
Glucose, Bld: 98 mg/dL (ref 70–99)
Potassium: 4.4 mmol/L (ref 3.5–5.1)
Sodium: 135 mmol/L (ref 135–145)
Total Bilirubin: 1.3 mg/dL — ABNORMAL HIGH (ref 0.3–1.2)
Total Protein: 7 g/dL (ref 6.5–8.1)

## 2023-01-22 LAB — CBC WITH DIFFERENTIAL/PLATELET
Abs Immature Granulocytes: 0.03 10*3/uL (ref 0.00–0.07)
Basophils Absolute: 0.1 10*3/uL (ref 0.0–0.1)
Basophils Relative: 1 %
Eosinophils Absolute: 0.4 10*3/uL (ref 0.0–0.5)
Eosinophils Relative: 6 %
HCT: 43.6 % (ref 39.0–52.0)
Hemoglobin: 14.7 g/dL (ref 13.0–17.0)
Immature Granulocytes: 0 %
Lymphocytes Relative: 26 %
Lymphs Abs: 1.7 10*3/uL (ref 0.7–4.0)
MCH: 29.9 pg (ref 26.0–34.0)
MCHC: 33.7 g/dL (ref 30.0–36.0)
MCV: 88.6 fL (ref 80.0–100.0)
Monocytes Absolute: 0.7 10*3/uL (ref 0.1–1.0)
Monocytes Relative: 11 %
Neutro Abs: 3.8 10*3/uL (ref 1.7–7.7)
Neutrophils Relative %: 56 %
Platelets: 157 10*3/uL (ref 150–400)
RBC: 4.92 MIL/uL (ref 4.22–5.81)
RDW: 12.6 % (ref 11.5–15.5)
WBC: 6.7 10*3/uL (ref 4.0–10.5)
nRBC: 0 % (ref 0.0–0.2)

## 2023-01-22 LAB — OSMOLALITY: Osmolality: 287 mosm/kg (ref 275–295)

## 2023-01-22 LAB — OSMOLALITY, URINE: Osmolality, Ur: 125 mosm/kg — ABNORMAL LOW (ref 300–900)

## 2023-01-22 LAB — MAGNESIUM: Magnesium: 2.1 mg/dL (ref 1.7–2.4)

## 2023-01-22 LAB — TROPONIN I (HIGH SENSITIVITY): Troponin I (High Sensitivity): 5 ng/L (ref <18)

## 2023-01-22 LAB — TSH: TSH: 4.24 u[IU]/mL (ref 0.350–4.500)

## 2023-01-22 MED ORDER — TEMAZEPAM 15 MG PO CAPS
15.0000 mg | ORAL_CAPSULE | Freq: Once | ORAL | Status: AC
  Administered 2023-01-22: 15 mg via ORAL
  Filled 2023-01-22: qty 1

## 2023-01-22 MED ORDER — DESMOPRESSIN ACETATE 0.2 MG PO TABS
ORAL_TABLET | ORAL | Status: AC
  Filled 2023-01-22: qty 1

## 2023-01-22 MED ORDER — SODIUM CHLORIDE 1 G PO TABS: 1.0000 g | ORAL_TABLET | Freq: Three times a day (TID) | ORAL | 1 refills | Status: AC

## 2023-01-22 MED ORDER — DESMOPRESSIN ACETATE 0.1 MG PO TABS
0.1000 mg | ORAL_TABLET | Freq: Once | ORAL | Status: AC
  Administered 2023-01-22: 0.1 mg via ORAL
  Filled 2023-01-22: qty 1

## 2023-01-22 NOTE — Hospital Course (Signed)
[name] is a 82 y.o. male with medical history significant of coronary artery disease, COPD, GERD, hypothyroidism, hypercholesterolemia, hyponatremia, hypertension, and more presents the ED with a chief complaint of generalized weakness and shakiness.  Patient reports the symptoms have been present for 1-2 weeks.  They have been progressively worse since they started.  He reports he has had episodes of hypoglycemia in the past in which he has been disoriented and confused and he started feeling like that a couple of days ago.  He reports that he sometimes has palpitations but that is chronic.  He reports dizziness, decreased appetite.  He has not had any nausea or abdominal pain.  He reports that he has not been taking his sodium tabs at home because his sodium had normalized.  He stopped taking them several weeks ago and the symptoms started 1-2 weeks ago.  Patient went to the ophthalmologist and had some blood work done and then was called and told to come into the ED for hyponatremia.  On review of systems patient does report insomnia but reports that that is been going on for years.  He has tried gabapentin, melatonin, and trazodone in the past.  He is not sure what other medications he has tried for it.      ED Temp 97.6, heart rate 64-68, respiratory rate 14-17, blood pressure 161/59-175/62 satting 97-98% No leukocytosis with a white blood cell count of 7.3, hemoglobin 15.0, platelets 152 Hyponatremic at 124, (previously 137) UA is not indicative of UTI EKG shows a heart rate of 65, sinus rhythm, QTc 451 Admission requested for hyponatremia with generalized weakness

## 2023-01-22 NOTE — Evaluation (Signed)
Physical Therapy Evaluation Patient Details Name: ALBERTUS MERRIAM MRN: [id number] DOB: 1941-04-20 Today's Date: 01/22/2023  History of Present Illness  BRIA HUR is a 82 y.o. male with medical history significant of coronary artery disease, COPD, GERD, hypothyroidism, hypercholesterolemia, hyponatremia, hypertension, and more presents the ED with a chief complaint of generalized weakness and shakiness.  Patient reports the symptoms have been present for 1-2 weeks.  They have been progressively worse since they started.  He reports he has had episodes of hypoglycemia in the past in which he has been disoriented and confused and he started feeling like that a couple of days ago.  He reports that he sometimes has palpitations but that is chronic.  He reports dizziness, decreased appetite.  He has not had any nausea or abdominal pain.  He reports that he has not been taking his sodium tabs at home because his sodium had normalized.  He stopped taking them several weeks ago and the symptoms started 1-2 weeks ago.  Patient went to the ophthalmologist and had some blood work done and then was called and told to come into the ED for hyponatremia.  On review of systems patient does report insomnia but reports that that is been going on for years.  He has tried gabapentin, melatonin, and trazodone in the past.  He is not sure what other medications he has tried for it.    Clinical Impression  Patient lying in bed on therapist arrival.  He is pleasant and agreeable to therapy assessment.  Patient performs supine to sit with modified assistance; taking extra time; HOB slightly elevated.  Patient able to sit  on edge of bed with good sitting balance.  Sit to stand with CGA for therapist for safety; he reports no dizziness so ambulates out of the room into the hallway and back x 75 ft; no AD. Patient with mild increased trunk sway but no loss of balance with turns.  Demonstrates overall decreased gait speed and wide base  of support due to general weakness.  patient left in bed with call button in reach and nursing notified of mobility status.  Patient will benefit from continued skilled therapy services during the remainder of his hospital stay and at the next recommended venue of care to address deficits and promote return to optimal function.             If plan is discharge home, recommend the following: A little help with walking and/or transfers;Help with stairs or ramp for entrance;A little help with bathing/dressing/bathroom   Can travel by private vehicle        Equipment Recommendations None recommended by PT  Recommendations for Other Services       Functional Status Assessment Patient has had a recent decline in their functional status and demonstrates the ability to make significant improvements in function in a reasonable and predictable amount of time.     Precautions / Restrictions Precautions Precautions: Fall Restrictions Weight Bearing Restrictions: No      Mobility  Bed Mobility Overal bed mobility: Modified Independent             General bed mobility comments: takes extra time Patient Response: Cooperative  Transfers Overall transfer level: Modified independent Equipment used: None               General transfer comment: takes extra time    Ambulation/Gait Ambulation/Gait assistance: Contact guard assist Gait Distance (Feet): 75 Feet Assistive device: None Gait Pattern/deviations: Wide base of  support, Drifts right/left       General Gait Details: mild balance impairment with turns  Careers information officer     Tilt Bed Tilt Bed Patient Response: Cooperative  Modified Rankin (Stroke Patients Only)       Balance Overall balance assessment: Needs assistance Sitting-balance support: Feet supported Sitting balance-Leahy Scale: Good       Standing balance-Leahy Scale: Fair Standing balance comment: fair standing  balance; CGA from therapist for safety; has mild increased trunk sway with turns                             Pertinent Vitals/Pain Pain Assessment Pain Assessment: No/denies pain    Home Living Family/patient expects to be discharged to:: Private residence Living Arrangements: Spouse/significant other Available Help at Discharge: Family;Available 24 hours/day Type of Home: House Home Access: Stairs to enter Entrance Stairs-Rails: None Entrance Stairs-Number of Steps: 3   Home Layout: One level Home Equipment: Cane - single point;Grab bars - tub/shower      Prior Function Prior Level of Function : Independent/Modified Independent                     Extremity/Trunk Assessment   Upper Extremity Assessment Upper Extremity Assessment: Generalized weakness    Lower Extremity Assessment Lower Extremity Assessment: Generalized weakness    Cervical / Trunk Assessment Cervical / Trunk Assessment: Normal  Communication   Communication Communication: No apparent difficulties  Cognition Arousal: Alert Behavior During Therapy: WFL for tasks assessed/performed Overall Cognitive Status: Within Functional Limits for tasks assessed                                          General Comments      Exercises     Assessment/Plan    PT Assessment Patient needs continued PT services  PT Problem List Decreased strength;Decreased balance       PT Treatment Interventions Gait training;Therapeutic activities;Therapeutic exercise;Balance training    PT Goals (Current goals can be found in the Care Plan section)  Acute Rehab PT Goals Patient Stated Goal: return home PT Goal Formulation: With patient Time For Goal Achievement: 02/05/23 Potential to Achieve Goals: Good    Frequency Min 2X/week     Co-evaluation               AM-PAC PT "6 Clicks" Mobility  Outcome Measure Help needed turning from your back to your side while in a flat  bed without using bedrails?: None Help needed moving from lying on your back to sitting on the side of a flat bed without using bedrails?: None Help needed moving to and from a bed to a chair (including a wheelchair)?: A Little Help needed standing up from a chair using your arms (e.g., wheelchair or bedside chair)?: A Little Help needed to walk in hospital room?: A Little Help needed climbing 3-5 steps with a railing? : A Lot 6 Click Score: 19    End of Session   Activity Tolerance: Patient tolerated treatment well Patient left: in bed;with call bell/phone within reach Nurse Communication: Mobility status PT Visit Diagnosis: Unsteadiness on feet (R26.81);Muscle weakness (generalized) (M62.81)    Time: 4401-0272 PT Time Calculation (min) (ACUTE ONLY): 18 min   Charges:   PT Evaluation $PT  Eval Low Complexity: 1 Low   PT General Charges $$ ACUTE PT VISIT: 1 Visit         9:45 AM, 01/22/23 Roniesha Hollingshead Small Tyrell Seifer MPT [facility] [place] [phone]

## 2023-01-22 NOTE — Assessment & Plan Note (Signed)
Continue atarax

## 2023-01-22 NOTE — Discharge Summary (Signed)
Physician Discharge Summary   Patient: [name] MRN: [id number] DOB: 03-24-1941  Admit date:     01/21/2023  Discharge date: 01/22/23  Discharge Physician: Kendell Bane   PCP: Babs Sciara, MD   Recommendations at discharge:    Follow-up with PCP and neurologist in 1 week -Continue taking your supplements tablets  Discharge Diagnoses: Principal Problem:   Acute hyponatremia Active Problems:   HYPERCHOLESTEROLEMIA   Hypothyroidism   Coronary artery disease involving native coronary artery of native heart with angina pectoris (HCC)   Generalized weakness   Anxiety  Resolved Problems:   Dysphagia  Hospital Course: [name] is a 82 y.o. male with medical history significant of coronary artery disease, COPD, GERD, hypothyroidism, hypercholesterolemia, hyponatremia, hypertension, and more presents the ED with a chief complaint of generalized weakness and shakiness.  Patient reports the symptoms have been present for 1-2 weeks.  They have been progressively worse since they started.  He reports he has had episodes of hypoglycemia in the past in which he has been disoriented and confused and he started feeling like that a couple of days ago.  He reports that he sometimes has palpitations but that is chronic.  He reports dizziness, decreased appetite.  He has not had any nausea or abdominal pain.  He reports that he has not been taking his sodium tabs at home because his sodium had normalized.  He stopped taking them several weeks ago and the symptoms started 1-2 weeks ago.  Patient went to the ophthalmologist and had some blood work done and then was called and told to come into the ED for hyponatremia.  On review of systems patient does report insomnia but reports that that is been going on for years.  He has tried gabapentin, melatonin, and trazodone in the past.  He is not sure what other medications he has tried for it.      ED Temp 97.6, heart rate 64-68, respiratory rate  14-17, blood pressure 161/59-175/62 satting 97-98% No leukocytosis with a white blood cell count of 7.3, hemoglobin 15.0, platelets 152 Hyponatremic at 124, (previously 137) UA is not indicative of UTI EKG shows a heart rate of 65, sinus rhythm, QTc 451 Admission requested for hyponatremia with generalized weakness      * Acute hyponatremia -Na 127 >>> 135 now  -Continue home Na tabs -Check BMP at midnight and in the AM -Goal Na 130   Anxiety -Continue atarax  Generalized weakness -secondary to hyponatremia most likely -stable now    Coronary artery disease involving native coronary artery of native heart with angina pectoris (HCC) -Continue asa, imdur, beta blocker, statin    Hypothyroidism -Continue synthroid -TSH - WNL   HYPERCHOLESTEROLEMIA -Continue statin   Disposition: Home Diet recommendation:  Discharge Diet Orders (From admission, onward)     Start     Ordered   01/22/23 0000  Diet - low sodium heart healthy        01/22/23 1034           Regular diet DISCHARGE MEDICATION: Allergies as of 01/22/2023       Reactions   Codeine Nausea Only, Nausea And Vomiting   Losartan    Patient had elevated potassium and also slight elevation of creatinine-October 2017   Amlodipine Nausea And Vomiting   Complained of neck pain with medication-November 2017 not allergic   Mirtazapine    Severe drowsiness   Morphine Swelling   Chest pain   Statins Other (See  Comments)   Pt is handling statins well now   Tramadol Nausea And Vomiting   Sweat, light-headed        Medication List     STOP taking these medications    traZODone 50 MG tablet Commonly known as: DESYREL       TAKE these medications    aspirin EC 81 MG tablet Take 81 mg by mouth daily.   cetirizine 10 MG tablet Commonly known as: ZYRTEC Take 10 mg by mouth daily.   clobetasol cream 0.05 % Commonly known as: TEMOVATE Apply 1 application topically 2 (two) times daily.    desmopressin 0.1 MG tablet Commonly known as: DDAVP Take 0.1 mg by mouth 4 (four) times daily as needed (Pt takes 3 to 4 times daily).   ENSURE COMPLETE PO Take 1 Bottle by mouth daily.   gabapentin 100 MG capsule Commonly known as: NEURONTIN Take 200 mg by mouth at bedtime.   hydrocortisone cream 0.5 % Apply 1 Application topically 2 (two) times daily.   isosorbide mononitrate 30 MG 24 hr tablet Commonly known as: IMDUR Take 1 tablet (30 mg total) by mouth 2 (two) times daily. What changed: how much to take   levothyroxine 50 MCG tablet Commonly known as: SYNTHROID Take 50 mcg by mouth daily before breakfast.   melatonin 1 MG Tabs tablet Take 1 mg by mouth at bedtime. 1/2 tablet at bedtime   metoprolol tartrate 25 MG tablet Commonly known as: LOPRESSOR TAKE 1/2 (ONE-HALF) TABLET BY MOUTH TWICE DAILY -- NEEDS APPOINTMENT FOR FURTHER REFILLS   nitroGLYCERIN 0.4 MG SL tablet Commonly known as: NITROSTAT Place 1 tablet (0.4 mg total) under the tongue every 5 (five) minutes as needed for chest pain (Call 911 if still having chest pain after taking 3rd tablet).   omeprazole 20 MG capsule Commonly known as: PRILOSEC TAKE 1 CAPSULE BY MOUTH EVERY DAY   polyethylene glycol powder 17 GM/SCOOP powder Commonly known as: GLYCOLAX/MIRALAX Take 17 g by mouth as directed.   pravastatin 20 MG tablet Commonly known as: PRAVACHOL Take 1 tablet (20 mg total) by mouth every evening.   sodium chloride 1 g tablet Take 1 tablet (1 g total) by mouth in the morning, at noon, and at bedtime.   Stiolto Respimat 2.5-2.5 MCG/ACT Aers Generic drug: Tiotropium Bromide-Olodaterol Inhale 2 puffs into the lungs daily.   SYSTANE ULTRA OP Place 1 drop into both eyes as needed (itchy eyes).        Discharge Exam: Filed Weights   01/21/23 2133  Weight: 63.7 kg       General:  AAO x 3,  cooperative, no distress;   HEENT:  Normocephalic, PERRL, otherwise with in Normal limits   Neuro:   CNII-XII intact. , normal motor and sensation, reflexes intact   Lungs:   Clear to auscultation BL, Respirations unlabored,  No wheezes / crackles  Cardio:    S1/S2, RRR, No murmure, No Rubs or Gallops   Abdomen:  Soft, non-tender, bowel sounds active all four quadrants, no guarding or peritoneal signs.  Muscular  skeletal:  Limited exam -global generalized weaknesses - in bed, able to move all 4 extremities,   2+ pulses,  symmetric, No pitting edema  Skin:  Dry, warm to touch, negative for any Rashes,  Wounds: Please see nursing documentation         Condition at discharge: good  The results of significant diagnostics from this hospitalization (including imaging, microbiology, ancillary and laboratory) are listed below for  reference.   Imaging Studies: No results found.  Microbiology: Results for orders placed or performed in visit on 04/24/21  Urine Culture     Status: None   Collection Time: 04/24/21 12:00 AM   Specimen: Urine   Urine  Result Value Ref Range Status   Urine Culture, Routine Final report  Final   Organism ID, Bacteria Comment  Final    Comment: Mixed urogenital flora Less than 10,000 colonies/mL     Labs: CBC: Recent Labs  Lab 01/21/23 1723 01/22/23 0501  WBC 7.3 6.7  NEUTROABS  --  3.8  HGB 15.0 14.7  HCT 44.6 43.6  MCV 88.7 88.6  PLT 152 157   Basic Metabolic Panel: Recent Labs  Lab 01/21/23 1723 01/21/23 2246 01/22/23 0501  NA 124* 130* 135  K 4.0 4.6 4.4  CL 93* 97* 104  CO2 25 26 22   GLUCOSE 111* 116* 98  BUN 11 11 10   CREATININE 0.90 0.87 0.82  CALCIUM 8.2* 8.8* 9.0  MG  --   --  2.1   Liver Function Tests: Recent Labs  Lab 01/22/23 0501  AST 18  ALT 14  ALKPHOS 71  BILITOT 1.3*  PROT 7.0  ALBUMIN 3.6   CBG: Recent Labs  Lab 01/21/23 2218 01/21/23 2251  GLUCAP 67* 83    Discharge time spent: greater than 30 minutes.  Signed: Kendell Bane, MD [facility] 01/22/2023

## 2023-01-22 NOTE — H&P (Signed)
History and Physical    Patient: [name] QIH:[id number] DOB: 1940/05/16 DOA: 01/21/2023 DOS: the patient was seen and examined on 01/22/2023 PCP: Babs Sciara, MD  Patient coming from: Home  Chief Complaint:  Chief Complaint  Patient presents with   abnormal labs   HPI: [name] is a 82 y.o. male with medical history significant of coronary artery disease, COPD, GERD, hypothyroidism, hypercholesterolemia, hyponatremia, hypertension, and more presents the ED with a chief complaint of generalized weakness and shakiness.  Patient reports the symptoms have been present for 1-2 weeks.  They have been progressively worse since they started.  He reports he has had episodes of hypoglycemia in the past in which he has been disoriented and confused and he started feeling like that a couple of days ago.  He reports that he sometimes has palpitations but that is chronic.  He reports dizziness, decreased appetite.  He has not had any nausea or abdominal pain.  He reports that he has not been taking his sodium tabs at home because his sodium had normalized.  He stopped taking them several weeks ago and the symptoms started 1-2 weeks ago.  Patient went to the ophthalmologist and had some blood work done and then was called and told to come into the ED for hyponatremia.  On review of systems patient does report insomnia but reports that that is been going on for years.  He has tried gabapentin, melatonin, and trazodone in the past.  He is not sure what other medications he has tried for it.  Patient does not smoke and does not drink.  Patient is full code. Review of Systems: As mentioned in the history of present illness. All other systems reviewed and are negative. Past Medical History:  Diagnosis Date   Allergy    Arthritis    CAD (coronary artery disease)    Nonobstructive CAD with the exception of a nondominant RCA that was managed medically in April 2020.   Colon polyps    Adenomatous    Constipation    COPD (chronic obstructive pulmonary disease) (HCC)    Diverticulosis    Essential hypertension    GERD (gastroesophageal reflux disease)    History of anxiety    History of depression    Hypercholesterolemia    Hypothyroidism    Pituitary microadenoma (HCC)    Status post resection 2009   Prostate cancer (HCC)    Senile purpura (HCC) 10/23/2018   Past Surgical History:  Procedure Laterality Date   BACK SURGERY  1991   CHOLECYSTECTOMY  08/2007   COLONOSCOPY     LEFT HEART CATH AND CORONARY ANGIOGRAPHY N/A 09/01/2018   Procedure: LEFT HEART CATH AND CORONARY ANGIOGRAPHY;  Surgeon: Lennette Bihari, MD;  Location: [facility];  Service: Cardiovascular;  Laterality: N/A;   POLYPECTOMY     PROSTATE SURGERY     Removal of pituitary tumor  9/09   UPPER GASTROINTESTINAL ENDOSCOPY     Social History:  reports that he has quit smoking. His smoking use included cigarettes. He has never used smokeless tobacco. He reports that he does not currently use alcohol. He reports that he does not currently use drugs.  Allergies  Allergen Reactions   Codeine Nausea Only and Nausea And Vomiting   Losartan     Patient had elevated potassium and also slight elevation of creatinine-October 2017   Amlodipine Nausea And Vomiting    Complained of neck pain with medication-November 2017 not allergic  Mirtazapine     Severe drowsiness   Morphine Swelling    Chest pain   Statins Other (See Comments)    Pt is handling statins well now   Tramadol Nausea And Vomiting    Sweat, light-headed    Family History  Problem Relation Age of Onset   Stomach cancer Mother    Clotting disorder Father    Heart disease Father    Heart attack Father    Colon cancer Neg Hx    Diabetes Neg Hx    Colon polyps Neg Hx    Rectal cancer Neg Hx     Prior to Admission medications   Medication Sig Start Date End Date Taking? Authorizing Provider  aspirin EC 81 MG tablet Take 81 mg by mouth daily.    Yes [provider]  cetirizine (ZYRTEC) 10 MG tablet Take 10 mg by mouth daily.   Yes [provider]  clobetasol cream (TEMOVATE) 0.05 % Apply 1 application topically 2 (two) times daily.   Yes [provider]  desmopressin (DDAVP) 0.1 MG tablet Take 0.1 mg by mouth 4 (four) times daily as needed (Pt takes 3 to 4 times daily).   Yes [provider]  gabapentin (NEURONTIN) 100 MG capsule Take 200 mg by mouth at bedtime. 11/08/22  Yes [provider]  hydrocortisone cream 0.5 % Apply 1 Application topically 2 (two) times daily.   Yes [provider]  isosorbide mononitrate (IMDUR) 30 MG 24 hr tablet Take 1 tablet (30 mg total) by mouth 2 (two) times daily. Patient taking differently: Take 60 mg by mouth 2 (two) times daily. 04/21/20  Yes Jonelle Sidle, MD  levothyroxine (SYNTHROID) 50 MCG tablet Take 50 mcg by mouth daily before breakfast.   Yes [provider]  melatonin 1 MG TABS tablet Take 1 mg by mouth at bedtime. 1/2 tablet at bedtime   Yes [provider]  metoprolol tartrate (LOPRESSOR) 25 MG tablet TAKE 1/2 (ONE-HALF) TABLET BY MOUTH TWICE DAILY -- NEEDS APPOINTMENT FOR FURTHER REFILLS 02/15/19  Yes Jonelle Sidle, MD  nitroGLYCERIN (NITROSTAT) 0.4 MG SL tablet Place 1 tablet (0.4 mg total) under the tongue every 5 (five) minutes as needed for chest pain (Call 911 if still having chest pain after taking 3rd tablet). 03/16/22  Yes [employer]med[employer], Alvino Chapel, FNP  Nutritional Supplements (ENSURE COMPLETE PO) Take 1 Bottle by mouth daily. 06/21/22  Yes [provider]  omeprazole (PRILOSEC) 20 MG capsule TAKE 1 CAPSULE BY MOUTH EVERY DAY 01/01/19  Yes Luking, Jonna Coup, MD  Polyethyl Glycol-Propyl Glycol (SYSTANE ULTRA OP) Place 1 drop into both eyes as needed (itchy eyes).   Yes [provider]  polyethylene glycol powder (GLYCOLAX/MIRALAX) 17 GM/SCOOP powder Take 17 g by mouth as directed. 09/12/19  Yes  [provider]  pravastatin (PRAVACHOL) 20 MG tablet Take 1 tablet (20 mg total) by mouth every evening. 05/19/22 05/14/23 Yes Jonelle Sidle, MD  sodium chloride 1 g tablet Take 1 g by mouth in the morning, at noon, and at bedtime.   Yes [provider]  Tiotropium Bromide-Olodaterol (STIOLTO RESPIMAT) 2.5-2.5 MCG/ACT AERS Inhale 2 puffs into the lungs daily. 08/15/19  Yes Babs Sciara, MD  traZODone (DESYREL) 50 MG tablet Take 25 mg by mouth at bedtime. Do NOT take with melatonin Patient not taking: Reported on 01/21/2023 01/20/23   [provider]    Physical Exam: Vitals:   01/21/23 6387 01/21/23 2214 01/21/23 2241 01/22/23 5643  BP: (!) 166/59 (!) 175/67  113/62  Pulse: 63 67  61  Resp: 20 20  16   Temp: 98 F (36.7 C) (!) 97.5 F (36.4 C)  (!) 97.3 F (36.3 C)  TempSrc:    Oral  SpO2: 100% 100% 100% 95%  Weight:      Height:       1.  General: Patient lying supine in bed,  no acute distress   2. Psychiatric: Alert and oriented x 3, mood and behavior normal for situation, pleasant and cooperative with exam   3. Neurologic: Speech and language are normal, face is symmetric, moves all 4 extremities voluntarily, at baseline without acute deficits on limited exam   4. HEENMT:  Head is atraumatic, normocephalic, pupils reactive to light, neck is supple, trachea is midline, mucous membranes are moist   5. Respiratory : Lungs are clear to auscultation bilaterally without wheezing, rhonchi, rales, no cyanosis, no increase in work of breathing or accessory muscle use   6. Cardiovascular : Heart rate normal, rhythm is regular, no murmurs, rubs or gallops, no peripheral edema, peripheral pulses palpated   7. Gastrointestinal:  Abdomen is soft, nondistended, nontender to palpation bowel sounds active, no masses or organomegaly palpated   8. Skin:  Skin is warm, dry and intact without rashes, acute lesions, or ulcers on limited exam    9.Musculoskeletal:  No acute deformities or trauma, no asymmetry in tone, no peripheral edema, peripheral pulses palpated, no tenderness to palpation in the extremities  Data Reviewed: In the ED Temp 97.6, heart rate 64-68, respiratory rate 14-17, blood pressure 161/59-175/62 satting 97-98% No leukocytosis with a white blood cell count of 7.3, hemoglobin 15.0, platelets 152 Hyponatremic at 124, previously 137 UA is not indicative of UTI EKG shows a heart rate of 65, sinus rhythm, QTc 451 Admission requested for hyponatremia with generalized weakness  Assessment and Plan: * Acute hyponatremia -Na 127 -NS at admission, but dc'ed with improvement of hyponatremia -Continue home Na tabs -Check BMP at midnight and in the AM -Goal Na 130 -Hold desmopressin -Continue to monitor  Anxiety -Continue atarax  Generalized weakness -secondary to hyponatremia most likely -see plan for hyponatremia -consult PT eval and treat   Coronary artery disease involving native coronary artery of native heart with angina pectoris (HCC) -Continue asa, imdur, beta blocker, statin -Continue to monitor -No chest pain -given generalized weakness, check troponin   Hypothyroidism -Continue synthroid -check TSH given generalized weakness -Continue to monitor  HYPERCHOLESTEROLEMIA -Continue statin      Advance Care Planning:   Code Status: Full Code   Consults: None at this time  Family Communication: No family at bedside  Severity of Illness: The appropriate patient status for this patient is OBSERVATION. Observation status is judged to be reasonable and necessary in order to provide the required intensity of service to ensure the patient's safety. The patient's presenting symptoms, physical exam findings, and initial radiographic and laboratory data in the context of their medical condition is felt to place them at decreased risk for further clinical deterioration. Furthermore, it is  anticipated that the patient will be medically stable for discharge from the hospital within 2 midnights of admission.   Author: Lilyan Gilford, DO 01/22/2023 5:08 AM  For on call review [email or web address].

## 2023-01-22 NOTE — Assessment & Plan Note (Addendum)
-  Continue asa, imdur, beta blocker, statin -Continue to monitor -No chest pain -given generalized weakness, check troponin

## 2023-01-22 NOTE — Assessment & Plan Note (Signed)
-  Na 127 -NS at admission, but dc'ed with improvement of hyponatremia -Continue home Na tabs -Check BMP at midnight and in the AM -Goal Na 130 -Hold desmopressin -Continue to monitor

## 2023-01-22 NOTE — Progress Notes (Signed)
Pt taken to exit via wheelchair. Pt had all of is belongings and left in a private vehicle with his wife.

## 2023-01-22 NOTE — TOC Transition Note (Signed)
Transition of Care [facility]) - CM/SW Discharge Note   Patient Details  Name: [name] MRN: [id number] Date of Birth: [birth year]  [facility] of [facility]) CM/SW Contact:  Villa Herb, LCSWA Phone Number: 01/22/2023, 11:01 AM   Clinical Narrative:    Pt is recommended for OP PT services. CSW spoke with pt to review recommendation. Pt states that he has a lot of appointments coming up and that he follows with his PCP next week. Pt states that he will follow up with his PCP on PT needs and he does not want this CSW to set anything up at this time. TOC signing off.   Final next level of care: Home/Self Care Barriers to Discharge: No Barriers Identified   Patient Goals and CMS Choice CMS Medicare.gov Compare Post Acute Care list provided to:: Patient Choice offered to / list presented to : Patient  Discharge Placement                         Discharge Plan and Services Additional resources added to the After Visit Summary for                                       Social Determinants of Health (SDOH) Interventions SDOH Screenings   Food Insecurity: No Food Insecurity (01/21/2023)  Housing: Low Risk  (01/21/2023)  Transportation Needs: No Transportation Needs (01/21/2023)  Utilities: Not At Risk (01/21/2023)  Alcohol Screen: Low Risk  (01/21/2023)  Depression (PHQ2-9): High Risk (01/21/2023)  Financial Resource Strain: Low Risk  (01/21/2023)  Physical Activity: Inactive (01/21/2023)  Social Connections: Moderately Isolated (01/21/2023)  Stress: Stress Concern Present (01/21/2023)  Tobacco Use: Medium Risk (01/21/2023)  Health Literacy: Adequate Health Literacy (01/21/2023)     Readmission Risk Interventions     No data to display

## 2023-01-22 NOTE — Plan of Care (Signed)
  Problem: Acute Rehab PT Goals(only PT should resolve) Goal: Pt Will Go Sit To Supine/Side Outcome: Progressing Flowsheets (Taken 01/22/2023 0945) Pt will go Sit to Supine/Side: Independently Goal: Patient Will Transfer Sit To/From Stand Outcome: Progressing Flowsheets (Taken 01/22/2023 0945) Patient will transfer sit to/from stand: with supervision Goal: Pt Will Transfer Bed To Chair/Chair To Bed Outcome: Progressing Flowsheets (Taken 01/22/2023 0945) Pt will Transfer Bed to Chair/Chair to Bed: with supervision Goal: Pt Will Ambulate Outcome: Progressing Flowsheets (Taken 01/22/2023 0945) Pt will Ambulate:  100 feet  with supervision  with least restrictive assistive device

## 2023-01-22 NOTE — Care Management Obs Status (Signed)
MEDICARE OBSERVATION STATUS NOTIFICATION   Patient Details  Name: [name] MRN: [id number] Date of Birth: [birth year]   Medicare Observation Status Notification Given:  Yes    Villa Herb, LCSWA 01/22/2023, 11:05 AM

## 2023-01-24 ENCOUNTER — Ambulatory Visit (HOSPITAL_COMMUNITY)
Admission: RE | Admit: 2023-01-24 | Discharge: 2023-01-24 | Disposition: A | Discharge status: Home / Self Care | Payer: Medicare Other | Source: Ambulatory Visit | Attending: Otolaryngology | Admitting: Otolaryngology

## 2023-01-24 DIAGNOSIS — K219 Gastro-esophageal reflux disease without esophagitis: Secondary | ICD-10-CM | POA: Diagnosis not present

## 2023-01-24 DIAGNOSIS — K224 Dyskinesia of esophagus: Secondary | ICD-10-CM | POA: Diagnosis not present

## 2023-01-24 DIAGNOSIS — R1310 Dysphagia, unspecified: Secondary | ICD-10-CM | POA: Diagnosis not present

## 2023-01-24 DIAGNOSIS — K222 Esophageal obstruction: Secondary | ICD-10-CM | POA: Diagnosis not present

## 2023-01-25 ENCOUNTER — Other Ambulatory Visit (INDEPENDENT_AMBULATORY_CARE_PROVIDER_SITE_OTHER): Payer: Self-pay | Admitting: Physician Assistant

## 2023-01-25 DIAGNOSIS — R1310 Dysphagia, unspecified: Secondary | ICD-10-CM

## 2023-01-25 DIAGNOSIS — J383 Other diseases of vocal cords: Secondary | ICD-10-CM

## 2023-01-27 ENCOUNTER — Other Ambulatory Visit (HOSPITAL_COMMUNITY): Payer: Self-pay | Admitting: Occupational Therapy

## 2023-01-27 ENCOUNTER — Ambulatory Visit (INDEPENDENT_AMBULATORY_CARE_PROVIDER_SITE_OTHER): Payer: Medicare Other | Admitting: Family Medicine

## 2023-01-27 VITALS — BP 120/72 | HR 67 | Ht 67.0 in | Wt 138.2 lb

## 2023-01-27 DIAGNOSIS — E039 Hypothyroidism, unspecified: Secondary | ICD-10-CM

## 2023-01-27 DIAGNOSIS — D443 Neoplasm of uncertain behavior of pituitary gland: Secondary | ICD-10-CM | POA: Diagnosis not present

## 2023-01-27 DIAGNOSIS — E871 Hypo-osmolality and hyponatremia: Secondary | ICD-10-CM

## 2023-01-27 DIAGNOSIS — G4700 Insomnia, unspecified: Secondary | ICD-10-CM

## 2023-01-27 DIAGNOSIS — D692 Other nonthrombocytopenic purpura: Secondary | ICD-10-CM

## 2023-01-27 DIAGNOSIS — E232 Diabetes insipidus: Secondary | ICD-10-CM

## 2023-01-27 DIAGNOSIS — Z23 Encounter for immunization: Secondary | ICD-10-CM | POA: Diagnosis not present

## 2023-01-27 NOTE — Progress Notes (Signed)
Subjective:    Patient ID: [name], male    DOB: 1941/03/18, 82 y.o.   MRN: [id number]  HPI Patient is her for 6 month follow up. Patient would like to discuss physical therapy, patient states he was in the hospital last weekend and would like to know what they gave him for sleep and to help his sugar go down. (See PHQ)     01/27/2023   11:08 AM 01/21/2023    3:42 PM 07/28/2022    8:56 AM 01/19/2022   10:04 AM 11/05/2021   10:34 AM  Depression screen PHQ 2/9  Decreased Interest 2 2 2 1  0  Down, Depressed, Hopeless 2 2 2 1  0  PHQ - 2 Score 4 4 4 2  0  Altered sleeping 3 3 3 3    Tired, decreased energy 3 3 3 3    Change in appetite 2 2 3 3    Feeling bad or failure about yourself  2 2 2  0   Trouble concentrating 2 2 2  0   Moving slowly or fidgety/restless 2 0 2 2   Suicidal thoughts 0 0 0 0   PHQ-9 Score 18 16 19 13    Difficult doing work/chores Somewhat difficult Somewhat difficult Somewhat difficult Somewhat difficult    Patient to a degree finds himself frustrated by his recent health issues States he does not sleep well [facility] tried trazodone it really did not seem to help a whole lot He states when he was in the hospital they gave him some that really helped him sleep he wonders if he can be on that I explained to the patient that being on medications to help sleep at his age unfortunately opens up the door for accidental falls injuries and would not recommend it  He is have a low sodium He also states he was told he is diabetic he is not on any medicines but he states he is having low sugar spells that have occurred 2 or 3 different times where it dropped to the 50s and low 60s for no apparent reason when he checks his glucose He tries to eat regularly drink liquids He states he takes his medicine including DDAVP twice daily  He states his A1c's have been in the mid sixes before with the [place] but he does not want to go there anymore We do not have any recent  A1c's His sugars in the hospital actually look fairly good Review of Systems     Objective:   Physical Exam  General-in no acute distress Eyes-no discharge Lungs-respiratory rate normal, CTA CV-no murmurs,RRR Extremities skin warm dry no edema Neuro grossly normal Behavior normal, alert  Senile purpura noted on the arms Blood pressure maintains well sitting and standing     Assessment & Plan:  1. Needs flu shot Flu shot today - Flu Vaccine Trivalent High Dose (Fluad)  2. Insomnia, unspecified type I do not recommend any medications to help with sleep because of increased risk of falls  3. Diabetes insipidus [facility]) He states he is getting go see Dr. Talmage Nap this appointment is coming up within the next week he states he just had blood work drawn today We do not have the ability to see this blood work We do not have the ability to see the [facility] blood work either Patient was encouraged to make sure copy of lab work sent to [place] We will hold off on doing any lab work because it would just more than likely  be duplications Will send letter to Dr. Talmage Nap regarding making sure we get copy of the lab work  4. Hypothyroidism, unspecified type Continue current measures his thyroid test in the hospital look good  5. Hyponatremia Sodium by the time he left was good I do not feel we need to repeat it now given that he had blood work earlier today  6. Senile purpura (HCC) Bruising on the arms consistent with senile purpura  Follow up every 4 to 6 months He follows through the [place] for his moods should he desire to follow-up this way he can

## 2023-01-30 ENCOUNTER — Encounter: Payer: Self-pay | Admitting: Family Medicine

## 2023-01-30 NOTE — Progress Notes (Signed)
Please fax to Dr.Balan ASAP  [facility] patient has an appointment coming up the middle of this week

## 2023-02-04 ENCOUNTER — Encounter (HOSPITAL_COMMUNITY): Payer: Medicare Other

## 2023-02-04 ENCOUNTER — Other Ambulatory Visit (HOSPITAL_COMMUNITY): Payer: Self-pay | Admitting: Occupational Therapy

## 2023-02-04 DIAGNOSIS — R059 Cough, unspecified: Secondary | ICD-10-CM

## 2023-02-04 DIAGNOSIS — R1312 Dysphagia, oropharyngeal phase: Secondary | ICD-10-CM

## 2023-02-21 ENCOUNTER — Encounter (HOSPITAL_COMMUNITY): Payer: Self-pay | Admitting: Speech Pathology

## 2023-02-21 ENCOUNTER — Ambulatory Visit (HOSPITAL_COMMUNITY)
Admission: RE | Admit: 2023-02-21 | Discharge: 2023-02-21 | Disposition: A | Discharge status: Home / Self Care | Payer: Medicare Other | Source: Ambulatory Visit | Attending: Otolaryngology | Admitting: Otolaryngology

## 2023-02-21 ENCOUNTER — Ambulatory Visit (HOSPITAL_COMMUNITY): Payer: Medicare Other | Attending: Otolaryngology | Admitting: Speech Pathology

## 2023-02-21 DIAGNOSIS — R1312 Dysphagia, oropharyngeal phase: Secondary | ICD-10-CM | POA: Insufficient documentation

## 2023-02-21 DIAGNOSIS — R1310 Dysphagia, unspecified: Secondary | ICD-10-CM | POA: Diagnosis not present

## 2023-02-21 DIAGNOSIS — R059 Cough, unspecified: Secondary | ICD-10-CM | POA: Insufficient documentation

## 2023-02-21 NOTE — Therapy (Signed)
Modified Barium Swallow Study  Patient Details  Name: [name] MRN: [id number] Date of Birth: [birth year]  Today's Date: 02/21/2023  Modified Barium Swallow completed.  Full report located under Chart Review in the Imaging Section.  History of Present Illness [name] is an 82 yo male who was referred for MBSS by Dr. Ashok Croon (ENT) due to h/o prominent cricopharyngeus and Pt's current report of difficulty swallowing his saliva, solids, and liquids.   Clinical Impression Pt presents with mild oropharyngeal dysphagia characterized by swallow trigger at the level of the valleculae across consistencies, minimally reduced tongue base retraction, min posterior pharyngeal wall stripping, and prominent cricopharyngeus with resulting min vallecular residue and pyriform residue, which clears with repeat/dry swallow. While Pt does present with prominent CP, only trace amount of residuals noted in pyriforms, the pill passed through. Pt reports that he has had a "choking" episode with a Frito when he was eating while driving and with chicken yesterday. He reports that he is the last one to get get up from the table because he has to "chew forever" due to fear of choking. Pt did not exhibit penetration, aspiration, or significant residuals in pharynx, however over the course of a meal and/or when eating different textures (meats etc), Pt may potentially have greater difficulty. Pt can continue to have regular textures, however it is recommended that he chop his meats well and consider adding moisture to dry solids (avoid problematic foods). Pt is to see the ENT next week and this report will be faxed (imaging can be found in EPIC). No further SLP services indicated at this time. Factors that may increase risk of adverse event in presence of aspiration [name] & [name] 2021):    Swallow Evaluation Recommendations Recommendations: PO diet PO Diet Recommendation: Regular;Thin liquids (Level 0)  (cut up meats well) Liquid Administration via: Cup;Straw Medication Administration: Whole meds with liquid Supervision: Patient able to self-feed Swallowing strategies  : Slow rate;Small bites/sips;Multiple dry swallows after each bite/sip Postural changes: Position pt fully upright for meals;Stay upright 30-60 min after meals Oral care recommendations: Oral care BID (2x/day) Recommended consults:  (f/u with ENT)    Thank you,  [name], CCC-SLP [phone]   [name] 02/21/2023,2:13 PM

## 2023-02-28 ENCOUNTER — Encounter (INDEPENDENT_AMBULATORY_CARE_PROVIDER_SITE_OTHER): Payer: Self-pay | Admitting: Otolaryngology

## 2023-02-28 ENCOUNTER — Ambulatory Visit (INDEPENDENT_AMBULATORY_CARE_PROVIDER_SITE_OTHER): Payer: Medicare Other | Admitting: Otolaryngology

## 2023-02-28 VITALS — BP 159/68 | HR 67

## 2023-02-28 DIAGNOSIS — R1313 Dysphagia, pharyngeal phase: Secondary | ICD-10-CM

## 2023-02-28 DIAGNOSIS — K224 Dyskinesia of esophagus: Secondary | ICD-10-CM | POA: Diagnosis not present

## 2023-02-28 DIAGNOSIS — K219 Gastro-esophageal reflux disease without esophagitis: Secondary | ICD-10-CM

## 2023-02-28 DIAGNOSIS — G4700 Insomnia, unspecified: Secondary | ICD-10-CM | POA: Diagnosis not present

## 2023-02-28 DIAGNOSIS — J392 Other diseases of pharynx: Secondary | ICD-10-CM

## 2023-02-28 DIAGNOSIS — R1319 Other dysphagia: Secondary | ICD-10-CM

## 2023-02-28 DIAGNOSIS — J383 Other diseases of vocal cords: Secondary | ICD-10-CM

## 2023-02-28 DIAGNOSIS — R432 Parageusia: Secondary | ICD-10-CM

## 2023-02-28 DIAGNOSIS — R0981 Nasal congestion: Secondary | ICD-10-CM

## 2023-02-28 DIAGNOSIS — R634 Abnormal weight loss: Secondary | ICD-10-CM

## 2023-02-28 NOTE — Patient Instructions (Signed)
-   schedule a visit with Dr Marina Goodell GI  - return 6 weeks to discuss esophageal dilation

## 2023-02-28 NOTE — Progress Notes (Signed)
ENT progress note:  Update 02/28/2023: He returns for follow-up after MBS esophagram.  Continues to have similar dysphagia symptoms summarized below during his initial in office visit.  Reports significant insomnia recently, was given prescription for paroxetine but has not started it due to concern for side effects.  As noted below he was previously referred to [facility] by a local GI physician Dr. Marina Goodell.  He reports history of dilation performed by Dr. Marina Goodell in the past.  Initial evaluation 01/13/2023  Reason for Consult: dysphagia    HPI: [name] is an 82 y.o. male with history of prostate cancer, history of pituitary adenoma,  longstanding history of trouble swallowing, here for initial evaluation with me.  Previously seen by GI had upper endoscopy 2021, which showed tight CP bar per report.  He was referred to be seen at a tertiary care center and was seen at [facility] by another GI physician who recommended ENT consultation due to prominent CP bar.  He also had an esophagram several years ago (2021), and it showed prominent CP bar and " tiny left posterolateral diverticulum inferior to CP muscle"  He coughs with solids and liquids all the time, and it is hard for him to swallow pills.  He reports the symptoms for over 4 years. Even his own secretions can cause choking and coughing because it is hard for him to clear the secretions.  No strokes in the past, he does have history of CAD, but no MI, no cardiac stents.  He had antibiotics given for suspected PNA at the [place] 1 yr ago, no fevers at home, and he is not sure if the pneumonia he had 1 year ago was aspiration pneumonia. He reports he is losing weight and used to be 155 lb now he is down to 131 lb. This is due to poor sense of taste and lack of appetite. Had seen a nutritionist at the [place], and drinks Ensure to supplement calories. He does not drink Ensure every day. He eats cheerios mostly, and soft foods. Hx of pituitary adenoma surgery 2009 -  no issues - having MRI brain tomorrow.   Records Reviewed:  EGD report from [facility] - tight CP bar during EGD 2021 Sent to see GI at [facility] and told to see ENT for CP bar     Past Medical History:  Diagnosis Date   Allergy    Arthritis    CAD (coronary artery disease)    Nonobstructive CAD with the exception of a nondominant RCA that was managed medically in April 2020.   Colon polyps    Adenomatous   Constipation    COPD (chronic obstructive pulmonary disease) (HCC)    Diverticulosis    Essential hypertension    GERD (gastroesophageal reflux disease)    History of anxiety    History of depression    Hypercholesterolemia    Hypothyroidism    Pituitary microadenoma (HCC)    Status post resection 2009   Prostate cancer (HCC)    Senile purpura (HCC) 10/23/2018    Past Surgical History:  Procedure Laterality Date   BACK SURGERY  1991   CHOLECYSTECTOMY  08/2007   COLONOSCOPY     LEFT HEART CATH AND CORONARY ANGIOGRAPHY N/A 09/01/2018   Procedure: LEFT HEART CATH AND CORONARY ANGIOGRAPHY;  Surgeon: Lennette Bihari, MD;  Location: [facility] INVASIVE [facility];  Service: Cardiovascular;  Laterality: N/A;   POLYPECTOMY     PROSTATE SURGERY     Removal of pituitary  tumor  9/09   UPPER GASTROINTESTINAL ENDOSCOPY      Family History  Problem Relation Age of Onset   Stomach cancer Mother    Clotting disorder Father    Heart disease Father    Heart attack Father    Colon cancer Neg Hx    Diabetes Neg Hx    Colon polyps Neg Hx    Rectal cancer Neg Hx     Social History:  reports that he has quit smoking. His smoking use included cigarettes. He has never used smokeless tobacco. He reports that he does not currently use alcohol. He reports that he does not currently use drugs.  Allergies:  Allergies  Allergen Reactions   Codeine Nausea Only and Nausea And Vomiting   Losartan     Patient had elevated potassium and also slight elevation of creatinine-October 2017   Amlodipine  Nausea And Vomiting    Complained of neck pain with medication-November 2017 not allergic   Mirtazapine     Severe drowsiness   Morphine Swelling    Chest pain   Statins Other (See Comments)    Pt is handling statins well now   Tramadol Nausea And Vomiting    Sweat, light-headed    Medications: I have reviewed the patient's current medications.  The PMH, PSH, Medications, Allergies, and SH were reviewed and updated.  ROS: Constitutional: Negative for fever, weight loss and weight gain. Cardiovascular: Negative for chest pain and dyspnea on exertion. Respiratory: Is not experiencing shortness of breath at rest. Gastrointestinal: Negative for nausea and vomiting. Neurological: Negative for headaches. Psychiatric: The patient is not nervous/anxious  Blood pressure (!) 159/68, pulse 67, SpO2 97%.  PHYSICAL EXAM:  Exam: General: Well-developed, well-nourished Communication and Voice: Raspy with weak projection Respiratory Respiratory effort: Equal inspiration and expiration without stridor Cardiovascular Peripheral Vascular: Warm extremities with equal color/perfusion Eyes: No nystagmus with equal extraocular motion bilaterally Neuro/Psych/Balance: Patient oriented to person, place, and time; Appropriate mood and affect; Gait is intact with no imbalance; Cranial nerves I-XII are intact Head and Face Inspection: Normocephalic and atraumatic without mass or lesion Facial Strength: Facial motility symmetric and full bilaterally ENT Pinna: External ear intact and fully developed External canal: Canal is patent with intact skin Tympanic Membrane: Clear and mobile External Nose: No scar or anatomic deformity Neck Neck and Trachea: Midline trachea without mass or lesion  Procedure performed during his last office visit: Preoperative diagnosis: dysphagia   Postoperative diagnosis:   Same + GERD/LPR  Procedure: Flexible fiberoptic laryngoscopy  Surgeon: Ashok Croon,  MD  Anesthesia: Topical lidocaine and Afrin Complications: None Condition is stable throughout exam  Indications and consent:  The patient presents to the clinic with Indirect laryngoscopy view was incomplete. Thus it was recommended that they undergo a flexible fiberoptic laryngoscopy. All of the risks, benefits, and potential complications were reviewed with the patient preoperatively and verbal informed consent was obtained.  Procedure: The patient was seated upright in the clinic. Topical lidocaine and Afrin were applied to the nasal cavity. After adequate anesthesia had occurred, I then proceeded to pass the flexible telescope into the nasal cavity. The nasal cavity was patent without rhinorrhea or polyp. The nasopharynx was also patent without mass or lesion. The base of tongue was visualized and was normal. There were no signs of pooling of secretions in the piriform sinuses. The true vocal folds were mobile bilaterally. There were no signs of glottic or supraglottic mucosal lesion or mass. There was moderate interarytenoid pachydermia and  post cricoid edema. The telescope was then slowly withdrawn and the patient tolerated the procedure throughout.  Studies Reviewed: 03/03/20 esophagram CLINICAL DATA:  Cervical dysphagia question cricopharyngeal bar, difficulty swallowing pills, feels like liquids are coming back up, worsening symptoms for a month, on medication for GERD, history prostate cancer EXAM: ESOPHOGRAM / BARIUM SWALLOW / BARIUM TABLET STUDY   TECHNIQUE: Combined double contrast and single contrast examination performed using effervescent crystals, thick barium liquid, and thin barium liquid. The patient was observed with fluoroscopy swallowing a 13 mm barium sulphate tablet.   FLUOROSCOPY TIME:  Fluoroscopy Time:  2 minutes 0 seconds   Radiation Exposure Index (if provided by the fluoroscopic device): 37.3 mGy   Number of Acquired Spot Images: multiple fluoroscopic  screen captures   COMPARISON:  None   FINDINGS: Esophageal distention: Normal distention without mass or stricture   Filling defects:  None   12.5 mm barium tablet: Easily passed from oral cavity to stomach without obstruction   Motility:  Moderate diffuse age-related esophageal dysmotility   Mucosa:  Smooth without irregularity or ulceration   Hypopharynx/cervical esophagus: Prominent cricopharyngeus muscle, nonobstructing. Tiny LEFT posterolateral diverticulum is identified inferior to the cricopharyngeus. No laryngeal penetration or aspiration. Mild residuals in vallecula and piriform sinuses, cleared by a voluntary second swallow.   Hiatal hernia:  Absent   GE reflux:  Not witnessed during exam   Other:  N/A   IMPRESSION: Moderate esophageal dysmotility.   Prominent cricopharyngeus muscle with a tiny LEFT posterolateral diverticulum inferior to the cricopharyngeus muscle.   Mild vallecular and piriform sinus residuals without laryngeal penetration or aspiration.   No esophageal mass or stricture.  Assessment/Plan: Encounter Diagnoses  Name Primary?   Esophageal dysphagia Yes   Cricopharyngeal hypertrophy    Dysphagia, cricopharyngeal    Esophageal dysmotility    Gastroesophageal reflux disease without esophagitis    Loss of weight    Dysgeusia    Nasal congestion    Vocal fold atrophy      82 year old male with history of pituitary adenoma status post endoscopic resection 2009, history of COPD, hx of DI related to pituitary adenoma surgery, f/b Endocrinology, and low sodium levels in the past, who is here for evaluation of chronic long-standing dysphagia to solids and liquids.  Reports choking on both and also his own secretions.  No history of stroke.  Had esophagram done in 2021 which revealed prominent CP bar and tiny left posterolateral diverticulum below CP bar.  I reviewed esophagram imaging and there appears to be CP hypertrophy.  My exam today did  not reveal any focal neurologic deficits cranial nerves II through XII were intact, tongue and palate movement were intact and symmetric.  There was evidence of bilateral vocal fold movement, but he did have bilateral vocal fold atrophy and I suspect some degree of glottic insufficiency.  There was moderate postcricoid edema and pachydermia.  There was no pooling of secretions in hypopharynx or vallecula.  I did pass the scope past CP bar while he was swallowing water and UES appeared to open without issues and I did not visualize any strictures or diverticula.  Due to the fact that swallow studies are relatively outdated, and he never had modified barium swallow.  Will order repeat swallow evaluation.  He will return after testing to discuss results and consider any future interventions for his symptoms.  DDx includes oropharyngeal dysphagia, although his past esophagram also demonstrated moderate esophageal dysmotility which suggests element of esophageal dysphagia.  Will consider  management of CP hypertrophy once swallow test results are available for review.   - MBS/esophagram - RTC after swallow study   Update 02/28/2023 Chronic longstanding dysphagia and weight loss.  Prior diagnosis of CP hypertrophy.  MBS and esophagram results which I reviewed with the patient today demonstrate long segment of narrowing of the distal esophagus which per record review was not present on esophagram done in 2021.  He reports prior dilation procedure with Dr. Marina Goodell which I suspect was done at the upper esophageal sphincter level.  We discussed that CP dilation would be the easiest and least invasive option to try to see if that improves his symptoms.  I discussed with the patient that I would like him to make an appointment and see Dr. Marina Goodell to discuss distal esophageal narrowing seen on esophagram. -Will schedule for EGD and balloon dilation of CP segment.  He reports that last EGD was difficult due to significant  narrowing noted at the level of cervical esophagus when it was attempted.  Will plan for direct laryngoscopy prior to EGD for better exposure and direct visualization of balloon placement.  Will plan for infinity balloon trial. -Referral to Dr. Marina Goodell GI was sent (last office visit in 2021  2.  Chronic GERD -Continue Prilosec 20 mg daily -Diet and lifestyle changes to minimize reflux  3.  Insomnia weight loss.  Reports history of PTSD, followed at the [place]. -We discussed management options including melatonin and sleep hygiene -He will most likely hold off on paroxetine due to concerns of significant side effects  Thank you for allowing me to participate in the care of this patient. Please do not hesitate to contact me with any questions or concerns.   Ashok Croon, MD Otolaryngology [facility] Phone: [phone] Fax: [phone]    02/28/2023, 8:43 PM

## 2023-03-03 ENCOUNTER — Encounter: Payer: Self-pay | Admitting: Internal Medicine

## 2023-03-14 DIAGNOSIS — E032 Hypothyroidism due to medicaments and other exogenous substances: Secondary | ICD-10-CM | POA: Diagnosis not present

## 2023-03-14 DIAGNOSIS — E232 Diabetes insipidus: Secondary | ICD-10-CM | POA: Diagnosis not present

## 2023-03-14 DIAGNOSIS — D443 Neoplasm of uncertain behavior of pituitary gland: Secondary | ICD-10-CM | POA: Diagnosis not present

## 2023-03-15 ENCOUNTER — Ambulatory Visit (HOSPITAL_COMMUNITY): Payer: Medicare Other | Admitting: Speech Pathology

## 2023-04-12 DIAGNOSIS — D352 Benign neoplasm of pituitary gland: Secondary | ICD-10-CM | POA: Diagnosis not present

## 2023-04-15 ENCOUNTER — Encounter (INDEPENDENT_AMBULATORY_CARE_PROVIDER_SITE_OTHER): Payer: Self-pay | Admitting: Otolaryngology

## 2023-04-15 ENCOUNTER — Ambulatory Visit (INDEPENDENT_AMBULATORY_CARE_PROVIDER_SITE_OTHER): Payer: Medicare Other | Admitting: Otolaryngology

## 2023-04-15 VITALS — BP 159/67 | HR 61

## 2023-04-15 DIAGNOSIS — K224 Dyskinesia of esophagus: Secondary | ICD-10-CM | POA: Diagnosis not present

## 2023-04-15 DIAGNOSIS — R1310 Dysphagia, unspecified: Secondary | ICD-10-CM

## 2023-04-15 DIAGNOSIS — R1312 Dysphagia, oropharyngeal phase: Secondary | ICD-10-CM

## 2023-04-15 DIAGNOSIS — J383 Other diseases of vocal cords: Secondary | ICD-10-CM

## 2023-04-15 DIAGNOSIS — K219 Gastro-esophageal reflux disease without esophagitis: Secondary | ICD-10-CM | POA: Diagnosis not present

## 2023-04-15 DIAGNOSIS — R1319 Other dysphagia: Secondary | ICD-10-CM

## 2023-04-15 DIAGNOSIS — J392 Other diseases of pharynx: Secondary | ICD-10-CM

## 2023-04-15 NOTE — Patient Instructions (Addendum)
-   schedule swallow therapy  - Take Reflux Gourmet (natural supplement available on Amazon) to help with symptoms of chronic throat irritation

## 2023-04-15 NOTE — Progress Notes (Signed)
ENT progress note:  Update 04/15/23  Discussed the use of AI scribe software for clinical note transcription with the patient, who gave verbal consent to proceed.  Discussed the use of AI scribe software for clinical note transcription with the patient, who gave verbal consent to proceed.  History of Present Illness   The patient is an 82 yoM, with a longstanding history of dysphagia, reports  worsening of symptoms since the last consultation. He noticed that lately his trouble with swallowing is more pronounced when attempting to swallow pills, even when cut into quarters. The patient reports that the pill gets stuck in their throat and they have to wait for it to dissolve.  The patient has previously undergone a swallow study, which revealed a prominent CP and some esophageal stricture or narrowing at the distal esophagus. We discussed his care with his GI physician, Dr Marina Goodell, who advised that the patient has a hx of benign distal stricture and on his review of esophagram there was no irregularities to suggest cancer. No prior swallow therapy in the past. The patient also reports a history of reflux, for which they are taking omeprazole.  The patient's symptoms are not accompanied by weight loss or other signs of severe dysphagia. However, they express concern about the potential for worsening of their swallow function. The patient also mentions a history of operations that did not yield the expected results, leading to a cautious approach towards surgical interventions.    Update 02/28/2023: He returns for follow-up after MBS esophagram.  Continues to have similar dysphagia symptoms summarized below during his initial in office visit.  Reports significant insomnia recently, was given prescription for paroxetine but has not started it due to concern for side effects.  As noted below he was previously referred to [facility] by a local GI physician Dr. Marina Goodell.  He reports history of dilation performed by  Dr. Marina Goodell in the past.  Initial evaluation 01/13/2023  Reason for Consult: dysphagia    HPI: [name] is an 82 y.o. male with history of prostate cancer, history of pituitary adenoma,  longstanding history of trouble swallowing, here for initial evaluation with me.  Previously seen by GI had upper endoscopy 2021, which showed tight CP bar per report.  He was referred to be seen at a tertiary care center and was seen at [facility] by another GI physician who recommended ENT consultation due to prominent CP bar.  He also had an esophagram several years ago (2021), and it showed prominent CP bar and " tiny left posterolateral diverticulum inferior to CP muscle"  He coughs with solids and liquids all the time, and it is hard for him to swallow pills.  He reports the symptoms for over 4 years. Even his own secretions can cause choking and coughing because it is hard for him to clear the secretions.  No strokes in the past, he does have history of CAD, but no MI, no cardiac stents.  He had antibiotics given for suspected PNA at the [place] 1 yr ago, no fevers at home, and he is not sure if the pneumonia he had 1 year ago was aspiration pneumonia. He reports he is losing weight and used to be 155 lb now he is down to 131 lb. This is due to poor sense of taste and lack of appetite. Had seen a nutritionist at the [place], and drinks Ensure to supplement calories. He does not drink Ensure every day. He eats cheerios mostly, and soft foods.  Hx of pituitary adenoma surgery 2009 - no issues - having MRI brain tomorrow.   Records Reviewed:  EGD report from [facility] - tight CP bar during EGD 2021 Sent to see GI at [facility] and told to see ENT for CP bar     Past Medical History:  Diagnosis Date   Allergy    Arthritis    CAD (coronary artery disease)    Nonobstructive CAD with the exception of a nondominant RCA that was managed medically in April 2020.   Colon polyps    Adenomatous   Constipation    COPD (chronic  obstructive pulmonary disease) (HCC)    Diverticulosis    Essential hypertension    GERD (gastroesophageal reflux disease)    History of anxiety    History of depression    Hypercholesterolemia    Hypothyroidism    Pituitary microadenoma (HCC)    Status post resection 2009   Prostate cancer (HCC)    Senile purpura (HCC) 10/23/2018    Past Surgical History:  Procedure Laterality Date   BACK SURGERY  1991   CHOLECYSTECTOMY  08/2007   COLONOSCOPY     LEFT HEART CATH AND CORONARY ANGIOGRAPHY N/A 09/01/2018   Procedure: LEFT HEART CATH AND CORONARY ANGIOGRAPHY;  Surgeon: Lennette Bihari, MD;  Location: [facility]AS[facility];  Service: Cardiovascular;  Laterality: N/A;   POLYPECTOMY     PROSTATE SURGERY     Removal of pituitary tumor  9/09   UPPER GASTROINTESTINAL ENDOSCOPY      Family History  Problem Relation Age of Onset   Stomach cancer Mother    Clotting disorder Father    Heart disease Father    Heart attack Father    Colon cancer Neg Hx    Diabetes Neg Hx    Colon polyps Neg Hx    Rectal cancer Neg Hx     Social History:  reports that he has quit smoking. His smoking use included cigarettes. He has never used smokeless tobacco. He reports that he does not currently use alcohol. He reports that he does not currently use drugs.  Allergies:  Allergies  Allergen Reactions   Codeine Nausea Only and Nausea And Vomiting   Hydralazine Other (See Comments)   Losartan     Patient had elevated potassium and also slight elevation of creatinine-October 2017   Amlodipine Nausea And Vomiting    Complained of neck pain with medication-November 2017 not allergic   Mirtazapine     Severe drowsiness   Morphine Swelling    Chest pain   Statins Other (See Comments)    Pt is handling statins well now   Tramadol Nausea And Vomiting    Sweat, light-headed    Medications: I have reviewed the patient's current medications.  The PMH, PSH, Medications, Allergies, and SH were reviewed  and updated.  ROS: Constitutional: Negative for fever, weight loss and weight gain. Cardiovascular: Negative for chest pain and dyspnea on exertion. Respiratory: Is not experiencing shortness of breath at rest. Gastrointestinal: Negative for nausea and vomiting. Neurological: Negative for headaches. Psychiatric: The patient is not nervous/anxious  Blood pressure (!) 159/67, pulse 61, SpO2 97%.  PHYSICAL EXAM:  Exam: General: Well-developed, well-nourished Communication and Voice: Raspy with weak projection Respiratory Respiratory effort: Equal inspiration and expiration without stridor Cardiovascular Peripheral Vascular: Warm extremities with equal color/perfusion Eyes: No nystagmus with equal extraocular motion bilaterally Neuro/Psych/Balance: Patient oriented to person, place, and time; Appropriate mood and affect; Gait is intact with no imbalance;  Cranial nerves I-XII are intact Head and Face Inspection: Normocephalic and atraumatic without mass or lesion Facial Strength: Facial motility symmetric and full bilaterally ENT Pinna: External ear intact and fully developed External canal: Canal is patent with intact skin Tympanic Membrane: Clear and mobile External Nose: No scar or anatomic deformity Neck Neck and Trachea: Midline trachea without mass or lesion  Procedure performed during his initial office visit: Preoperative diagnosis: dysphagia   Postoperative diagnosis:   Same + GERD/LPR  Procedure: Flexible fiberoptic laryngoscopy  Surgeon: Ashok Croon, MD  Anesthesia: Topical lidocaine and Afrin Complications: None Condition is stable throughout exam  Indications and consent:  The patient presents to the clinic with Indirect laryngoscopy view was incomplete. Thus it was recommended that they undergo a flexible fiberoptic laryngoscopy. All of the risks, benefits, and potential complications were reviewed with the patient preoperatively and verbal informed  consent was obtained.  Procedure: The patient was seated upright in the clinic. Topical lidocaine and Afrin were applied to the nasal cavity. After adequate anesthesia had occurred, I then proceeded to pass the flexible telescope into the nasal cavity. The nasal cavity was patent without rhinorrhea or polyp. The nasopharynx was also patent without mass or lesion. The base of tongue was visualized and was normal. There were no signs of pooling of secretions in the piriform sinuses. The true vocal folds were mobile bilaterally. There were no signs of glottic or supraglottic mucosal lesion or mass. There was moderate interarytenoid pachydermia and post cricoid edema. The telescope was then slowly withdrawn and the patient tolerated the procedure throughout.  Studies Reviewed: 03/03/20 esophagram CLINICAL DATA:  Cervical dysphagia question cricopharyngeal bar, difficulty swallowing pills, feels like liquids are coming back up, worsening symptoms for a month, on medication for GERD, history prostate cancer EXAM: ESOPHOGRAM / BARIUM SWALLOW / BARIUM TABLET STUDY   TECHNIQUE: Combined double contrast and single contrast examination performed using effervescent crystals, thick barium liquid, and thin barium liquid. The patient was observed with fluoroscopy swallowing a 13 mm barium sulphate tablet.   FLUOROSCOPY TIME:  Fluoroscopy Time:  2 minutes 0 seconds   Radiation Exposure Index (if provided by the fluoroscopic device): 37.3 mGy   Number of Acquired Spot Images: multiple fluoroscopic screen captures   COMPARISON:  None   FINDINGS: Esophageal distention: Normal distention without mass or stricture   Filling defects:  None   12.5 mm barium tablet: Easily passed from oral cavity to stomach without obstruction   Motility:  Moderate diffuse age-related esophageal dysmotility   Mucosa:  Smooth without irregularity or ulceration   Hypopharynx/cervical esophagus: Prominent  cricopharyngeus muscle, nonobstructing. Tiny LEFT posterolateral diverticulum is identified inferior to the cricopharyngeus. No laryngeal penetration or aspiration. Mild residuals in vallecula and piriform sinuses, cleared by a voluntary second swallow.   Hiatal hernia:  Absent   GE reflux:  Not witnessed during exam   Other:  N/A   IMPRESSION: Moderate esophageal dysmotility.   Prominent cricopharyngeus muscle with a tiny LEFT posterolateral diverticulum inferior to the cricopharyngeus muscle.   Mild vallecular and piriform sinus residuals without laryngeal penetration or aspiration.   No esophageal mass or stricture.  HPI: [name] is an 82 yo male who was referred for MBSS by Dr. Ashok Croon (ENT) due to h/o prominent cricopharyngeus and Pt's current report of difficulty swallowing his saliva, solids, and liquids.   Per Dr. Irene Pap: [name] is an 82 y.o. male with history of prostate cancer, history of pituitary adenoma,  longstanding  history of trouble swallowing, here for initial evaluation with me.  Previously seen by GI had upper endoscopy 2021, which showed tight CP bar per report.  He was referred to be seen at a tertiary care center and was seen at [facility] by another GI physician who recommended ENT consultation due to prominent CP bar.  He also had an esophagram several years ago (2021), and it showed prominent CP bar and " tiny left posterolateral diverticulum inferior to CP muscle"   Pt had BaSw 01/24/2023: IMPRESSION: 1. Mild long segment fixed narrowing of the distal esophagus. 2. Moderate esophageal dysmotility. 3. Mild gastroesophageal reflux.   Clinical Impression: Clinical Impression: Pt presents with mild oropharyngeal dysphagia characterized by swallow trigger at the level of the valleculae across consistencies, minimally reduced tongue base retraction, min posterior pharyngeal wall stripping, and prominent cricopharyngeus with resulting min  vallecular residue and pyriform residue, which clears with repeat/dry swallow. While Pt does present with prominent CP, only trace amount of residuals noted in pyriforms, the pill passed through. Pt reports that he has had a "choking" episode with a Frito when he was eating while driving and with chicken yesterday. He reports that he is the last one to get get up from the table because he has to "chew forever" due to fear of choking. Pt did not exhibit penetration, aspiration, or significant residuals in pharynx, however over the course of a meal and/or when eating different textures (meats etc), Pt may potentially have greater difficulty. Pt can continue to have regular textures, however it is recommended that he chop his meats well and consider adding moisture to dry solids (avoid problematic foods). Pt is to see the ENT next week and this report will be faxed (imaging can be found in EPIC). No further SLP services indicated at this time.    Factors that may increase risk of adverse event in presence of aspiration Rubye Oaks & Clearance Coots 2021): No data recorded   Recommendations/Plan: Swallowing Evaluation Recommendations Swallowing Evaluation Recommendations Recommendations: PO diet PO Diet Recommendation: Regular; Thin liquids (Level 0) (cut up meats well) Liquid Administration via: Cup; Straw Medication Administration: Whole meds with liquid Supervision: Patient able to self-feed  Assessment/Plan: Encounter Diagnoses  Name Primary?   Cricopharyngeal hypertrophy Yes   Esophageal dysmotility    Oropharyngeal dysphagia    Esophageal dysphagia    Gastroesophageal reflux disease without esophagitis    Vocal fold atrophy       82 year old male with history of pituitary adenoma status post endoscopic resection 2009, history of COPD, hx of DI related to pituitary adenoma surgery, f/b Endocrinology, and low sodium levels in the past, who is here for evaluation of chronic long-standing dysphagia to  solids and liquids.  Reports choking on both and also his own secretions.  No history of stroke.  Had esophagram done in 2021 which revealed prominent CP bar and tiny left posterolateral diverticulum below CP bar.  I reviewed esophagram imaging and there appears to be CP hypertrophy.  My exam today did not reveal any focal neurologic deficits cranial nerves II through XII were intact, tongue and palate movement were intact and symmetric.  There was evidence of bilateral vocal fold movement, but he did have bilateral vocal fold atrophy and I suspect some degree of glottic insufficiency.  There was moderate postcricoid edema and pachydermia.  There was no pooling of secretions in hypopharynx or vallecula.  I did pass the scope past CP bar while he was swallowing water and UES appeared to open  without issues and I did not visualize any strictures or diverticula.  Due to the fact that swallow studies are relatively outdated, and he never had modified barium swallow.  Will order repeat swallow evaluation.  He will return after testing to discuss results and consider any future interventions for his symptoms.  DDx includes oropharyngeal dysphagia, although his past esophagram also demonstrated moderate esophageal dysmotility which suggests element of esophageal dysphagia.  Will consider management of CP hypertrophy once swallow test results are available for review.   - MBS/esophagram - RTC after swallow study   Update 02/28/2023 Chronic longstanding dysphagia and weight loss.  Prior diagnosis of CP hypertrophy.  MBS and esophagram results which I reviewed with the patient today demonstrate long segment of narrowing of the distal esophagus which per record review was not present on esophagram done in 2021.  He reports prior dilation procedure with Dr. Marina Goodell which I suspect was done at the upper esophageal sphincter level.  We discussed that CP dilation would be the easiest and least invasive option to try to see if  that improves his symptoms.  I discussed with the patient that I would like him to make an appointment and see Dr. Marina Goodell to discuss distal esophageal narrowing seen on esophagram. -Will schedule for EGD and balloon dilation of CP segment.  He reports that last EGD was difficult due to significant narrowing noted at the level of cervical esophagus when it was attempted.  Will plan for direct laryngoscopy prior to EGD for better exposure and direct visualization of balloon placement.  Will plan for infinity balloon trial. -Referral to Dr. Marina Goodell GI was sent (last office visit in 2021  2.  Chronic GERD -Continue Prilosec 20 mg daily -Diet and lifestyle changes to minimize reflux  3.  Insomnia weight loss.  Reports history of PTSD, followed at the [place]. -We discussed management options including melatonin and sleep hygiene -He will most likely hold off on paroxetine due to concerns of significant side effects  Update 04/15/23  On regular diet with stable weight currently, struggles with some foods and pills mostly.   Chronic longstanding dysphagia.  Prior diagnosis of CP hypertrophy.  MBS and esophagram results which I reviewed with the patient demonstrate long segment of narrowing of the distal esophagus which per record review was not present on esophagram done in 2021.  He reports prior dilation procedure with Dr. Marina Goodell which I suspect was done at the upper esophageal sphincter level.  We discussed that CP dilation would be the easiest and least invasive option to try to see if that improves his symptoms. I discussed his case with GI Dr Marina Goodell and per Dr Marina Goodell, the patient is known to have a benign distal esophageal stricture. I suspect this represents benign stricturing, in part. He felt there was no findings on esophagram to suggest cancer, and was not planning any interventions  - we discussed a trial of swallow therapy vs EGD and balloon dilation at the CP/UES/PES level, discussed risks and benefits,  outcomes and expectations and he would like to hold off on proceeding with the surgery at this time - will refer to swallow therapy   Chronic GERD -Continue Prilosec 20 mg daily -Diet and lifestyle changes to minimize reflux - trial of reflux gourmet   RTC in 6 months, and if no improvement or worsening of sx, will consider EGD and balloon dilation    Ashok Croon, MD Otolaryngology [facility] Phone: [phone] Fax: [phone]  04/15/2023, 4:32 PM

## 2023-04-18 ENCOUNTER — Telehealth (INDEPENDENT_AMBULATORY_CARE_PROVIDER_SITE_OTHER): Payer: Self-pay | Admitting: Otolaryngology

## 2023-04-18 NOTE — Telephone Encounter (Signed)
Patient called back and said he would like the speech therapy that Dr Irene Pap put in to go to [facility] [place] instead [address] [place], [place] [address]

## 2023-04-20 ENCOUNTER — Ambulatory Visit (HOSPITAL_COMMUNITY): Payer: Medicare Other | Attending: Otolaryngology | Admitting: Speech Pathology

## 2023-04-20 ENCOUNTER — Encounter (HOSPITAL_COMMUNITY): Payer: Self-pay | Admitting: Speech Pathology

## 2023-04-20 ENCOUNTER — Telehealth (INDEPENDENT_AMBULATORY_CARE_PROVIDER_SITE_OTHER): Payer: Self-pay | Admitting: Otolaryngology

## 2023-04-20 DIAGNOSIS — K224 Dyskinesia of esophagus: Secondary | ICD-10-CM | POA: Diagnosis not present

## 2023-04-20 DIAGNOSIS — R1312 Dysphagia, oropharyngeal phase: Secondary | ICD-10-CM | POA: Diagnosis not present

## 2023-04-20 DIAGNOSIS — J392 Other diseases of pharynx: Secondary | ICD-10-CM | POA: Diagnosis not present

## 2023-04-20 NOTE — Therapy (Incomplete)
OUTPATIENT SPEECH LANGUAGE PATHOLOGY SWALLOW EVALUATION   Patient Name: [name] MRN: [id number] DOB:November 19, 1940, 82 y.o., male Today's Date: 04/20/2023  PCP: Babs Sciara, MD REFERRING PROVIDER: Ashok Croon, MD  END OF SESSION:  End of Session - 04/20/23 1208     Visit Number 1    Number of Visits 4    Date for SLP Re-Evaluation 05/26/23    Authorization Type Medicare    SLP Start Time 1015    SLP Stop Time  1100    SLP Time Calculation (min) 45 min    Activity Tolerance Patient tolerated treatment well             Past Medical History:  Diagnosis Date   Allergy    Arthritis    CAD (coronary artery disease)    Nonobstructive CAD with the exception of a nondominant RCA that was managed medically in April 2020.   Colon polyps    Adenomatous   Constipation    COPD (chronic obstructive pulmonary disease) (HCC)    Diverticulosis    Essential hypertension    GERD (gastroesophageal reflux disease)    History of anxiety    History of depression    Hypercholesterolemia    Hypothyroidism    Pituitary microadenoma (HCC)    Status post resection 2009   Prostate cancer (HCC)    Senile purpura (HCC) 10/23/2018   Past Surgical History:  Procedure Laterality Date   BACK SURGERY  1991   CHOLECYSTECTOMY  08/2007   COLONOSCOPY     LEFT HEART CATH AND CORONARY ANGIOGRAPHY N/A 09/01/2018   Procedure: LEFT HEART CATH AND CORONARY ANGIOGRAPHY;  Surgeon: Lennette Bihari, MD;  Location: [facility];  Service: Cardiovascular;  Laterality: N/A;   POLYPECTOMY     PROSTATE SURGERY     Removal of pituitary tumor  9/09   UPPER GASTROINTESTINAL ENDOSCOPY     Patient Active Problem List   Diagnosis Date Noted   Acute hyponatremia 01/21/2023   Generalized weakness 01/21/2023   Anxiety 01/21/2023   Diabetes insipidus (HCC) 07/30/2020   Coronary artery disease involving native coronary artery of native heart with angina pectoris (HCC) 07/30/2020   Prediabetes  02/16/2020   Peripheral arterial disease (HCC) 09/11/2019   COPD GOLD II  03/14/2019   DOE (dyspnea on exertion) 03/14/2019   Senile purpura (HCC) 10/23/2018   Chest pain 09/29/2018   Unstable angina (HCC)    Hyponatremia 09/06/2016   History of prostate cancer 12/17/2013   Leg weakness, bilateral 09/17/2013   Stiffness of joints, not elsewhere classified, multiple sites 09/17/2013   Radicular leg pain 09/17/2013   Loss of weight 11/27/2012   Hypothyroidism 11/27/2012   Constipation 07/01/2008   RECTAL BLEEDING 07/01/2008   History of colonic polyps 07/01/2008   PITUITARY MACROADENOMA 06/27/2008   HYPERCHOLESTEROLEMIA 06/27/2008   Essential hypertension 06/27/2008   GERD 06/27/2008   COLONIC POLYPS 07/03/2003   DIVERTICULOSIS, COLON 07/03/2003    ONSET DATE: 02/21/2023   REFERRING DIAG: J39.2 (ICD-10-CM) - Cricopharyngeal hypertrophy R13.13 (ICD-10-CM) - Dysphagia, cricopharyngeal K22.4 (ICD-10-CM) - Esophageal dysmotility R13.12 (ICD-10-CM) - Oropharyngeal dysphagia  THERAPY DIAG:  Dysphagia, oropharyngeal phase  Rationale for Evaluation and Treatment: Rehabilitation  SUBJECTIVE:   SUBJECTIVE STATEMENT: "I wake up at night coughing." "Swallow feels effortful"  Pt accompanied by: self  PERTINENT HISTORY:  Per Dr. Irene Pap (ENT): [name] is an 82 y.o. male with history of prostate cancer, history of pituitary adenoma,  longstanding history of trouble swallowing, here  for initial evaluation with me.  Previously seen by GI had upper endoscopy 2021, which showed tight CP bar per report.  He was referred to be seen at a tertiary care center and was seen at [facility] by another GI physician who recommended ENT consultation due to prominent CP bar.  He also had an esophagram several years ago (2021), and it showed prominent CP bar and " tiny left posterolateral diverticulum inferior to CP muscle". The patient, reports  worsening of symptoms since the last consultation. He noticed  that lately his trouble with swallowing is more pronounced when attempting to swallow pills, even when cut into quarters. The patient reports that the pill gets stuck in their throat and they have to wait for it to dissolve. Scope revealed: There was evidence of bilateral vocal fold movement, but he did have bilateral vocal fold atrophy and I suspect some degree of glottic insufficiency. There was moderate postcricoid edema and pachydermia. There was no pooling of secretions in hypopharynx or vallecula. I did pass the scope past CP bar while he was swallowing water and UES appeared to open without issues and I did not visualize any strictures or diverticula. We discussed a trial of swallow therapy vs EGD and balloon dilation at the CP/UES/PES level, discussed risks and benefits, outcomes and expectations and he would like to hold off on proceeding with the surgery at this time - will refer to swallow therapy    Chronic GERD -Continue Prilosec 20 mg daily -Diet and lifestyle changes to minimize reflux - trial of reflux gourmet    RTC in 6 months, and if no improvement or worsening of sx, will consider EGD and balloon dilation   PAIN:  Are you having pain? No  FALLS: Has patient fallen in last 6 months?  No  LIVING ENVIRONMENT: Lives with: lives with their spouse Lives in: House/apartment  PLOF:  Level of assistance: Independent with ADLs, Independent with IADLs Employment: Retired  PATIENT GOALS: Improve swallow function  OBJECTIVE:  Note: Objective measures were completed at Evaluation unless otherwise noted. OBJECTIVE:   DIAGNOSTIC FINDINGS:   Pt had BaSw 01/24/2023: IMPRESSION: 1. Mild long segment fixed narrowing of the distal esophagus. 2. Moderate esophageal dysmotility. 3. Mild gastroesophageal reflux.  INSTRUMENTAL SWALLOW STUDY FINDINGS (MBSS) 02/21/2023   Clinical Impression: Clinical Impression: Pt presents with mild oropharyngeal dysphagia characterized by swallow  trigger at the level of the valleculae across consistencies, minimally reduced tongue base retraction, min posterior pharyngeal wall stripping, and prominent cricopharyngeus with resulting min vallecular residue and pyriform residue, which clears with repeat/dry swallow. While Pt does present with prominent CP, only trace amount of residuals noted in pyriforms, the pill passed through. Pt reports that he has had a "choking" episode with a Frito when he was eating while driving and with chicken yesterday. He reports that he is the last one to get get up from the table because he has to "chew forever" due to fear of choking. Pt did not exhibit penetration, aspiration, or significant residuals in pharynx, however over the course of a meal and/or when eating different textures (meats etc), Pt may potentially have greater difficulty. Pt can continue to have regular textures, however it is recommended that he chop his meats well and consider adding moisture to dry solids (avoid problematic foods). Pt is to see the ENT next week and this report will be faxed (imaging can be found in EPIC). No further SLP services indicated at this time.  COGNITION: Overall cognitive status:  Within functional limits for tasks assessed Areas of impairment:  N/A  SUBJECTIVE DYSPHAGIA REPORTS:  Date of onset: *** Reported symptoms: {dysphagia symptoms:29766}  Current diet: regular and thin liquids  Co-morbid voice changes: No  FACTORS WHICH MAY INCREASE RISK OF ADVERSE EVENT IN PRESENCE OF ASPIRATION:  General health: well appearing  Risk factors: {CSE risk factors:29763}    ORAL MOTOR EXAMINATION: Overall status: WFL Comments: ***  CLINICAL SWALLOW ASSESSMENT:   Dentition: adequate natural dentition Vocal quality at baseline: normal Patient directly observed with POs: Yes: thin liquids  Feeding: able to feed self Liquids provided by: cup [facility] Swallow Protocol: Pass Oral phase signs and symptoms:  N/A Pharyngeal  phase signs and symptoms: complaints of globus  TODAY'S TREATMENT:                                                                                                                                         DATE: 04/20/2023  PATIENT EDUCATION: Education details: *** Person educated: {Person educated:25204} Education method: {Education Method:25205} Education comprehension: {Education Comprehension:25206}   ASSESSMENT:  CLINICAL IMPRESSION: Patient is an 82 y.o. male who was seen today for a clinical swallow evaluation and follow up from MBSS completed 02/2023.   OBJECTIVE IMPAIRMENTS: include dysphagia. These impairments are limiting patient from safety when swallowing. Factors affecting potential to achieve goals and functional outcome are co-morbidities and severity of impairments. Patient will benefit from skilled SLP services to address above impairments and improve overall function.  REHAB POTENTIAL: Good   GOALS: Goals reviewed with patient? Yes  SHORT TERM GOALS: Target date: 05/30/2023  *** Baseline: Goal status: INITIAL  2.  *** Baseline:  Goal status: INITIAL  3.  *** Baseline:  Goal status: INITIAL  LONG TERM GOALS: Same as short term goals  PLAN:  SLP FREQUENCY: 1x/week  SLP DURATION: 4 weeks  PLANNED INTERVENTIONS: 92526 Treatment of swallowing function, Re-evaluation, Aspiration precaution training, Pharyngeal strengthening exercises, Diet toleration management , SLP instruction and feedback, and Compensatory strategies   Thank you,  Havery Moros, CCC-SLP [phone]  Dorrine Montone, CCC-SLP 04/20/2023, 12:09 PM

## 2023-04-20 NOTE — Telephone Encounter (Signed)
Patient has some questions for [facility] before surgery on 12/13.

## 2023-04-22 ENCOUNTER — Ambulatory Visit: Admit: 2023-04-22 | Payer: Medicare Other

## 2023-04-22 SURGERY — LARYNGOSCOPY, DIRECT
Anesthesia: Choice

## 2023-05-09 ENCOUNTER — Encounter (INDEPENDENT_AMBULATORY_CARE_PROVIDER_SITE_OTHER): Payer: Medicare Other | Admitting: Otolaryngology

## 2023-05-12 ENCOUNTER — Encounter (HOSPITAL_COMMUNITY): Payer: Self-pay | Admitting: Speech Pathology

## 2023-05-12 ENCOUNTER — Ambulatory Visit (HOSPITAL_COMMUNITY): Payer: Medicare Other | Attending: Otolaryngology | Admitting: Speech Pathology

## 2023-05-12 DIAGNOSIS — R1312 Dysphagia, oropharyngeal phase: Secondary | ICD-10-CM | POA: Insufficient documentation

## 2023-05-12 NOTE — Therapy (Signed)
 OUTPATIENT SPEECH LANGUAGE PATHOLOGY SWALLOW TREATMENT   Patient Name: [name] MRN: [id number] DOB:[birth year], 83 y.o., male Today's Date: 05/12/2023  PCP: [name], MD REFERRING PROVIDER: Okey Burns, MD  END OF SESSION:  End of Session - 05/12/23 0906     Visit Number 2    Number of Visits 4    Date for SLP Re-Evaluation 05/26/23    Authorization Type Medicare    SLP Start Time [phone]    SLP Stop Time  0930    SLP Time Calculation (min) 38 min    Activity Tolerance Patient tolerated treatment well             Past Medical History:  Diagnosis Date   Allergy    Arthritis    CAD (coronary artery disease)    Nonobstructive CAD with the exception of a nondominant RCA that was managed medically in April 2020.   Colon polyps    Adenomatous   Constipation    COPD (chronic obstructive pulmonary disease) (HCC)    Diverticulosis    Essential hypertension    GERD (gastroesophageal reflux disease)    History of anxiety    History of depression    Hypercholesterolemia    Hypothyroidism    Pituitary microadenoma (HCC)    Status post resection 2009   Prostate cancer (HCC)    Senile purpura (HCC) 10/23/2018   Past Surgical History:  Procedure Laterality Date   BACK SURGERY  1991   CHOLECYSTECTOMY  08/2007   COLONOSCOPY     LEFT HEART CATH AND CORONARY ANGIOGRAPHY N/A 09/01/2018   Procedure: LEFT HEART CATH AND CORONARY ANGIOGRAPHY;  Surgeon: Burnard Debby LABOR, MD;  Location: [facility];  Service: Cardiovascular;  Laterality: N/A;   POLYPECTOMY     PROSTATE SURGERY     Removal of pituitary tumor  9/09   UPPER GASTROINTESTINAL ENDOSCOPY     Patient Active Problem List   Diagnosis Date Noted   Acute hyponatremia 01/21/2023   Generalized weakness 01/21/2023   Anxiety 01/21/2023   Diabetes insipidus (HCC) 07/30/2020   Coronary artery disease involving native coronary artery of native heart with angina pectoris (HCC) 07/30/2020   Prediabetes  02/16/2020   Peripheral arterial disease (HCC) 09/11/2019   COPD GOLD II  03/14/2019   DOE (dyspnea on exertion) 03/14/2019   Senile purpura (HCC) 10/23/2018   Chest pain 09/29/2018   Unstable angina (HCC)    Hyponatremia 09/06/2016   History of prostate cancer 12/17/2013   Leg weakness, bilateral 09/17/2013   Stiffness of joints, not elsewhere classified, multiple sites 09/17/2013   Radicular leg pain 09/17/2013   Loss of weight 11/27/2012   Hypothyroidism 11/27/2012   Constipation 07/01/2008   RECTAL BLEEDING 07/01/2008   History of colonic polyps 07/01/2008   PITUITARY MACROADENOMA 06/27/2008   HYPERCHOLESTEROLEMIA 06/27/2008   Essential hypertension 06/27/2008   GERD 06/27/2008   COLONIC POLYPS 07/03/2003   DIVERTICULOSIS, COLON 07/03/2003    ONSET DATE: 02/21/2023   REFERRING DIAG: J39.2 (ICD-10-CM) - Cricopharyngeal hypertrophy R13.13 (ICD-10-CM) - Dysphagia, cricopharyngeal K22.4 (ICD-10-CM) - Esophageal dysmotility R13.12 (ICD-10-CM) - Oropharyngeal dysphagia  THERAPY DIAG:  Dysphagia, oropharyngeal phase  Rationale for Evaluation and Treatment: Rehabilitation  SUBJECTIVE:   SUBJECTIVE STATEMENT: I get choked when I am eating Cheerios.  Pt accompanied by: self  PERTINENT HISTORY:  Per Dr. Okey (ENT): MUADH CREASY is an 83 y.o. male with history of prostate cancer, history of pituitary adenoma,  longstanding history of trouble swallowing, here for  initial evaluation with me.  Previously seen by GI had upper endoscopy 2021, which showed tight CP bar per report.  He was referred to be seen at a tertiary care center and was seen at [facility] by another GI physician who recommended ENT consultation due to prominent CP bar.  He also had an esophagram several years ago (2021), and it showed prominent CP bar and  tiny left posterolateral diverticulum inferior to CP muscle. The patient, reports  worsening of symptoms since the last consultation. He noticed that lately his  trouble with swallowing is more pronounced when attempting to swallow pills, even when cut into quarters. The patient reports that the pill gets stuck in their throat and they have to wait for it to dissolve. Scope revealed: There was evidence of bilateral vocal fold movement, but he did have bilateral vocal fold atrophy and I suspect some degree of glottic insufficiency. There was moderate postcricoid edema and pachydermia. There was no pooling of secretions in hypopharynx or vallecula. I did pass the scope past CP bar while he was swallowing water and UES appeared to open without issues and I did not visualize any strictures or diverticula. We discussed a trial of swallow therapy vs EGD and balloon dilation at the CP/UES/PES level, discussed risks and benefits, outcomes and expectations and he would like to hold off on proceeding with the surgery at this time - will refer to swallow therapy    Chronic GERD -Continue Prilosec 20 mg daily -Diet and lifestyle changes to minimize reflux - trial of reflux gourmet    RTC in 6 months, and if no improvement or worsening of sx, will consider EGD and balloon dilation   PAIN:  Are you having pain? No  PATIENT GOALS: Improve swallow function  OBJECTIVE:  Note: Objective measures were completed at Evaluation unless otherwise noted. OBJECTIVE:   DIAGNOSTIC FINDINGS:   Pt had BaSw 01/24/2023: IMPRESSION: 1. Mild long segment fixed narrowing of the distal esophagus. 2. Moderate esophageal dysmotility. 3. Mild gastroesophageal reflux.  INSTRUMENTAL SWALLOW STUDY FINDINGS (MBSS) 02/21/2023   Clinical Impression: Clinical Impression: Pt presents with mild oropharyngeal dysphagia characterized by swallow trigger at the level of the valleculae across consistencies, minimally reduced tongue base retraction, min posterior pharyngeal wall stripping, and prominent cricopharyngeus with resulting min vallecular residue and pyriform residue, which clears with  repeat/dry swallow. While Pt does present with prominent CP, only trace amount of residuals noted in pyriforms, the pill passed through. Pt reports that he has had a choking episode with a Frito when he was eating while driving and with chicken yesterday. He reports that he is the last one to get get up from the table because he has to chew forever due to fear of choking. Pt did not exhibit penetration, aspiration, or significant residuals in pharynx, however over the course of a meal and/or when eating different textures (meats etc), Pt may potentially have greater difficulty. Pt can continue to have regular textures, however it is recommended that he chop his meats well and consider adding moisture to dry solids (avoid problematic foods). Pt is to see the ENT next week and this report will be faxed (imaging can be found in EPIC). No further SLP services indicated at this time.  COGNITION: Overall cognitive status: Within functional limits for tasks assessed Areas of impairment:  N/A  SUBJECTIVE DYSPHAGIA REPORTS:  Date of onset: 02/21/2023 but longstanding Reported symptoms: coughing with both solids and liquids, choking with both solids and liquids, globus sensation,  and feeling of swallow being effortful  Current diet: regular and thin liquids  Co-morbid voice changes: No  FACTORS WHICH MAY INCREASE RISK OF ADVERSE EVENT IN PRESENCE OF ASPIRATION:  General health: well appearing  Risk factors: none evident     ORAL MOTOR EXAMINATION: Overall status: WFL Comments: N/A  CLINICAL SWALLOW ASSESSMENT:   Dentition: adequate natural dentition Vocal quality at baseline: normal Patient directly observed with POs: Yes: thin liquids  Feeding: able to feed self Liquids provided by: cup [facility] Swallow Protocol: Pass Oral phase signs and symptoms:  N/A Pharyngeal phase signs and symptoms: complaints of globus  TODAY'S TREATMENT: Pt reports choking episode on Cheerios and when taking pills  with water. He stated that he forgot to try taking his pills with applesauce. SLP provided education regarding mixed consistencies (milk Cheerios, pill with water, etc) and that it can be difficult to manage mastication or bolus hold of a solid while also consuming a liquid (premature spillage over base of tongue). SLP suggested that he try scooping just the Cheerios into his spoon with minimal milk and to take his pills with applesauce to see if this mitigates choking episodes. SLP showed Pt how to complete the following exercises: Effortful swallow, masako, Mendelsohn, Lingual press, and chin tuck against resistance. Pt was able to complete and return demonstrate with mi/mod cues. Pt to complete for HEP 3x daily and report back. Continue plan of care.                                                                                                                                      DATE: 05/12/23   PATIENT EDUCATION: Education details: Plan for trial period of dysphagia therapy per ENT request Person educated: Patient Education method: Explanation and Handouts Education comprehension: verbalized understanding   ASSESSMENT:  CLINICAL IMPRESSION: (from initial evaluation 04/20/23) Patient is an 83 y.o. male who was seen today for a clinical swallow evaluation and follow up from MBSS completed 02/2023. He was referred back to SLP from ENT (Dr. Okey) to try dysphagia therapy before possibly proceeding with UES dilation. MBSS showed: mild oropharyngeal dysphagia characterized by swallow trigger at the level of the valleculae across consistencies, minimally reduced tongue base retraction, min posterior pharyngeal wall stripping, and prominent cricopharyngeus with resulting min vallecular residue and pyriform residue, which clears with repeat/dry swallow. While Pt does present with prominent CP, only trace amount of residuals noted in pyriforms, the pill passed through. Today, he reports waking 1-2x  per night with coughing and expectorates thick, yellow/tan phlegm. He takes omeprazole  once per day in the morning and has the ability to elevate the head of his bed. He reports that pills get hung especially large capsules and ones with sharp edges, getting strangled on his saliva when not eating/drinking, and that swallowing feels effortful. Will proceed with dysphagia therapy once per week for 4 weeks to focus on Pt education,  pharyngeal swallowing exercises, and reflux management strategies.   OBJECTIVE IMPAIRMENTS: include dysphagia. These impairments are limiting patient from safety when swallowing. Factors affecting potential to achieve goals and functional outcome are co-morbidities and severity of impairments. Patient will benefit from skilled SLP services to address above impairments and improve overall function.  REHAB POTENTIAL: Good   GOALS: Goals reviewed with patient? Yes  SHORT TERM GOALS: Target date: 05/30/2023  Pt will complete pharyngeal swallowing exercises including, but not limited to: masako, effortful swallow, Mendelsohn, lingual press, chin tuck against resistance (CTAR), and laryngeal closure with initial model provided by SLP and daily completion of 3x/day per Pt self report. Baseline: Introduced this date Goal status: ONGOING  2.  Pt will report understanding of management of laryngopharyngeal reflux through dietary and behavioral modifications. a. Take reflux medication correctly 80% of the time (30-60 minutes before a meal) b. Elevate the head of the bed by 4-6" c. Avoid eating 2-3 hours before lying down 80% of the time Baseline: Pt takes PPI once per day, does not elevate HOB but is able to Goal status: ONGOING  3.  Pt will independently adhere to aspiration precautions/compensatory strategies for consumption of least restrictive PO to avoid/reduce clinical s/sx aspiration, across 2-3 SLP sessions.  Baseline: Pt reports discomfort when swallowing pills and  occasional coughing/strangling with foods/liquids Goal status: ONGOING  LONG TERM GOALS: Same as short term goals  PLAN:  SLP FREQUENCY: 1x/week  SLP DURATION: 4 weeks  PLANNED INTERVENTIONS: 92526 Treatment of swallowing function, Re-evaluation, Aspiration precaution training, Pharyngeal strengthening exercises, Diet toleration management , SLP instruction and feedback, and Compensatory strategies   Thank you,  Lamar Candy, CCC-SLP [phone]  Lasha Echeverria, CCC-SLP 05/12/2023, 9:08 AM

## 2023-05-18 ENCOUNTER — Ambulatory Visit (HOSPITAL_COMMUNITY): Payer: Medicare Other | Admitting: Speech Pathology

## 2023-05-18 ENCOUNTER — Encounter (HOSPITAL_COMMUNITY): Payer: Self-pay | Admitting: Speech Pathology

## 2023-05-18 DIAGNOSIS — R1312 Dysphagia, oropharyngeal phase: Secondary | ICD-10-CM | POA: Diagnosis not present

## 2023-05-18 NOTE — Therapy (Signed)
 OUTPATIENT SPEECH LANGUAGE PATHOLOGY SWALLOW TREATMENT   Patient Name: [name] MRN: [id number] DOB:[birth year], 83 y.o., male Today's Date: 05/18/2023  PCP: [name], MD REFERRING PROVIDER: Okey Burns, MD  END OF SESSION:  End of Session - 05/18/23 0854     Visit Number 3    Number of Visits 4    Date for SLP Re-Evaluation 05/26/23    Authorization Type Medicare    SLP Start Time 0854    SLP Stop Time  0930    SLP Time Calculation (min) 36 min    Activity Tolerance Patient tolerated treatment well             Past Medical History:  Diagnosis Date   Allergy    Arthritis    CAD (coronary artery disease)    Nonobstructive CAD with the exception of a nondominant RCA that was managed medically in April 2020.   Colon polyps    Adenomatous   Constipation    COPD (chronic obstructive pulmonary disease) (HCC)    Diverticulosis    Essential hypertension    GERD (gastroesophageal reflux disease)    History of anxiety    History of depression    Hypercholesterolemia    Hypothyroidism    Pituitary microadenoma (HCC)    Status post resection 2009   Prostate cancer (HCC)    Senile purpura (HCC) 10/23/2018   Past Surgical History:  Procedure Laterality Date   BACK SURGERY  1991   CHOLECYSTECTOMY  08/2007   COLONOSCOPY     LEFT HEART CATH AND CORONARY ANGIOGRAPHY N/A 09/01/2018   Procedure: LEFT HEART CATH AND CORONARY ANGIOGRAPHY;  Surgeon: Burnard Debby LABOR, MD;  Location: [facility];  Service: Cardiovascular;  Laterality: N/A;   POLYPECTOMY     PROSTATE SURGERY     Removal of pituitary tumor  9/09   UPPER GASTROINTESTINAL ENDOSCOPY     Patient Active Problem List   Diagnosis Date Noted   Acute hyponatremia 01/21/2023   Generalized weakness 01/21/2023   Anxiety 01/21/2023   Diabetes insipidus (HCC) 07/30/2020   Coronary artery disease involving native coronary artery of native heart with angina pectoris (HCC) 07/30/2020   Prediabetes  02/16/2020   Peripheral arterial disease (HCC) 09/11/2019   COPD GOLD II  03/14/2019   DOE (dyspnea on exertion) 03/14/2019   Senile purpura (HCC) 10/23/2018   Chest pain 09/29/2018   Unstable angina (HCC)    Hyponatremia 09/06/2016   History of prostate cancer 12/17/2013   Leg weakness, bilateral 09/17/2013   Stiffness of joints, not elsewhere classified, multiple sites 09/17/2013   Radicular leg pain 09/17/2013   Loss of weight 11/27/2012   Hypothyroidism 11/27/2012   Constipation 07/01/2008   RECTAL BLEEDING 07/01/2008   History of colonic polyps 07/01/2008   PITUITARY MACROADENOMA 06/27/2008   HYPERCHOLESTEROLEMIA 06/27/2008   Essential hypertension 06/27/2008   GERD 06/27/2008   COLONIC POLYPS 07/03/2003   DIVERTICULOSIS, COLON 07/03/2003    ONSET DATE: 02/21/2023   REFERRING DIAG: J39.2 (ICD-10-CM) - Cricopharyngeal hypertrophy R13.13 (ICD-10-CM) - Dysphagia, cricopharyngeal K22.4 (ICD-10-CM) - Esophageal dysmotility R13.12 (ICD-10-CM) - Oropharyngeal dysphagia  THERAPY DIAG:  Dysphagia, oropharyngeal phase  Rationale for Evaluation and Treatment: Rehabilitation  SUBJECTIVE:   SUBJECTIVE STATEMENT: It seems like I am getting better at it.  Pt accompanied by: self  PERTINENT HISTORY:  Per Dr. Okey (ENT): [name] is an 83 y.o. male with history of prostate cancer, history of pituitary adenoma,  longstanding history of trouble swallowing, here  for initial evaluation with me.  Previously seen by GI had upper endoscopy 2021, which showed tight CP bar per report.  He was referred to be seen at a tertiary care center and was seen at [facility] by another GI physician who recommended ENT consultation due to prominent CP bar.  He also had an esophagram several years ago (2021), and it showed prominent CP bar and  tiny left posterolateral diverticulum inferior to CP muscle. The patient, reports  worsening of symptoms since the last consultation. He noticed that lately  his trouble with swallowing is more pronounced when attempting to swallow pills, even when cut into quarters. The patient reports that the pill gets stuck in their throat and they have to wait for it to dissolve. Scope revealed: There was evidence of bilateral vocal fold movement, but he did have bilateral vocal fold atrophy and I suspect some degree of glottic insufficiency. There was moderate postcricoid edema and pachydermia. There was no pooling of secretions in hypopharynx or vallecula. I did pass the scope past CP bar while he was swallowing water and UES appeared to open without issues and I did not visualize any strictures or diverticula. We discussed a trial of swallow therapy vs EGD and balloon dilation at the CP/UES/PES level, discussed risks and benefits, outcomes and expectations and he would like to hold off on proceeding with the surgery at this time - will refer to swallow therapy    Chronic GERD -Continue Prilosec 20 mg daily -Diet and lifestyle changes to minimize reflux - trial of reflux gourmet    RTC in 6 months, and if no improvement or worsening of sx, will consider EGD and balloon dilation   PAIN:  Are you having pain? No  PATIENT GOALS: Improve swallow function  OBJECTIVE:  Note: Objective measures were completed at Evaluation unless otherwise noted. OBJECTIVE:   DIAGNOSTIC FINDINGS:   Pt had BaSw 01/24/2023: IMPRESSION: 1. Mild long segment fixed narrowing of the distal esophagus. 2. Moderate esophageal dysmotility. 3. Mild gastroesophageal reflux.  INSTRUMENTAL SWALLOW STUDY FINDINGS (MBSS) 02/21/2023   Clinical Impression: Clinical Impression: Pt presents with mild oropharyngeal dysphagia characterized by swallow trigger at the level of the valleculae across consistencies, minimally reduced tongue base retraction, min posterior pharyngeal wall stripping, and prominent cricopharyngeus with resulting min vallecular residue and pyriform residue, which clears with  repeat/dry swallow. While Pt does present with prominent CP, only trace amount of residuals noted in pyriforms, the pill passed through. Pt reports that he has had a choking episode with a Frito when he was eating while driving and with chicken yesterday. He reports that he is the last one to get get up from the table because he has to chew forever due to fear of choking. Pt did not exhibit penetration, aspiration, or significant residuals in pharynx, however over the course of a meal and/or when eating different textures (meats etc), Pt may potentially have greater difficulty. Pt can continue to have regular textures, however it is recommended that he chop his meats well and consider adding moisture to dry solids (avoid problematic foods). Pt is to see the ENT next week and this report will be faxed (imaging can be found in EPIC). No further SLP services indicated at this time.  COGNITION: Overall cognitive status: Within functional limits for tasks assessed Areas of impairment:  N/A  SUBJECTIVE DYSPHAGIA REPORTS:  Date of onset: 02/21/2023 but longstanding Reported symptoms: coughing with both solids and liquids, choking with both solids and liquids, globus  sensation, and feeling of swallow being effortful  Current diet: regular and thin liquids  Co-morbid voice changes: No  FACTORS WHICH MAY INCREASE RISK OF ADVERSE EVENT IN PRESENCE OF ASPIRATION:  General health: well appearing  Risk factors: none evident     ORAL MOTOR EXAMINATION: Overall status: WFL Comments: N/A  CLINICAL SWALLOW ASSESSMENT:   Dentition: adequate natural dentition Vocal quality at baseline: normal Patient directly observed with POs: Yes: thin liquids  Feeding: able to feed self Liquids provided by: cup [facility] Swallow Protocol: Pass Oral phase signs and symptoms:  N/A Pharyngeal phase signs and symptoms: complaints of globus  PREVIOUS TREATMENT: Pt reports choking episode on Cheerios and when taking pills  with water. He stated that he forgot to try taking his pills with applesauce. SLP provided education regarding mixed consistencies (milk Cheerios, pill with water, etc) and that it can be difficult to manage mastication or bolus hold of a solid while also consuming a liquid (premature spillage over base of tongue). SLP suggested that he try scooping just the Cheerios into his spoon with minimal milk and to take his pills with applesauce to see if this mitigates choking episodes. SLP showed Pt how to complete the following exercises: Effortful swallow, masako, Mendelsohn, Lingual press, and chin tuck against resistance. Pt was able to complete and return demonstrate with mi/mod cues. Pt to complete for HEP 3x daily and report back. Continue plan of care.    PREVIOUS TREATMENT: Pt reports completion of HEP as assigned and was able to return demonstrate with min cues. He was advised to take his medications with puree, however he stated that he is not having trouble taking small pills with water. He has started trying to eat his Cheerios and his milk separately and states that has helped some. He also notes that he starts coughing more at the end of his meals. He was eating a fried chicken sandwich the other day and had to stop eating it toward the end because he felt like he was having more difficulty. He indicates he is most bothered by his inability to get a good nights sleep and would like this improved most of all. He was encouraged to discuss with his doctor. He reports feeling hesitant to take Paxil in case he does not tolerate it. He was asked to also discuss with his doctor and perhaps explore other medications if indicated. Pt completed: Effortful swallow, masako, Mendelsohn, Lingual press, and chin tuck against resistance and reminded to complete 3x daily at home going forward. Will plan to see Pt for one more session prior to d/c.                                                                                                                                 DATE: 05/18/23   PATIENT EDUCATION: Education details: Plan for trial period of dysphagia therapy per ENT request Person educated: Patient Education method: Explanation and Handouts  Education comprehension: verbalized understanding   ASSESSMENT:  CLINICAL IMPRESSION: (from initial evaluation 04/20/23) Patient is an 83 y.o. male who was seen today for a clinical swallow evaluation and follow up from MBSS completed 02/2023. He was referred back to SLP from ENT (Dr. Okey) to try dysphagia therapy before possibly proceeding with UES dilation. MBSS showed: mild oropharyngeal dysphagia characterized by swallow trigger at the level of the valleculae across consistencies, minimally reduced tongue base retraction, min posterior pharyngeal wall stripping, and prominent cricopharyngeus with resulting min vallecular residue and pyriform residue, which clears with repeat/dry swallow. While Pt does present with prominent CP, only trace amount of residuals noted in pyriforms, the pill passed through. Today, he reports waking 1-2x per night with coughing and expectorates thick, yellow/tan phlegm. He takes omeprazole  once per day in the morning and has the ability to elevate the head of his bed. He reports that pills get hung especially large capsules and ones with sharp edges, getting strangled on his saliva when not eating/drinking, and that swallowing feels effortful. Will proceed with dysphagia therapy once per week for 4 weeks to focus on Pt education, pharyngeal swallowing exercises, and reflux management strategies.   OBJECTIVE IMPAIRMENTS: include dysphagia. These impairments are limiting patient from safety when swallowing. Factors affecting potential to achieve goals and functional outcome are co-morbidities and severity of impairments. Patient will benefit from skilled SLP services to address above impairments and improve overall  function.  REHAB POTENTIAL: Good   GOALS: Goals reviewed with patient? Yes  SHORT TERM GOALS: Target date: 05/30/2023  Pt will complete pharyngeal swallowing exercises including, but not limited to: masako, effortful swallow, Mendelsohn, lingual press, chin tuck against resistance (CTAR), and laryngeal closure with initial model provided by SLP and daily completion of 3x/day per Pt self report. Baseline: Introduced this date Goal status: ONGOING  2.  Pt will report understanding of management of laryngopharyngeal reflux through dietary and behavioral modifications. a. Take reflux medication correctly 80% of the time (30-60 minutes before a meal) b. Elevate the head of the bed by 4-6" c. Avoid eating 2-3 hours before lying down 80% of the time Baseline: Pt takes PPI once per day, does not elevate HOB but is able to Goal status: ONGOING  3.  Pt will independently adhere to aspiration precautions/compensatory strategies for consumption of least restrictive PO to avoid/reduce clinical s/sx aspiration, across 2-3 SLP sessions.  Baseline: Pt reports discomfort when swallowing pills and occasional coughing/strangling with foods/liquids Goal status: ONGOING  LONG TERM GOALS: Same as short term goals  PLAN:  SLP FREQUENCY: 1x/week  SLP DURATION: 4 weeks  PLANNED INTERVENTIONS: 92526 Treatment of swallowing function, Re-evaluation, Aspiration precaution training, Pharyngeal strengthening exercises, Diet toleration management , SLP instruction and feedback, and Compensatory strategies   Thank you,  Lamar Candy, CCC-SLP [phone]  Natausha Jungwirth, CCC-SLP 05/18/2023, 8:55 AM

## 2023-05-25 ENCOUNTER — Ambulatory Visit (HOSPITAL_COMMUNITY): Payer: Medicare Other | Admitting: Speech Pathology

## 2023-05-26 ENCOUNTER — Ambulatory Visit (HOSPITAL_COMMUNITY): Payer: Self-pay | Admitting: Speech Pathology

## 2023-05-31 ENCOUNTER — Ambulatory Visit: Payer: Medicare Other | Attending: Cardiology | Admitting: Cardiology

## 2023-05-31 ENCOUNTER — Encounter: Payer: Self-pay | Admitting: Cardiology

## 2023-05-31 VITALS — BP 126/58 | HR 60 | Ht 67.0 in | Wt 137.8 lb

## 2023-05-31 DIAGNOSIS — I739 Peripheral vascular disease, unspecified: Secondary | ICD-10-CM | POA: Diagnosis not present

## 2023-05-31 DIAGNOSIS — E782 Mixed hyperlipidemia: Secondary | ICD-10-CM | POA: Insufficient documentation

## 2023-05-31 DIAGNOSIS — I6523 Occlusion and stenosis of bilateral carotid arteries: Secondary | ICD-10-CM | POA: Diagnosis not present

## 2023-05-31 DIAGNOSIS — I25119 Atherosclerotic heart disease of native coronary artery with unspecified angina pectoris: Secondary | ICD-10-CM | POA: Diagnosis not present

## 2023-05-31 NOTE — Progress Notes (Signed)
    Cardiology Office Note  Date: 05/31/2023   ID: Link, Mcfeeley Oct 05, 1940, MRN [id number]  History of Present Illness: [name] is an 83 y.o. male last seen in January 2024.  He is here for a routine visit.  Reports no exertional chest pain or increasing dyspnea on exertion with typical activities.  States that he follows at the [facility], has been told that he has PAD although some of his symptoms sound more neuropathic.  Not clear that he has been specifically tested.  I reviewed his medications.  Current regimen includes aspirin, Imdur, Lopressor, Pravachol, and as needed nitroglycerin.  He continues to follow with Dr. Gerda Diss.  I reviewed his interval ECG and lab work.  Physical Exam: VS:  BP (!) 126/58   Pulse 60   Ht 5\' 7"  (1.702 m)   Wt 137 lb 12.8 oz (62.5 kg)   SpO2 95%   BMI 21.58 kg/m , BMI Body mass index is 21.58 kg/m.  Wt Readings from Last 3 Encounters:  05/31/23 137 lb 12.8 oz (62.5 kg)  01/27/23 138 lb 3.2 oz (62.7 kg)  01/21/23 140 lb 6.9 oz (63.7 kg)    General: Patient appears comfortable at rest. HEENT: Conjunctiva and lids normal. Neck: Supple, no elevated JVP, right carotid bruit. Lungs: Clear to auscultation, nonlabored breathing at rest. Cardiac: Regular rate and rhythm, no S3 or significant systolic murmur, no pericardial rub. Extremities: No pitting edema.  1+ DPs bilaterally.  ECG:  An ECG dated 01/21/2023 was personally reviewed today and demonstrated:  Sinus rhythm with prolonged PR interval.  Labwork: 01/22/2023: ALT 14; AST 18; BUN 10; Creatinine, Ser 0.82; Hemoglobin 14.7; Magnesium 2.1; Platelets 157; Potassium 4.4; Sodium 135; TSH 4.240     Component Value Date/Time   CHOL 147 05/17/2022 0814   TRIG 68 05/17/2022 0814   HDL 46 05/17/2022 0814   CHOLHDL 3.2 05/17/2022 0814   CHOLHDL 2.7 10/24/2020 0759   VLDL 22 07/16/2013 0938   LDLCALC 87 05/17/2022 0814   LDLCALC 57 10/24/2020 0759   Other Studies Reviewed  Today:  No interval cardiac testing for review today.  Assessment and Plan:  1.  CAD, overall nonobstructive with the exception of a nondominant RCA that was managed medically at angiography in April 2020.  Echocardiogram in 2021 revealed LVEF 55 to 60% without regional wall motion abnormalities.  He does not report any active angina.  I reviewed his interval ECG.  Continue aspirin, Imdur, Lopressor, Pravachol, and as needed nitroglycerin.   2.  Mixed hyperlipidemia.  LDL 87 in January of last year.  He continues to follow with Dr. Gerda Diss and is on Pravachol.   3.  Asymptomatic carotid artery disease.  Carotid Dopplers in 2022 revealed less than 50% bilateral ICA stenosis.  Plan to update carotid Dopplers.  4.  Possible diagnosis of PAD made through the [facility] system.  Does report intermittent leg pain, right worse than left and possible neuropathic symptoms.  Will obtain ABIs.  Disposition:  Follow up  1 year.  Signed, Jonelle Sidle, M.D., F.A.C.C. [facility]

## 2023-05-31 NOTE — Patient Instructions (Signed)
Medication Instructions:  Your physician recommends that you continue on your current medications as directed. Please refer to the Current Medication list given to you today.   Labwork: None today  Testing/Procedures: Your physician has requested that you have a carotid duplex. This test is an ultrasound of the carotid arteries in your neck. It looks at blood flow through these arteries that supply the brain with blood. Allow one hour for this exam. There are no restrictions or special instructions.  Your physician has requested that you have an ankle brachial index (ABI). During this test an ultrasound and blood pressure cuff are used to evaluate the arteries that supply the arms and legs with blood. Allow thirty minutes for this exam. There are no restrictions or special instructions.  Please note: We ask at that you not bring children with you during ultrasound (echo/ vascular) testing. Due to room size and safety concerns, children are not allowed in the ultrasound rooms during exams. Our front office staff cannot provide observation of children in our lobby area while testing is being conducted. An adult accompanying a patient to their appointment will only be allowed in the ultrasound room at the discretion of the ultrasound technician under special circumstances. We apologize for any inconvenience.   Follow-Up: 1 year with Dr.McDowell  Any Other Special Instructions Will Be Listed Below (If Applicable).  If you need a refill on your cardiac medications before your next appointment, please call your pharmacy.

## 2023-06-01 ENCOUNTER — Encounter (HOSPITAL_COMMUNITY): Payer: Self-pay | Admitting: Speech Pathology

## 2023-06-01 ENCOUNTER — Ambulatory Visit (HOSPITAL_COMMUNITY): Payer: Medicare Other | Admitting: Speech Pathology

## 2023-06-01 DIAGNOSIS — R1312 Dysphagia, oropharyngeal phase: Secondary | ICD-10-CM | POA: Diagnosis not present

## 2023-06-01 NOTE — Therapy (Signed)
OUTPATIENT SPEECH LANGUAGE PATHOLOGY SWALLOW TREATMENT   Patient Name: [name] MRN: [id number] DOB:January 13, 1941, 83 y.o., male Today's Date: 06/01/2023  PCP: Lance Sciara, MD REFERRING PROVIDER: Ashok Croon, MD  END OF SESSION:  End of Session - 06/01/23 1105     Visit Number 4    Number of Visits 4    Date for SLP Re-Evaluation 06/01/23    Authorization Type Medicare    SLP Start Time 0850    SLP Stop Time  0930    SLP Time Calculation (min) 40 min    Activity Tolerance Patient tolerated treatment well             Past Medical History:  Diagnosis Date   Allergy    Arthritis    CAD (coronary artery disease)    Nonobstructive CAD with the exception of a nondominant RCA that was managed medically in April 2020.   Colon polyps    Adenomatous   Constipation    COPD (chronic obstructive pulmonary disease) (HCC)    Diverticulosis    Essential hypertension    GERD (gastroesophageal reflux disease)    History of anxiety    History of depression    Hypercholesterolemia    Hypothyroidism    Pituitary microadenoma (HCC)    Status post resection 2009   Prostate cancer (HCC)    Senile purpura (HCC) 10/23/2018   Past Surgical History:  Procedure Laterality Date   BACK SURGERY  1991   CHOLECYSTECTOMY  08/2007   COLONOSCOPY     LEFT HEART CATH AND CORONARY ANGIOGRAPHY N/A 09/01/2018   Procedure: LEFT HEART CATH AND CORONARY ANGIOGRAPHY;  Surgeon: Lance Bihari, MD;  Location: [facility];  Service: Cardiovascular;  Laterality: N/A;   POLYPECTOMY     PROSTATE SURGERY     Removal of pituitary tumor  9/09   UPPER GASTROINTESTINAL ENDOSCOPY     Patient Active Problem List   Diagnosis Date Noted   Acute hyponatremia 01/21/2023   Generalized weakness 01/21/2023   Anxiety 01/21/2023   Diabetes insipidus (HCC) 07/30/2020   Coronary artery disease involving native coronary artery of native heart with angina pectoris (HCC) 07/30/2020   Prediabetes  02/16/2020   Peripheral arterial disease (HCC) 09/11/2019   COPD GOLD II  03/14/2019   DOE (dyspnea on exertion) 03/14/2019   Senile purpura (HCC) 10/23/2018   Chest pain 09/29/2018   Unstable angina (HCC)    Hyponatremia 09/06/2016   History of prostate cancer 12/17/2013   Leg weakness, bilateral 09/17/2013   Stiffness of joints, not elsewhere classified, multiple sites 09/17/2013   Radicular leg pain 09/17/2013   Loss of weight 11/27/2012   Hypothyroidism 11/27/2012   Constipation 07/01/2008   RECTAL BLEEDING 07/01/2008   History of colonic polyps 07/01/2008   PITUITARY MACROADENOMA 06/27/2008   HYPERCHOLESTEROLEMIA 06/27/2008   Essential hypertension 06/27/2008   GERD 06/27/2008   COLONIC POLYPS 07/03/2003   DIVERTICULOSIS, COLON 07/03/2003    ONSET DATE: 02/21/2023   REFERRING DIAG: J39.2 (ICD-10-CM) - Cricopharyngeal hypertrophy R13.13 (ICD-10-CM) - Dysphagia, cricopharyngeal K22.4 (ICD-10-CM) - Esophageal dysmotility R13.12 (ICD-10-CM) - Oropharyngeal dysphagia  THERAPY DIAG:  Dysphagia, oropharyngeal phase  Rationale for Evaluation and Treatment: Rehabilitation  SUBJECTIVE:   SUBJECTIVE STATEMENT: "I can live with how things are with my swallowing."  Pt accompanied by: self  PERTINENT HISTORY:  Per [name] (ENT): [name] is an 83 y.o. male with history of prostate cancer, history of pituitary adenoma,  longstanding history of trouble swallowing,  here for initial evaluation with me.  Previously seen by GI had upper endoscopy 2021, which showed tight CP bar per report.  He was referred to be seen at a tertiary care center and was seen at [facility] by another GI physician who recommended ENT consultation due to prominent CP bar.  He also had an esophagram several years ago (2021), and it showed prominent CP bar and " tiny left posterolateral diverticulum inferior to CP muscle". The patient, reports  worsening of symptoms since the last consultation. He noticed that  lately his trouble with swallowing is more pronounced when attempting to swallow pills, even when cut into quarters. The patient reports that the pill gets stuck in their throat and they have to wait for it to dissolve. Scope revealed: There was evidence of bilateral vocal fold movement, but he did have bilateral vocal fold atrophy and I suspect some degree of glottic insufficiency. There was moderate postcricoid edema and pachydermia. There was no pooling of secretions in hypopharynx or vallecula. I did pass the scope past CP bar while he was swallowing water and UES appeared to open without issues and I did not visualize any strictures or diverticula. We discussed a trial of swallow therapy vs EGD and balloon dilation at the CP/UES/PES level, discussed risks and benefits, outcomes and expectations and he would like to hold off on proceeding with the surgery at this time - will refer to swallow therapy    Chronic GERD -Continue Prilosec 20 mg daily -Diet and lifestyle changes to minimize reflux - trial of reflux gourmet    RTC in 6 months, and if no improvement or worsening of sx, will consider EGD and balloon dilation   PAIN:  Are you having pain? No  PATIENT GOALS: Improve swallow function  OBJECTIVE:  Note: Objective measures were completed at Evaluation unless otherwise noted. OBJECTIVE:   DIAGNOSTIC FINDINGS:   Pt had BaSw 01/24/2023: IMPRESSION: 1. Mild long segment fixed narrowing of the distal esophagus. 2. Moderate esophageal dysmotility. 3. Mild gastroesophageal reflux.  INSTRUMENTAL SWALLOW STUDY FINDINGS (MBSS) 02/21/2023   Clinical Impression: Clinical Impression: Pt presents with mild oropharyngeal dysphagia characterized by swallow trigger at the level of the valleculae across consistencies, minimally reduced tongue base retraction, min posterior pharyngeal wall stripping, and prominent cricopharyngeus with resulting min vallecular residue and pyriform residue, which  clears with repeat/dry swallow. While Pt does present with prominent CP, only trace amount of residuals noted in pyriforms, the pill passed through. Pt reports that he has had a "choking" episode with a Frito when he was eating while driving and with chicken yesterday. He reports that he is the last one to get get up from the table because he has to "chew forever" due to fear of choking. Pt did not exhibit penetration, aspiration, or significant residuals in pharynx, however over the course of a meal and/or when eating different textures (meats etc), Pt may potentially have greater difficulty. Pt can continue to have regular textures, however it is recommended that he chop his meats well and consider adding moisture to dry solids (avoid problematic foods). Pt is to see the ENT next week and this report will be faxed (imaging can be found in EPIC). No further SLP services indicated at this time.  COGNITION: Overall cognitive status: Within functional limits for tasks assessed Areas of impairment:  N/A  SUBJECTIVE DYSPHAGIA REPORTS:  Date of onset: 02/21/2023 but longstanding Reported symptoms: coughing with both solids and liquids, choking with both solids and liquids,  globus sensation, and feeling of swallow being effortful  Current diet: regular and thin liquids  Co-morbid voice changes: No  FACTORS WHICH MAY INCREASE RISK OF ADVERSE EVENT IN PRESENCE OF ASPIRATION:  General health: well appearing  Risk factors: none evident     ORAL MOTOR EXAMINATION: Overall status: WFL Comments: N/A  CLINICAL SWALLOW ASSESSMENT:   Dentition: adequate natural dentition Vocal quality at baseline: normal Patient directly observed with POs: Yes: thin liquids  Feeding: able to feed self Liquids provided by: cup [facility] Swallow Protocol: Pass Oral phase signs and symptoms:  N/A Pharyngeal phase signs and symptoms: complaints of globus  PREVIOUS TREATMENT: Pt reports completion of HEP as assigned and was able  to return demonstrate with min cues. He was advised to take his medications with puree, however he stated that he is not having trouble taking small pills with water. He has started trying to eat his Cheerios and his milk separately and states that has helped some. He also notes that he starts coughing more at the end of his meals. He was eating a fried chicken sandwich the other day and had to stop eating it toward the end because he felt like he was having more difficulty. He indicates he is most bothered by his inability to get a good nights sleep and would like this improved most of all. He was encouraged to discuss with his doctor. He reports feeling hesitant to take Paxil in case he does not tolerate it. He was asked to also discuss with his doctor and perhaps explore other medications if indicated. Pt completed: Effortful swallow, masako, Mendelsohn, Lingual press, and chin tuck against resistance and reminded to complete 3x daily at home going forward. Will plan to see Pt for one more session prior to d/c.  PREVIOUS TREATMENT: Pt reports completion of the following exercises: Effortful swallow, masako, Mendelsohn, Lingual press, and chin tuck against resistance. Pt was able to complete and return demonstrate with initial cue. He was encouraged to complete all exercises going forward indefinitely. He stated that when he concentrates on eating/swallowing and goes slowly, he manages intake without coughing. This morning he reported that he started coughing on his Cheerios because he wasn't "paying attention". SLP reiterated going slowly, masticating solids well, add moisture to dry solids, avoiding mixed consistencies (scoop the Cheerios from the bowl without excess milk), and taking medication with puree. Pt is agreeable to continuing with exercises and monitoring swallow efficiency. Will d/c from SLP services and Pt to complete HEP going forward.                                                                                                                          DATE: 06/01/23   PATIENT EDUCATION: Education details: Plan for trial period of dysphagia therapy per ENT request Person educated: Patient Education method: Explanation and Handouts Education comprehension: verbalized understanding   ASSESSMENT:  CLINICAL IMPRESSION: (from initial evaluation 04/20/23) Patient is an 83 y.o. male who  was seen today for a clinical swallow evaluation and follow up from MBSS completed 02/2023. He was referred back to SLP from ENT ([name]) to try dysphagia therapy before possibly proceeding with UES dilation. MBSS showed: mild oropharyngeal dysphagia characterized by swallow trigger at the level of the valleculae across consistencies, minimally reduced tongue base retraction, min posterior pharyngeal wall stripping, and prominent cricopharyngeus with resulting min vallecular residue and pyriform residue, which clears with repeat/dry swallow. While Pt does present with prominent CP, only trace amount of residuals noted in pyriforms, the pill passed through. Today, he reports waking 1-2x per night with coughing and expectorates thick, yellow/tan phlegm. He takes omeprazole once per day in the morning and has the ability to elevate the head of his bed. He reports that pills "get hung" especially large capsules and ones with sharp edges, getting strangled on his saliva when not eating/drinking, and that swallowing feels effortful. Will proceed with dysphagia therapy once per week for 4 weeks to focus on Pt education, pharyngeal swallowing exercises, and reflux management strategies.   OBJECTIVE IMPAIRMENTS: include dysphagia. These impairments are limiting patient from safety when swallowing. Factors affecting potential to achieve goals and functional outcome are co-morbidities and severity of impairments. Patient will benefit from skilled SLP services to address above impairments and improve overall  function.  REHAB POTENTIAL: Good   GOALS: Goals reviewed with patient? Yes  SHORT TERM GOALS: Target date: 06/01/2023  Pt will complete pharyngeal swallowing exercises including, but not limited to: masako, effortful swallow, Mendelsohn, lingual press, chin tuck against resistance (CTAR), and laryngeal closure with initial model provided by SLP and daily completion of 3x/day per Pt self report. Baseline: Introduced this date Goal status: MET  2.  Pt will report understanding of management of laryngopharyngeal reflux through dietary and behavioral modifications. a. Take reflux medication correctly 80% of the time (30-60 minutes before a meal) b. Elevate the head of the bed by 4-6" c. Avoid eating 2-3 hours before lying down 80% of the time Baseline: Pt takes PPI once per day, does not elevate HOB but is able to Goal status: MET  3.  Pt will independently adhere to aspiration precautions/compensatory strategies for consumption of least restrictive PO to avoid/reduce clinical s/sx aspiration, across 2-3 SLP sessions.  Baseline: Pt reports discomfort when swallowing pills and occasional coughing/"strangling" with foods/liquids Goal status: MET  LONG TERM GOALS: Same as short term goals  PLAN:  SPEECH THERAPY DISCHARGE SUMMARY  Visits from Start of Care: 4  Current functional level related to goals / functional outcomes: See above   Remaining deficits: Mild to mild/mod oropharyngeal dysphagia with esophageal component   Education / Equipment: Continue with pharyngeal swallowing exercises going forward and contact PCP/SLP if changes occur.    Patient agrees to discharge. Patient goals were met. Patient is being discharged due to meeting the stated rehab goals..     Thank you,  Havery Moros, CCC-SLP [phone]  Kainon Varady, CCC-SLP 06/01/2023, 11:06 AM

## 2023-06-03 ENCOUNTER — Ambulatory Visit: Payer: Medicare Other | Admitting: Internal Medicine

## 2023-06-08 ENCOUNTER — Ambulatory Visit (HOSPITAL_COMMUNITY): Payer: Medicare Other

## 2023-06-15 DIAGNOSIS — C61 Malignant neoplasm of prostate: Secondary | ICD-10-CM | POA: Diagnosis not present

## 2023-06-22 DIAGNOSIS — N5201 Erectile dysfunction due to arterial insufficiency: Secondary | ICD-10-CM | POA: Diagnosis not present

## 2023-06-22 DIAGNOSIS — N3946 Mixed incontinence: Secondary | ICD-10-CM | POA: Diagnosis not present

## 2023-06-22 DIAGNOSIS — C61 Malignant neoplasm of prostate: Secondary | ICD-10-CM | POA: Diagnosis not present

## 2023-08-01 ENCOUNTER — Telehealth: Payer: Self-pay | Admitting: Cardiology

## 2023-08-01 NOTE — Telephone Encounter (Signed)
 Patient calling to get his test schedule. Please advise

## 2023-08-01 NOTE — Telephone Encounter (Signed)
 Patient had cancelled 06/08/23 US carotid and US arterial ABI    Please reschedule

## 2023-08-12 ENCOUNTER — Ambulatory Visit (HOSPITAL_COMMUNITY)
Admission: RE | Admit: 2023-08-12 | Discharge: 2023-08-12 | Disposition: A | Discharge status: Home / Self Care | Source: Ambulatory Visit | Attending: Cardiology | Admitting: Cardiology

## 2023-08-12 DIAGNOSIS — I739 Peripheral vascular disease, unspecified: Secondary | ICD-10-CM | POA: Diagnosis not present

## 2023-08-12 DIAGNOSIS — I2510 Atherosclerotic heart disease of native coronary artery without angina pectoris: Secondary | ICD-10-CM | POA: Diagnosis not present

## 2023-08-12 DIAGNOSIS — I6523 Occlusion and stenosis of bilateral carotid arteries: Secondary | ICD-10-CM | POA: Insufficient documentation

## 2023-08-12 DIAGNOSIS — I10 Essential (primary) hypertension: Secondary | ICD-10-CM | POA: Diagnosis not present

## 2023-08-12 DIAGNOSIS — E785 Hyperlipidemia, unspecified: Secondary | ICD-10-CM | POA: Diagnosis not present

## 2023-10-10 ENCOUNTER — Ambulatory Visit (INDEPENDENT_AMBULATORY_CARE_PROVIDER_SITE_OTHER): Payer: Medicare Other | Admitting: Otolaryngology

## 2023-11-23 ENCOUNTER — Ambulatory Visit (INDEPENDENT_AMBULATORY_CARE_PROVIDER_SITE_OTHER): Admitting: Family Medicine

## 2023-11-23 ENCOUNTER — Ambulatory Visit: Payer: Self-pay

## 2023-11-23 VITALS — BP 135/56 | HR 60 | Temp 98.4°F | Ht 67.0 in | Wt 144.4 lb

## 2023-11-23 DIAGNOSIS — M545 Low back pain, unspecified: Secondary | ICD-10-CM | POA: Diagnosis not present

## 2023-11-23 DIAGNOSIS — R252 Cramp and spasm: Secondary | ICD-10-CM | POA: Diagnosis not present

## 2023-11-23 DIAGNOSIS — R195 Other fecal abnormalities: Secondary | ICD-10-CM | POA: Diagnosis not present

## 2023-11-23 NOTE — Telephone Encounter (Signed)
 FYI Only or Action Required?: FYI only for provider.  Patient was last seen in primary care on 01/27/2023 by [name], MD.  Called Nurse Triage reporting Back Pain.  Symptoms began a week ago.  Interventions attempted: OTC medications: aleve.  Symptoms are: stable.  Triage Disposition: See PCP When Office is Open (Within 3 Days)  Patient/caregiver understands and will follow disposition?: Yes       Copied from CRM [id number]. Topic: Clinical - Red Word Triage >> Nov 23, 2023  8:53 AM Marissa P wrote: Red Word that prompted transfer to Nurse Triage: Patient called in having some back  pain and his stool was black he advised would like to be seen. Reason for Disposition  [1] Age > 50 AND [2] no history of prior similar back pain  Answer Assessment - Initial Assessment Questions 1. ONSET: When did the pain begin? (e.g., minutes, hours, days)     A week  2. LOCATION: Where does it hurt? (upper, mid or lower back)     Lower back and mid back 3. SEVERITY: How bad is the pain?  (e.g., Scale 1-10; mild, moderate, or severe)     2/10 4. PATTERN: Is the pain constant? (e.g., yes, no; constant, intermittent)      Comes and goes 5. RADIATION: Does the pain shoot into your legs or somewhere else?     no 6. CAUSE:  What do you think is causing the back pain?      unknown 7. BACK OVERUSE:  Any recent lifting of heavy objects, strenuous work or exercise?     no 8. MEDICINES: What have you taken so far for the pain? (e.g., nothing, acetaminophen , NSAIDS)     nsaids 9. NEUROLOGIC SYMPTOMS: Do you have any weakness, numbness, or problems with bowel/bladder control?     Having some urgency 10. OTHER SYMPTOMS: Do you have any other symptoms? (e.g., fever, abdomen pain, burning with urination, blood in urine)      nausea  Protocols used: Back Pain-A-AH

## 2023-11-23 NOTE — Progress Notes (Signed)
   Subjective:    Patient ID: [name], male    DOB: 01-Aug-1940, 83 y.o.   MRN: [id number]  HPI Pt comes in today with complaints of back pain that has been on going for approx a week, he is not aware of any injury.   Pt also complains that at night he is getting dizzy and lightheaded, slightly during the day but much worse at night.  Medications and allergies reviewed. Patient relates back pain and left hip pain hurts with certain movements.  No numbness or tingling down the legs. Also has a lot of cramps and discomfort in his legs [facility] Recent x-ray showed some possibility of avascular necrosis of the left hip  Review of Systems     Objective:   Physical Exam General-in no acute distress Eyes-no discharge Lungs-respiratory rate normal, CTA CV-no murmurs,RRR Extremities skin warm dry no edema Neuro grossly normal Behavior normal, alert No swelling in the legs feet appear normal       Assessment & Plan:   Lumbar pain More than likely this is causing some of his issues I recommended x-rays patient would like to see how this goes he will let us  know how things go  Dark stools-he needs to do stool test for blood Lab work ordered   As for the possible avascular necrosis he was encouraged to [facility] and they are supposed to set him up with a specialist for further evaluation  Follow-up here within 6 months sooner problems

## 2023-11-23 NOTE — Telephone Encounter (Signed)
 Patient seen in office today.

## 2023-11-24 ENCOUNTER — Telehealth: Payer: Self-pay

## 2023-11-24 ENCOUNTER — Ambulatory Visit: Payer: Self-pay | Admitting: Family Medicine

## 2023-11-24 DIAGNOSIS — R195 Other fecal abnormalities: Secondary | ICD-10-CM | POA: Diagnosis not present

## 2023-11-24 DIAGNOSIS — R252 Cramp and spasm: Secondary | ICD-10-CM | POA: Diagnosis not present

## 2023-11-24 LAB — IFOBT (OCCULT BLOOD): IFOBT: NEGATIVE

## 2023-11-24 NOTE — Telephone Encounter (Signed)
 Pt dropped off stool sample placed at the lab

## 2023-11-25 ENCOUNTER — Other Ambulatory Visit: Payer: Self-pay

## 2023-11-25 DIAGNOSIS — E871 Hypo-osmolality and hyponatremia: Secondary | ICD-10-CM

## 2023-11-25 LAB — CBC WITH DIFFERENTIAL/PLATELET
Basophils Absolute: 0 x10E3/uL (ref 0.0–0.2)
Basos: 1 %
EOS (ABSOLUTE): 0.3 x10E3/uL (ref 0.0–0.4)
Eos: 4 %
Hematocrit: 44.5 % (ref 37.5–51.0)
Hemoglobin: 14.5 g/dL (ref 13.0–17.7)
Immature Grans (Abs): 0 x10E3/uL (ref 0.0–0.1)
Immature Granulocytes: 0 %
Lymphocytes Absolute: 1.5 x10E3/uL (ref 0.7–3.1)
Lymphs: 21 %
MCH: 30.3 pg (ref 26.6–33.0)
MCHC: 32.6 g/dL (ref 31.5–35.7)
MCV: 93 fL (ref 79–97)
Monocytes Absolute: 1 x10E3/uL — ABNORMAL HIGH (ref 0.1–0.9)
Monocytes: 14 %
Neutrophils Absolute: 4.4 x10E3/uL (ref 1.4–7.0)
Neutrophils: 60 %
Platelets: 128 x10E3/uL — ABNORMAL LOW (ref 150–450)
RBC: 4.79 x10E6/uL (ref 4.14–5.80)
RDW: 12.5 % (ref 11.6–15.4)
WBC: 7.3 x10E3/uL (ref 3.4–10.8)

## 2023-11-25 LAB — MAGNESIUM: Magnesium: 1.9 mg/dL (ref 1.6–2.3)

## 2023-11-25 LAB — BASIC METABOLIC PANEL WITH GFR
BUN/Creatinine Ratio: 13 (ref 10–24)
BUN: 12 mg/dL (ref 8–27)
CO2: 21 mmol/L (ref 20–29)
Calcium: 9.2 mg/dL (ref 8.6–10.2)
Chloride: 92 mmol/L — ABNORMAL LOW (ref 96–106)
Creatinine, Ser: 0.89 mg/dL (ref 0.76–1.27)
Glucose: 78 mg/dL (ref 70–99)
Potassium: 5.1 mmol/L (ref 3.5–5.2)
Sodium: 128 mmol/L — ABNORMAL LOW (ref 134–144)
eGFR: 87 mL/min/1.73 (ref >59)

## 2023-12-20 ENCOUNTER — Other Ambulatory Visit (HOSPITAL_COMMUNITY): Payer: Self-pay | Admitting: Physical Medicine and Rehabilitation

## 2023-12-20 DIAGNOSIS — M25552 Pain in left hip: Secondary | ICD-10-CM

## 2023-12-23 ENCOUNTER — Ambulatory Visit (HOSPITAL_COMMUNITY)
Admission: RE | Admit: 2023-12-23 | Discharge: 2023-12-23 | Disposition: A | Discharge status: Home / Self Care | Source: Ambulatory Visit | Attending: Physical Medicine and Rehabilitation | Admitting: Physical Medicine and Rehabilitation

## 2023-12-23 DIAGNOSIS — S73192A Other sprain of left hip, initial encounter: Secondary | ICD-10-CM | POA: Diagnosis not present

## 2023-12-23 DIAGNOSIS — M25552 Pain in left hip: Secondary | ICD-10-CM | POA: Diagnosis not present

## 2023-12-23 DIAGNOSIS — M87052 Idiopathic aseptic necrosis of left femur: Secondary | ICD-10-CM | POA: Diagnosis not present

## 2023-12-23 DIAGNOSIS — M160 Bilateral primary osteoarthritis of hip: Secondary | ICD-10-CM | POA: Diagnosis not present

## 2023-12-29 DIAGNOSIS — L308 Other specified dermatitis: Secondary | ICD-10-CM | POA: Diagnosis not present

## 2024-01-23 ENCOUNTER — Ambulatory Visit: Payer: Self-pay

## 2024-01-23 NOTE — Telephone Encounter (Signed)
 FYI Only or Action Required?: Action required by provider: request for appointment and clinical question for provider.  Patient was last seen in primary care on 11/23/2023 by [name], MD.  Called Nurse Triage reporting Generalized Body Aches.  Symptoms began a week ago.  Interventions attempted: OTC medications: Hylands.  Symptoms are: unchanged.  Triage Disposition: See HCP Within 4 Hours (Or PCP Triage), See PCP When Office is Open (Within 3 Days)  Patient/caregiver understands and will follow disposition?: Yes   Copied from CRM [id number]. Topic: Clinical - Red Word Triage >> Jan 23, 2024 11:32 AM Willma R wrote: [facility] that prompted transfer to Nurse Triage: Patient states for the last week has been having painful body cramps in his legs, ankles, knees, feet and hands. Reason for Disposition  [1] MODERATE pain (e.g., interferes with normal activities) AND [2] present > 3 days  [1] SEVERE pain (e.g., excruciating, unable to do any normal activities) AND [2] not improved 2 hours after pain medicine  Answer Assessment - Initial Assessment Questions No available appts today with Dr. Alphonsa. Patient declined other providers and reports wife does not get off until 5 PM. Patient reports will go to UC near home.  Advised ED if symptoms worsen.   Patient reports stopped taking Paroxetine 10 mg started since mouth was dry; pt reports stop taking this medication 2 weeks, then restarted taking medication last night. Nurse advised to take prescribed medications ast prescribed by provider.  1. ONSET: When did the muscle aches or body pains start?      Week ago; took prednisone  then a week later started cramping 2. LOCATION: What part of your body is hurting? (e.g., entire body, arms, legs)      Legs, ankles, feet, hands, ankles; denies redness, swelling, darken, numbness/weakness 3. SEVERITY: How bad is the pain? (Scale 1-10; or mild, moderate, severe)     10/10; Hyland's  Leg Cramp 4. CAUSE: What do you think is causing the pains?     prednisone  5. FEVER: Do you have a fever? If Yes, ask: What is your temperature, how was it measured, and  when did it start?      Denies fever, chills 6. OTHER SYMPTOMS: Do you have any other symptoms? (e.g., chest pain, cold or flu symptoms, rash, weakness, weight loss)     Denies n/v/ d, chest pain, dizziness, no problems with urination After prednisone ; tongue was dry, somewhat dry not as bad 8. TRAVEL: Have you traveled out of the country in the last month? (e.g., exposures, travel history)     no  Protocols used: Muscle Aches and Body Pain-A-AH

## 2024-01-23 NOTE — Telephone Encounter (Signed)
 Called [facility], spoke Tammy. Patient stopped taking PARoxetine abruptly 2 weeks ago and restarted medication last night.

## 2024-01-27 ENCOUNTER — Ambulatory Visit (INDEPENDENT_AMBULATORY_CARE_PROVIDER_SITE_OTHER): Payer: Medicare Other

## 2024-01-27 VITALS — Ht 67.0 in | Wt 144.0 lb

## 2024-01-27 DIAGNOSIS — Z0000 Encounter for general adult medical examination without abnormal findings: Secondary | ICD-10-CM | POA: Diagnosis not present

## 2024-01-27 NOTE — Progress Notes (Signed)
 Subjective:   [name] is a 83 y.o. who presents for a Medicare Wellness preventive visit.  As a reminder, Annual Wellness Visits don't include a physical exam, and some assessments may be limited, especially if this visit is performed virtually. We may recommend an in-person follow-up visit with your provider if needed.  Visit Complete: Virtual I connected with  [name] on 01/27/24 by a video and audio enabled telemedicine application and verified that I am speaking with the correct person using two identifiers.  Patient Location: Home  Provider Location: Home Office  I discussed the limitations of evaluation and management by telemedicine. The patient expressed understanding and agreed to proceed.  Vital Signs: Because this visit was a virtual/telehealth visit, some criteria may be missing or patient reported. Any vitals not documented were not able to be obtained and vitals that have been documented are patient reported.  Persons Participating in Visit: Patient.  AWV Questionnaire: Yes: Patient Medicare AWV questionnaire was completed by the patient on 01/27/24; I have confirmed that all information answered by patient is correct and no changes since this date.  Cardiac Risk Factors include: advanced age (>39men, >70 women);dyslipidemia;hypertension;male gender     Objective:    Today's Vitals   01/27/24 1428  Weight: 144 lb (65.3 kg)  Height: 5' 7 (1.702 m)   Body mass index is 22.55 kg/m.     01/27/2024    3:02 PM 01/21/2023    5:01 PM 01/21/2023    3:42 PM 11/04/2020    8:32 AM 09/29/2018    4:58 PM 09/01/2018    7:45 AM  Advanced Directives  Does Patient Have a Medical Advance Directive? No No No No No No  Would patient like information on creating a medical advance directive? Yes (MAU/Ambulatory/Procedural Areas - Information given) No - Patient declined No - Patient declined No - Patient declined No - Patient declined  No - Patient declined       Data saved with a previous flowsheet row definition    Current Medications (verified) Outpatient Encounter Medications as of 01/27/2024  Medication Sig   aspirin  EC 81 MG tablet Take 81 mg by mouth daily.   cetirizine (ZYRTEC) 10 MG tablet Take 10 mg by mouth daily.   clobetasol  cream (TEMOVATE ) 0.05 % Apply 1 application topically 2 (two) times daily.   desmopressin  (DDAVP ) 0.1 MG tablet Take 0.1 mg by mouth 4 (four) times daily as needed (Pt takes 3 to 4 times daily).   gabapentin  (NEURONTIN ) 100 MG capsule Take 200 mg by mouth at bedtime.   hydrocortisone cream 0.5 % Apply 1 Application topically 2 (two) times daily.   isosorbide  mononitrate (IMDUR ) 30 MG 24 hr tablet Take 1 tablet (30 mg total) by mouth 2 (two) times daily. (Patient taking differently: Take 60 mg by mouth 2 (two) times daily.)   levothyroxine  (SYNTHROID ) 50 MCG tablet Take 50 mcg by mouth daily before breakfast.   melatonin 1 MG TABS tablet Take 1 mg by mouth at bedtime. 1/2 tablet at bedtime   metoprolol  tartrate (LOPRESSOR ) 25 MG tablet TAKE 1/2 (ONE-HALF) TABLET BY MOUTH TWICE DAILY -- NEEDS APPOINTMENT FOR FURTHER REFILLS   metoprolol  tartrate (LOPRESSOR ) 25 MG tablet Take by mouth.   nitroGLYCERIN  (NITROSTAT ) 0.4 MG SL tablet Place 1 tablet (0.4 mg total) under the tongue every 5 (five) minutes as needed for chest pain (Call 911 if still having chest pain after taking 3rd tablet).   Nutritional Supplements (ENSURE ACTIVE HIGH  PROTEIN) LIQD Take by mouth.   Nutritional Supplements (ENSURE COMPLETE PO) Take 1 Bottle by mouth daily.   omeprazole  (PRILOSEC) 20 MG capsule TAKE 1 CAPSULE BY MOUTH EVERY DAY   PARoxetine (PAXIL) 10 MG tablet Take 10 mg by mouth daily.   Polyethyl Glycol-Propyl Glycol (SYSTANE ULTRA OP) Place 1 drop into both eyes as needed (itchy eyes).   polyethylene glycol powder (GLYCOLAX/MIRALAX) 17 GM/SCOOP powder Take 17 g by mouth as directed.   pravastatin  (PRAVACHOL ) 20 MG tablet Take 1 tablet (20 mg  total) by mouth every evening.   sodium chloride  1 g tablet Take 1 tablet (1 g total) by mouth in the morning, at noon, and at bedtime.   Tiotropium Bromide-Olodaterol (STIOLTO RESPIMAT ) 2.5-2.5 MCG/ACT AERS Inhale 2 puffs into the lungs daily.   No facility-administered encounter medications on file as of 01/27/2024.    Allergies (verified) Codeine, Hydralazine , Losartan , Amlodipine , Mirtazapine, Morphine, Statins, and Tramadol    History: Past Medical History:  Diagnosis Date   Allergy    Arthritis    CAD (coronary artery disease)    Nonobstructive CAD with the exception of a nondominant RCA that was managed medically in April 2020.   Colon polyps    Adenomatous   Constipation    COPD (chronic obstructive pulmonary disease) (HCC)    Diverticulosis    Essential hypertension    GERD (gastroesophageal reflux disease)    History of anxiety    History of depression    Hypercholesterolemia    Hypothyroidism    Pituitary microadenoma (HCC)    Status post resection 2009   Prostate cancer (HCC)    Senile purpura (HCC) 10/23/2018   Past Surgical History:  Procedure Laterality Date   BACK SURGERY  1991   CHOLECYSTECTOMY  08/2007   COLONOSCOPY     LEFT HEART CATH AND CORONARY ANGIOGRAPHY N/A 09/01/2018   Procedure: LEFT HEART CATH AND CORONARY ANGIOGRAPHY;  Surgeon: Burnard Debby LABOR, MD;  Location: [facility];  Service: Cardiovascular;  Laterality: N/A;   POLYPECTOMY     PROSTATE SURGERY     Removal of pituitary tumor  9/09   UPPER GASTROINTESTINAL ENDOSCOPY     Family History  Problem Relation Age of Onset   Stomach cancer Mother    Clotting disorder Father    Heart disease Father    Heart attack Father    Colon cancer Neg Hx    Diabetes Neg Hx    Colon polyps Neg Hx    Rectal cancer Neg Hx    Social History   Socioeconomic History   Marital status: Married    Spouse name: Not on file   Number of children: 2   Years of education: Not on file   Highest  education level: Not on file  Occupational History   Occupation: Retired  Tobacco Use   Smoking status: Former    Types: Cigarettes   Smokeless tobacco: Never   Tobacco comments:    quit smoking in early 80's  Vaping Use   Vaping status: Never Used  Substance and Sexual Activity   Alcohol use: Not Currently    Alcohol/week: 0.0 standard drinks of alcohol   Drug use: Not Currently   Sexual activity: Not on file  Other Topics Concern   Not on file  Social History Narrative   Married with 2 children   Right handed   12+   2-3 cups daily   Social Drivers of Health   Financial Resource Strain: Low Risk  (  01/27/2024)   Overall Financial Resource Strain (CARDIA)    Difficulty of Paying Living Expenses: Not hard at all  Food Insecurity: No Food Insecurity (01/27/2024)   Hunger Vital Sign    Worried About Running Out of Food in the Last Year: Never true    Ran Out of Food in the Last Year: Never true  Transportation Needs: No Transportation Needs (01/27/2024)   PRAPARE - Administrator, Civil Service (Medical): No    Lack of Transportation (Non-Medical): No  Physical Activity: Inactive (01/27/2024)   Exercise Vital Sign    Days of Exercise per Week: 0 days    Minutes of Exercise per Session: 0 min  Stress: Stress Concern Present (01/27/2024)   [employer] of Occupational Health - Occupational Stress Questionnaire    Feeling of Stress: To some extent  Social Connections: Moderately Isolated (01/27/2024)   Social Connection and Isolation Panel    Frequency of Communication with Friends and Family: Once a week    Frequency of Social Gatherings with Friends and Family: Once a week    Attends Religious Services: More than 4 times per year    Active Member of [employer] or Organizations: No    Attends Banker Meetings: Never    Marital Status: Married    Tobacco Counseling Counseling given: Not Answered Tobacco comments: quit smoking in early  80's    Clinical Intake:  Pre-visit preparation completed: Yes  Pain : No/denies pain  Diabetes: No  Lab Results  Component Value Date   HGBA1C 6.0 (H) 11/24/2020   HGBA1C 6.4 (H) 02/14/2020   HGBA1C (H) 03/11/2008    6.6 (NOTE)   The ADA recommends the following therapeutic goal for glycemic   control related to Hgb A1C measurement:   Goal of Therapy:   < 7.0% Hgb A1C   Reference: American Diabetes Association: Clinical Practice   Recommendations 2008, Diabetes Care,  2008, 31:(Suppl 1).     How often do you need to have someone help you when you read instructions, pamphlets, or other written materials from your doctor or pharmacy?: 1 - Never  Interpreter Needed?: No  Information entered by :: Charmaine Bloodgood LPN   Activities of Daily Living     01/27/2024    1:39 PM  In your present state of health, do you have any difficulty performing the following activities:  Hearing? 0  Vision? 0  Difficulty concentrating or making decisions? 1  Walking or climbing stairs? 1  Dressing or bathing? 0  Doing errands, shopping? 0  Preparing Food and eating ? N  Using the Toilet? N  In the past six months, have you accidently leaked urine? Y  Do you have problems with loss of bowel control? Y  Managing your Medications? N  Managing your Finances? N  Housekeeping or managing your Housekeeping? N    Patient Care Team: [name], MD as PCP - General (Family Medicine) Debera Jayson MATSU, MD as PCP - Cardiology (Cardiology) Clinic, [facility] Renda Gretel, MD as Consulting Physician (Urology)  I have updated your Care Teams any recent Medical Services you may have received from other providers in the past year.     Assessment:   This is a routine wellness examination for O[place].  Hearing/Vision screen Hearing Screening - Comments:: Denies hearing difficulties   Vision Screening - Comments:: Wears rx glasses - up to date with routine eye exams with provider at  [facility]    Goals Addressed  This Visit's Progress    Patient Stated   On track    I would like to maintain my current my health       Depression Screen     01/27/2024    3:01 PM 11/23/2023   11:38 AM 01/27/2023   11:08 AM 01/21/2023    3:42 PM 07/28/2022    8:56 AM 01/19/2022   10:04 AM 11/05/2021   10:34 AM  PHQ 2/9 Scores  PHQ - 2 Score 2 2 4 4 4 2  0  PHQ- 9 Score 11 11 18 16 19 13      Fall Risk     01/27/2024    1:39 PM 11/23/2023   11:38 AM 01/27/2023   11:08 AM 01/21/2023    3:49 PM 07/28/2022    8:56 AM  Fall Risk   Falls in the past year? 0 0 1 0 1  Number falls in past yr: 0  1 0 0  Injury with Fall? 0  0 0 0  Risk for fall due to : Impaired mobility   No Fall Risks;Impaired balance/gait;Impaired mobility Impaired balance/gait  Follow up Falls prevention discussed;Education provided;Falls evaluation completed   Falls prevention discussed;Education provided Falls evaluation completed    MEDICARE RISK AT HOME:  Medicare Risk at Home Any stairs in or around the home?: (Patient-Rptd) Yes If so, are there any without handrails?: No Home free of loose throw rugs in walkways, pet beds, electrical cords, etc?: (Patient-Rptd) Yes Adequate lighting in your home to reduce risk of falls?: (Patient-Rptd) Yes Life alert?: (Patient-Rptd) No Use of a cane, walker or w/c?: (Patient-Rptd) Yes Grab bars in the bathroom?: (Patient-Rptd) No Shower chair or bench in shower?: (Patient-Rptd) Yes Elevated toilet seat or a handicapped toilet?: (Patient-Rptd) No  TIMED UP AND GO:  Was the test performed?  No  Cognitive Function: 6CIT completed        01/27/2024    3:02 PM 01/21/2023    3:50 PM  6CIT Screen  What Year? 0 points 0 points  What month? 0 points 0 points  What time? 0 points 0 points  Count back from 20 0 points 0 points  Months in reverse 0 points 0 points  Repeat phrase 0 points 0 points  Total Score 0 points 0 points     Immunizations Immunization History  Administered Date(s) Administered   DT (Pediatric) 07/10/2013   Fluad Quad(high Dose 65+) 01/19/2022   Fluad Trivalent(High Dose 65+) 01/27/2023   INFLUENZA, HIGH DOSE SEASONAL PF 02/08/2016, 03/10/2018, 01/30/2019   Influenza, Seasonal, Injecte, Preservative Fre 02/18/2016   Influenza,inj,Quad PF,6+ Mos 02/01/2014, 03/05/2015, 03/09/2017   Influenza-Unspecified 03/10/2018, 01/30/2019, 01/09/2020, 02/01/2020, 01/08/2021   Moderna Sars-Covid-2 Vaccination 05/22/2019, 06/22/2019, 03/20/2020, 03/31/2020   Pneumococcal Conjugate-13 07/15/2014   Pneumococcal Polysaccharide-23 07/10/2013   Tdap 10/23/2018   Zoster Recombinant(Shingrix ) 01/01/2019, 03/27/2019    Screening Tests Health Maintenance  Topic Date Due   Influenza Vaccine  12/09/2023   COVID-19 Vaccine (5 - 2025-26 season) 01/09/2024   Medicare Annual Wellness (AWV)  01/26/2025   DTaP/Tdap/Td (3 - Td or Tdap) 10/22/2028   Pneumococcal Vaccine: 50+ Years  Completed   Zoster Vaccines- Shingrix   Completed   HPV VACCINES  Aged Out   Meningococcal B Vaccine  Aged Out   Colonoscopy  Discontinued    Health Maintenance Items Addressed: Information provided on vaccine recommendations   Additional Screening:  Vision Screening: Recommended annual ophthalmology exams for early detection of glaucoma and other disorders of the eye. Is the  patient up to date with their annual eye exam?  Yes  Who is the provider or what is the name of the office in which the patient attends annual eye exams? [facility]   Dental Screening: Recommended annual dental exams for proper oral hygiene  Community Resource Referral / Chronic Care Management: CRR required this visit?  No   CCM required this visit?  No   Plan:    I have personally reviewed and noted the following in the patient's chart:   Medical and social history Use of alcohol, tobacco or illicit drugs  Current medications and  supplements including opioid prescriptions. Patient is not currently taking opioid prescriptions. Functional ability and status Nutritional status Physical activity Advanced directives List of other physicians Hospitalizations, surgeries, and ER visits in previous 12 months Vitals Screenings to include cognitive, depression, and falls Referrals and appointments  In addition, I have reviewed and discussed with patient certain preventive protocols, quality metrics, and best practice recommendations. A written personalized care plan for preventive services as well as general preventive health recommendations were provided to patient.   Lavelle Pfeiffer [place], [place]   [id number]   After Visit Summary: (MyChart) Due to this being a telephonic visit, the after visit summary with patients personalized plan was offered to patient via MyChart   Notes: Nothing significant to report at this time.

## 2024-01-27 NOTE — Patient Instructions (Signed)
 [name],  Thank you for taking the time for your Medicare Wellness Visit. I appreciate your continued commitment to your health goals. Please review the care plan we discussed, and feel free to reach out if I can assist you further.  Medicare recommends these wellness visits once per year to help you and your care team stay ahead of potential health issues. These visits are designed to focus on prevention, allowing your provider to concentrate on managing your acute and chronic conditions during your regular appointments.  Please note that Annual Wellness Visits do not include a physical exam. Some assessments may be limited, especially if the visit was conducted virtually. If needed, we may recommend a separate in-person follow-up with your provider.  Ongoing Care Seeing your primary care provider every 3 to 6 months helps us  monitor your health and provide consistent, personalized care.   Referrals If a referral was made during today's visit and you haven't received any updates within two weeks, please contact the referred provider directly to check on the status.  Recommended Screenings:  Health Maintenance  Topic Date Due   Flu Shot  12/09/2023   COVID-19 Vaccine (5 - 2025-26 season) 01/09/2024   Medicare Annual Wellness Visit  01/26/2025   DTaP/Tdap/Td vaccine (3 - Td or Tdap) 10/22/2028   Pneumococcal Vaccine for age over 66  Completed   Zoster (Shingles) Vaccine  Completed   HPV Vaccine  Aged Out   Meningitis B Vaccine  Aged Out   Colon Cancer Screening  Discontinued       01/27/2024    3:02 PM  Advanced Directives  Does Patient Have a Medical Advance Directive? No  Would patient like information on creating a medical advance directive? Yes (MAU/Ambulatory/Procedural Areas - Information given)   Advance Care Planning is important because it: Ensures you receive medical care that aligns with your values, goals, and preferences. Provides guidance to your family and loved  ones, reducing the emotional burden of decision-making during critical moments.  Vision: Annual vision screenings are recommended for early detection of glaucoma, cataracts, and diabetic retinopathy. These exams can also reveal signs of chronic conditions such as diabetes and high blood pressure.  Dental: Annual dental screenings help detect early signs of oral cancer, gum disease, and other conditions linked to overall health, including heart disease and diabetes.  Please see the attached documents for additional preventive care recommendations.

## 2024-02-11 DIAGNOSIS — Z23 Encounter for immunization: Secondary | ICD-10-CM | POA: Diagnosis not present

## 2024-02-22 ENCOUNTER — Ambulatory Visit: Payer: Self-pay

## 2024-02-22 NOTE — Telephone Encounter (Signed)
 Noted

## 2024-02-22 NOTE — Telephone Encounter (Signed)
 FYI Only or Action Required?: FYI only for provider.  Patient was last seen in primary care on 11/23/2023 by Lance Glendia LABOR, MD.  Called Nurse Triage reporting Cramps.  Symptoms began about a month ago.  Interventions attempted: OTC medications: Hylands leg cramps and Ice/heat application.  Symptoms are: gradually worsening.  Triage Disposition: See PCP Within 2 Weeks  Patient/caregiver understands and will follow disposition?: Yes  Copied from CRM #[id number]. Topic: Clinical - Red Word Triage >> Feb 22, 2024 10:27 AM Hadassah PARAS wrote: Red Word that prompted transfer to Nurse Triage: Worsening cramps on lower leg, neck, hands   ----------------------------------------------------------------------- From previous Reason for Contact - Scheduling: Patient/patient representative is calling to schedule an appointment. Refer to attachments for appointment information. Reason for Disposition  Leg pain or muscle cramp is a chronic symptom (recurrent or ongoing AND present > 4 weeks)  Answer Assessment - Initial Assessment Questions 1. ONSET: When did the pain start?       01/09/24 after getting a prednisone  shot for chronic generalized rash pt has had for past 12 years. First time taking prednisone  shot, got it in right hip. Pain primarily occurs at night.  2. LOCATION: Where is the pain located?      Both legs, calves and feet primarily  3. PAIN: How bad is the pain?    (Scale 1-10; or mild, moderate, severe)     10/10 aching at night. Resolves by morning. Heating pad helpful.   4. WORK OR EXERCISE: Has there been any recent work or exercise that involved this part of the body?      No  5. CAUSE: What do you think is causing the leg pain?     Unsure  6. OTHER SYMPTOMS: Do you have any other symptoms? (e.g., chest pain, back pain, breathing difficulty, swelling, rash, fever, numbness, weakness)    No new numbness, reports chronic stable neuropathy. Noticed mild swelling in  feet and ankles about a week ago. Swelling gone now. Mild cramping pain in left side of neck up to head behind ear during the day. Cramping pain ribcage and in fingers. Denies CP. No increased SOB from baseline. Pt has COPD. Pt reports trying to take magnesium but unable to swallow tab due to having narrow closed off esophagus that needs dilated.  Protocols used: Leg Pain-A-AH

## 2024-02-23 ENCOUNTER — Ambulatory Visit (INDEPENDENT_AMBULATORY_CARE_PROVIDER_SITE_OTHER): Payer: Self-pay | Admitting: Family Medicine

## 2024-02-23 VITALS — BP 123/57 | HR 69 | Ht 67.0 in | Wt 136.4 lb

## 2024-02-23 DIAGNOSIS — R252 Cramp and spasm: Secondary | ICD-10-CM | POA: Diagnosis not present

## 2024-02-23 MED ORDER — BACLOFEN 10 MG PO TABS
5.0000 mg | ORAL_TABLET | Freq: Every evening | ORAL | 0 refills | Status: DC | PRN
Start: 2024-02-23 — End: 2024-03-07

## 2024-02-23 NOTE — Assessment & Plan Note (Signed)
 Labs today.  Baclofen at night as needed.  He is to follow-up with his PCP.  Advise gentle stretching.

## 2024-02-23 NOTE — Progress Notes (Signed)
 Subjective:  Patient ID: [name], male    DOB: [birth year]  Age: 83 y.o. MRN: [id number]  CC: Muscle cramps   HPI:  83 year old male presents with complaints of muscle cramps.  Muscle cramps in the hands, feet, legs.  He even reports cramping in the ribs.  He states that his symptoms are worse at night.  He has previously seen his PCP for this in July.  He states that he has been taking an over-the-counter treatment which helps some but does not resolve his problem.  He has chronic hyponatremia.  He takes salt tablets for this.   Patient Active Problem List   Diagnosis Date Noted   Muscle cramping 02/23/2024   Acute hyponatremia 01/21/2023   Generalized weakness 01/21/2023   Anxiety 01/21/2023   Diabetes insipidus 07/30/2020   Coronary artery disease involving native coronary artery of native heart with angina pectoris 07/30/2020   Prediabetes 02/16/2020   Peripheral arterial disease 09/11/2019   COPD GOLD II  03/14/2019   DOE (dyspnea on exertion) 03/14/2019   Senile purpura 10/23/2018   History of prostate cancer 12/17/2013   Hypothyroidism 11/27/2012   Constipation 07/01/2008   History of colonic polyps 07/01/2008   PITUITARY MACROADENOMA 06/27/2008   HYPERCHOLESTEROLEMIA 06/27/2008   Essential hypertension 06/27/2008   GERD 06/27/2008    Social Hx   Social History   Socioeconomic History   Marital status: Married    Spouse name: Not on file   Number of children: 2   Years of education: Not on file   Highest education level: Not on file  Occupational History   Occupation: Retired  Tobacco Use   Smoking status: Former    Types: Cigarettes   Smokeless tobacco: Never   Tobacco comments:    quit smoking in early 80's  Vaping Use   Vaping status: Never Used  Substance and Sexual Activity   Alcohol use: Not Currently    Alcohol/week: 0.0 standard drinks of alcohol   Drug use: Not Currently   Sexual activity: Not on file  Other Topics Concern   Not  on file  Social History Narrative   Married with 2 children   Right handed   12+   2-3 cups daily   Social Drivers of Health   Financial Resource Strain: Low Risk  (01/27/2024)   Overall Financial Resource Strain (CARDIA)    Difficulty of Paying Living Expenses: Not hard at all  Food Insecurity: No Food Insecurity (01/27/2024)   Hunger Vital Sign    Worried About Running Out of Food in the Last Year: Never true    Ran Out of Food in the Last Year: Never true  Transportation Needs: No Transportation Needs (01/27/2024)   PRAPARE - Administrator, Civil Service (Medical): No    Lack of Transportation (Non-Medical): No  Physical Activity: Inactive (01/27/2024)   Exercise Vital Sign    Days of Exercise per Week: 0 days    Minutes of Exercise per Session: 0 min  Stress: Stress Concern Present (01/27/2024)   [employer] of Occupational Health - Occupational Stress Questionnaire    Feeling of Stress: To some extent  Social Connections: Moderately Isolated (01/27/2024)   Social Connection and Isolation Panel    Frequency of Communication with Friends and Family: Once a week    Frequency of Social Gatherings with Friends and Family: Once a week    Attends Religious Services: More than 4 times per year    Active Member of  Clubs or Organizations: No    Attends Banker Meetings: Never    Marital Status: Married    Review of Systems Per HPI  Objective:  BP (!) 123/57   Pulse 69   Ht 5' 7 (1.702 m)   Wt 136 lb 6 oz (61.9 kg)   BMI 21.36 kg/m      02/23/2024   10:49 AM 01/27/2024    2:28 PM 11/23/2023   11:33 AM  BP/Weight  Systolic BP 123 -- 135  Diastolic BP 57 -- 56  Wt. (Lbs) 136.38 144 144.4  BMI 21.36 kg/m2 22.55 kg/m2 22.62 kg/m2    Physical Exam Vitals and nursing note reviewed.  Constitutional:      General: He is not in acute distress.    Appearance: Normal appearance.  HENT:     Head: Normocephalic and atraumatic.  Cardiovascular:      Rate and Rhythm: Normal rate and regular rhythm.  Pulmonary:     Effort: Pulmonary effort is normal.     Breath sounds: Normal breath sounds.  Neurological:     Mental Status: He is alert.  Psychiatric:     Comments: Flat affect.     Lab Results  Component Value Date   WBC 7.3 11/24/2023   HGB 14.5 11/24/2023   HCT 44.5 11/24/2023   PLT 128 (L) 11/24/2023   GLUCOSE 78 11/24/2023   CHOL 147 05/17/2022   TRIG 68 05/17/2022   HDL 46 05/17/2022   LDLCALC 87 05/17/2022   ALT 14 01/22/2023   AST 18 01/22/2023   NA 128 (L) 11/24/2023   K 5.1 11/24/2023   CL 92 (L) 11/24/2023   CREATININE 0.89 11/24/2023   BUN 12 11/24/2023   CO2 21 11/24/2023   TSH 4.240 01/22/2023   PSA 6.76 (H) 02/03/2009   INR 0.98 10/04/2017   HGBA1C 6.0 (H) 11/24/2020     Assessment & Plan:  Muscle cramping Assessment & Plan: Labs today.  Baclofen at night as needed.  He is to follow-up with his PCP.  Advise gentle stretching.  Orders: -     Basic metabolic panel with GFR -     Magnesium -     Baclofen; Take 0.5 tablets (5 mg total) by mouth at bedtime as needed for muscle spasms.  Dispense: 20 each; Refill: 0    Follow-up:  Return in about 1 week (around 03/01/2024).  Jacqulyn Ahle DO [facility]

## 2024-02-23 NOTE — Patient Instructions (Signed)
 Labs today.  Follow up with Dr. Glendia in ~ 1 week.  Take care  Dr. Bluford

## 2024-02-24 LAB — BASIC METABOLIC PANEL WITH GFR
BUN/Creatinine Ratio: 19 (ref 10–24)
BUN: 17 mg/dL (ref 8–27)
CO2: 25 mmol/L (ref 20–29)
Calcium: 9.3 mg/dL (ref 8.6–10.2)
Chloride: 94 mmol/L — ABNORMAL LOW (ref 96–106)
Creatinine, Ser: 0.9 mg/dL (ref 0.76–1.27)
Glucose: 90 mg/dL (ref 70–99)
Potassium: 4.7 mmol/L (ref 3.5–5.2)
Sodium: 134 mmol/L (ref 134–144)
eGFR: 86 mL/min/1.73 (ref >59)

## 2024-02-24 LAB — MAGNESIUM: Magnesium: 1.9 mg/dL (ref 1.6–2.3)

## 2024-02-26 ENCOUNTER — Ambulatory Visit: Payer: Self-pay | Admitting: Family Medicine

## 2024-03-07 ENCOUNTER — Encounter: Payer: Self-pay | Admitting: Family Medicine

## 2024-03-07 ENCOUNTER — Ambulatory Visit (INDEPENDENT_AMBULATORY_CARE_PROVIDER_SITE_OTHER): Payer: Self-pay | Admitting: Family Medicine

## 2024-03-07 VITALS — BP 146/78 | HR 68 | Temp 97.3°F | Ht 67.0 in | Wt 135.1 lb

## 2024-03-07 DIAGNOSIS — M720 Palmar fascial fibromatosis [Dupuytren]: Secondary | ICD-10-CM | POA: Diagnosis not present

## 2024-03-07 DIAGNOSIS — R252 Cramp and spasm: Secondary | ICD-10-CM | POA: Diagnosis not present

## 2024-03-07 MED ORDER — AMLODIPINE BESYLATE 2.5 MG PO TABS: 2.5000 mg | ORAL_TABLET | Freq: Every day | ORAL | 3 refills | Status: AC

## 2024-03-07 NOTE — Progress Notes (Signed)
   Subjective:    Patient ID: [name], male    DOB: 01/22/1941, 83 y.o.   MRN: [id number]  HPI Pt complains of muscle cramps that had went away but states they are now coming back. States could not take his medicine he was given for cramps due to it making him shake like a leaf. He relates that the muscle relaxer medicine caused too many side effects He states that the cramps are doing better but they still hit him at nighttime more so in the lower legs He denies any hand cramping Denies blood pressure issues   Review of Systems     Objective:   Physical Exam General-in no acute distress Eyes-no discharge Lungs-respiratory rate normal, CTA CV-no murmurs,RRR Extremities skin warm dry no edema Neuro grossly normal Behavior normal, alert Dupuytren's contractures noted in both hands       Assessment & Plan:  Dupuytren's contractures-referral to hand specialist  Blood pressure borderline elevated along with muscle cramps low-dose calcium channel blocker would be reasonable amlodipine  2.5 mg In the past he had side effects to amlodipine  I told him if he does have side effects again stop the medicine otherwise come back in 2 to 3 weeks to see how things are going  Certainly could consider gabapentin  if any ongoing symptoms.

## 2024-03-19 DIAGNOSIS — E1165 Type 2 diabetes mellitus with hyperglycemia: Secondary | ICD-10-CM | POA: Diagnosis not present

## 2024-03-19 DIAGNOSIS — D443 Neoplasm of uncertain behavior of pituitary gland: Secondary | ICD-10-CM | POA: Diagnosis not present

## 2024-03-19 DIAGNOSIS — R0600 Dyspnea, unspecified: Secondary | ICD-10-CM | POA: Diagnosis not present

## 2024-03-19 DIAGNOSIS — E032 Hypothyroidism due to medicaments and other exogenous substances: Secondary | ICD-10-CM | POA: Diagnosis not present

## 2024-03-19 DIAGNOSIS — E232 Diabetes insipidus: Secondary | ICD-10-CM | POA: Diagnosis not present

## 2024-03-20 DIAGNOSIS — L820 Inflamed seborrheic keratosis: Secondary | ICD-10-CM | POA: Diagnosis not present

## 2024-03-20 DIAGNOSIS — X32XXXD Exposure to sunlight, subsequent encounter: Secondary | ICD-10-CM | POA: Diagnosis not present

## 2024-03-20 DIAGNOSIS — L570 Actinic keratosis: Secondary | ICD-10-CM | POA: Diagnosis not present

## 2024-03-21 ENCOUNTER — Ambulatory Visit: Admitting: Family Medicine

## 2024-03-21 VITALS — BP 119/52 | HR 65 | Ht 67.0 in | Wt 136.1 lb

## 2024-03-21 DIAGNOSIS — I10 Essential (primary) hypertension: Secondary | ICD-10-CM | POA: Diagnosis not present

## 2024-03-21 DIAGNOSIS — T148XXA Other injury of unspecified body region, initial encounter: Secondary | ICD-10-CM

## 2024-03-21 DIAGNOSIS — S80811A Abrasion, right lower leg, initial encounter: Secondary | ICD-10-CM

## 2024-03-21 DIAGNOSIS — L03115 Cellulitis of right lower limb: Secondary | ICD-10-CM | POA: Diagnosis not present

## 2024-03-21 MED ORDER — CEPHALEXIN 500 MG PO CAPS: 500.0000 mg | ORAL_CAPSULE | Freq: Three times a day (TID) | ORAL | 0 refills | Status: AC

## 2024-03-21 MED ORDER — IPRATROPIUM BROMIDE 0.06 % NA SOLN: 2.0000 | Freq: Four times a day (QID) | NASAL | 12 refills | Status: AC

## 2024-03-21 NOTE — Progress Notes (Signed)
   Subjective:    Patient ID: [name], male    DOB: [birth year], 83 y.o.   MRN: [id number]  HPI Here today for follow-up on blood pressure Tolerates medicine well Staying active for his age Had a recent fall when he lost balance Suffered skin tears has redness and soreness around it   Review of Systems     Objective:   Physical Exam General-in no acute distress Eyes-no discharge Lungs-respiratory rate normal, CTA CV-no murmurs,RRR Extremities skin warm dry no edema Neuro grossly normal Behavior normal, alert Skin tears are noted on right leg along with some early cellulitis around them       Assessment & Plan:  Lightest-Keflex  3 times daily for 7 days should take care of the problem Skin tears will take 6 to 8 weeks to heal up Blood pressure reasonable Will send request to his endocrinologist for recent lab work as well as chest x-ray results-front office sending this today

## 2024-05-09 ENCOUNTER — Other Ambulatory Visit (HOSPITAL_COMMUNITY): Payer: Self-pay | Admitting: Internal Medicine

## 2024-05-09 DIAGNOSIS — R17 Unspecified jaundice: Secondary | ICD-10-CM

## 2024-05-15 ENCOUNTER — Ambulatory Visit (HOSPITAL_COMMUNITY)
Admission: RE | Admit: 2024-05-15 | Discharge: 2024-05-15 | Disposition: A | Discharge status: Home / Self Care | Source: Ambulatory Visit | Attending: Internal Medicine | Admitting: Internal Medicine

## 2024-05-15 DIAGNOSIS — R17 Unspecified jaundice: Secondary | ICD-10-CM | POA: Diagnosis present

## 2024-05-28 ENCOUNTER — Ambulatory Visit: Attending: Physician Assistant | Admitting: Physician Assistant

## 2024-05-28 ENCOUNTER — Encounter: Payer: Self-pay | Admitting: Physician Assistant

## 2024-05-28 ENCOUNTER — Telehealth: Payer: Self-pay | Admitting: Cardiology

## 2024-05-28 VITALS — BP 120/60 | HR 76 | Ht 67.0 in | Wt 141.6 lb

## 2024-05-28 DIAGNOSIS — I739 Peripheral vascular disease, unspecified: Secondary | ICD-10-CM | POA: Insufficient documentation

## 2024-05-28 DIAGNOSIS — E785 Hyperlipidemia, unspecified: Secondary | ICD-10-CM | POA: Diagnosis not present

## 2024-05-28 DIAGNOSIS — R600 Localized edema: Secondary | ICD-10-CM | POA: Diagnosis not present

## 2024-05-28 DIAGNOSIS — R0602 Shortness of breath: Secondary | ICD-10-CM | POA: Insufficient documentation

## 2024-05-28 DIAGNOSIS — I25119 Atherosclerotic heart disease of native coronary artery with unspecified angina pectoris: Secondary | ICD-10-CM | POA: Diagnosis not present

## 2024-05-28 DIAGNOSIS — G458 Other transient cerebral ischemic attacks and related syndromes: Secondary | ICD-10-CM | POA: Insufficient documentation

## 2024-05-28 DIAGNOSIS — I6523 Occlusion and stenosis of bilateral carotid arteries: Secondary | ICD-10-CM | POA: Insufficient documentation

## 2024-05-28 NOTE — Patient Instructions (Signed)
 Medication Instructions:  Your physician recommends that you continue on your current medications as directed. Please refer to the Current Medication list given to you today.  *If you need a refill on your cardiac medications before your next appointment, please call your pharmacy*  Lab Work: Your physician recommends that you return for lab work in: Today   If you have labs (blood work) drawn today and your tests are completely normal, you will receive your results only by: MyChart Message (if you have MyChart) OR A paper copy in the mail If you have any lab test that is abnormal or we need to change your treatment, we will call you to review the results.  Testing/Procedures: Your physician has requested that you have an echocardiogram. Echocardiography is a painless test that uses sound waves to create images of your heart. It provides your doctor with information about the size and shape of your heart and how well your hearts chambers and valves are working. This procedure takes approximately one hour. There are no restrictions for this procedure. Please do NOT wear cologne, perfume, aftershave, or lotions (deodorant is allowed). Please arrive 15 minutes prior to your appointment time.  Please note: We ask at that you not bring children with you during ultrasound (echo/ vascular) testing. Due to room size and safety concerns, children are not allowed in the ultrasound rooms during exams. Our front office staff cannot provide observation of children in our lobby area while testing is being conducted. An adult accompanying a patient to their appointment will only be allowed in the ultrasound room at the discretion of the ultrasound technician under special circumstances. We apologize for any inconvenience.  Your physician has requested that you have a lower or upper extremity arterial duplex. This test is an ultrasound of the arteries in the legs or arms. It looks at arterial blood flow in the  legs and arms. Allow one hour for Lower and Upper Arterial scans. There are no restrictions or special instructions.  Please note: We ask at that you not bring children with you during ultrasound (echo/ vascular) testing. Due to room size and safety concerns, children are not allowed in the ultrasound rooms during exams. Our front office staff cannot provide observation of children in our lobby area while testing is being conducted. An adult accompanying a patient to their appointment will only be allowed in the ultrasound room at the discretion of the ultrasound technician under special circumstances. We apologize for any inconvenience.   Follow-Up: At [facility], you and your health needs are our priority.  As part of our continuing mission to provide you with exceptional heart care, our providers are all part of one team.  This team includes your primary Cardiologist (physician) and Advanced Practice Providers or APPs (Physician Assistants and Nurse Practitioners) who all work together to provide you with the care you need, when you need it.  Your next appointment:   2 month(s)  Provider:   Jayson Sierras, MD or Lorette Kapur, PA-C     We recommend signing up for the patient portal called MyChart.  Sign up information is provided on this After Visit Summary.  MyChart is used to connect with patients for Virtual Visits (Telemedicine).  Patients are able to view lab/test results, encounter notes, upcoming appointments, etc.  Non-urgent messages can be sent to your provider as well.   To learn more about what you can do with MyChart, go to [email or web address].   Other Instructions Thank you for  choosing [facility]!

## 2024-05-28 NOTE — Telephone Encounter (Signed)
 Pt c/o swelling/edema: STAT if pt has developed SOB within 24 hours  If swelling, where is the swelling located? Feet and legs   How much weight have you gained and in what time span? No   Have you gained 2 pounds in a day or 5 pounds in a week? No   Do you have a log of your daily weights (if so, list)? Did not take   Are you currently taking a fluid pill? No   Are you currently SOB? No   Have you traveled recently in a car or plane for an extended period of time? No

## 2024-05-28 NOTE — Telephone Encounter (Signed)
 Spoke to pt who c/o swelling in his feet/ankles for the last 2-3 months. Pt stated that swelling was worse in his right leg. Pt stated swelling was a little better today. Pt stated weight today was 143 lb, at last ov in 05/2023 weight was 137 lb. Pt stated that he does not have a daily log of weights. Pt denied SOB/CP.   Appt made with CANDIE Kapur, PA for today in the [place] office.   Please advise

## 2024-05-28 NOTE — Progress Notes (Signed)
 " Cardiology Office Note:  .   Date:  05/28/2024  ID:  [name], DOB [birth year], MRN [id number] PCP: [name], MD  [facility] Providers Cardiologist:  Jayson Sierras, MD {  History of Present Illness: .   [name] is a 84 y.o. male  with PMHx of CAD, PAD, Carotid Stenosis, HLD who reports to [place] office for follow up.   Pertinent cardiac medical history:  Cath 08/2018: nonobstructive CAD along the LAD and LCx with 80% stenosis along a small nondominant RCA with medical management recommended  Echo in 2021 revealed LVEF 55 to 60% without  RWMA.  Carotid Dopplers in 2022 : <  50% bilateral ICA stenosis.  Carotid Doppler in 08/2023: <  50% bilateral ICA stenosis.  US  ABI 08/2023: Borderline left LE PAD.  Noted significant difference in brachial pressures with left measuring 147 mmHg versus right 163 mmHg.  Last seen in heartcare 05/31/2023 by Dr. Sierras for follow up.  Noted possible PAD diagnosis at [facility], although his symptoms sound more neuropathic.  Reported intermittent leg pain with right worse than left.  Otherwise doing well from cardiac standpoint.  Continued on ASA 81 mg, Imdur  30 mg twice daily, Lopressor  12.5 mg twice daily, NTG as needed, pravastatin  20 mg daily. Follow up carotid US  and US  ABI as above.  ABI report recommended repeat bilateral upper extremity BP measurements and if consistent then indicative of left subclavian or brachial artery stenosis. Per Dr. Sierras, recheck both arms BPs at next office visit.  Today, he reports his primary concern is intermittent swelling in the legs and feet over the past several months. Swelling is typically worse at the end of the day and often improves by the following day, however seems to be getting worse. He is unable to tolerate compression stockings. He also endorses baseline shortness of breath related to COPD, which at times seems slightly worse. He reports intermittent left arm pain lasting up  to one day and notes lightheadedness when turning his head upward. Diet is notable for frequent processed snacks and soda intake. Despite this, he remains fairly active and is able to help with physically demanding tasks such as building a shed and carrying heavy equipment.  He denies palpitations, syncope, presyncope, orthopnea, PND, significant weight changes, acute bleeding, or claudication. He reports medication compliance but states he has not taken aspirin  for approximately 6 months due to bruising. He also reports taking desmopressin  3-4 times daily for urinary frequency and would prefer reducing desmopressin  rather than initiating a diuretic if volume management becomes necessary.  ROS: 10 point review of system has been reviewed and considered negative except ones been listed in the HPI.   Studies Reviewed: SABRA   LHC 2020 Mid RCA lesion is 30% stenosed. Prox RCA to Mid RCA lesion is 80% stenosed. Ost 1st Mrg to 1st Mrg lesion is 30% stenosed. Ost 1st Diag to 1st Diag lesion is 20% stenosed. Prox LAD lesion is 20% stenosed. The left ventricular ejection fraction is 50-55% by visual estimate. The left ventricular systolic function is normal. LV end diastolic pressure is mildly elevated.   Low normal LV function with ejection fraction estimated at 50 to 55%.  LVEDP 18 mm.   Non-obstructive CAD involving the LAD, diagonal, and dominant left circumflex coronary artery with 20 and 30% narrowings.   Nondominant RCA with shepherd's crook takeoff and 80% stenosis on a bend in the in the proximal to mid vessel and 30% mid  stenosis.   RECOMMENDATION: Since the patient was not on any anti-ischemic medication nor lipid-lowering therapy, since his RCA is a nondominant vessel and small caliber recommend an initial attempt at aggressive medical therapy for his CAD.  If medical therapy fails, then consider PCI to his 80% RCA lesion.  At present, will add low-dose nitrates, beta-blocker therapy, and  with his reported statin intolerance, initiate Zetia .  However patient should be considered for PCSK9 inhibition or consider rechallenge with low-dose statin.  Also consider possible addition of amlodipine  for anti-ischemic medical therapy in addition to hypertension control with ideal blood pressure less than 120/80 and LDL < 70.  Diagnostic Dominance: Left   Limited ECHO 2021 IMPRESSIONS   1. Left ventricular ejection fraction, by estimation, is 55 to 60%. The  left ventricle has normal function. The left ventricle has no regional  wall motion abnormalities. Left ventricular diastolic parameters were  normal.   2. Right ventricular systolic function is normal. The right ventricular  size is normal.   3. The mitral valve is grossly normal. Trivial mitral valve  regurgitation.   4. The aortic valve is tricuspid. Aortic valve regurgitation is mild. No  aortic stenosis is present.   5. The inferior vena cava is normal in size with <50% respiratory  variability, suggesting right atrial pressure of 8 mmHg.    US  Arterial ABI 08/2023 IMPRESSION: 1. Borderline left lower extremity peripheral arterial disease. 2. Normal examination of the right lower extremity. 3. Significant difference in brachial pressures with left measuring 147 mm Hg compared to right measuring 163 mm Hg. Recommend repeat bilateral upper extremity blood pressure measurements. If the difference persists, this would be indicative of left subclavian or brachial artery stenosis.  Carotid US  08/2023 IMPRESSION: 1. No flow identified within the right vertebral artery, which is new compared to prior examinations. Consider further evaluation with CT angiography of the neck evaluate for vertebral artery occlusion. 2. Mild calcified plaque of the ICA origins bilaterally with Doppler measurements most consistent with less than 50% stenosis.  CV Studies: Cardiac studies reviewed are outlined and summarized above. Otherwise  please see EMR for full report.   Physical Exam:   VS:  BP 120/60 (BP Location: Right Arm, Patient Position: Sitting, Cuff Size: Normal)   Pulse 76   Ht 5' 7 (1.702 m)   Wt 141 lb 9.6 oz (64.2 kg)   SpO2 96%   BMI 22.18 kg/m    Wt Readings from Last 3 Encounters:  05/28/24 141 lb 9.6 oz (64.2 kg)  03/21/24 136 lb 1.9 oz (61.7 kg)  03/07/24 135 lb 1.9 oz (61.3 kg)    GEN: Well nourished, well developed in no acute distress while sitting in chair.  NECK: No JVD; No carotid bruits CARDIAC: RRR, no murmurs, rubs, gallops RESPIRATORY:  wheezing throughout lungs.  ABDOMEN: Soft, non-tender, non-distended EXTREMITIES:  trace edema bilaterally; No deformity   ASSESSMENT AND PLAN: .   Lower extremity edema Echo 2021 normal as above with EF 55-60%.  Intermittent, dependent edema worse at end of day with improvement overnight, which has been getting worse. Trace bilateral edema noted on exam. Multifactorial considerations including venous insufficiency, dietary sodium intake, medication effects, and cardiac contribution. Order BNP, CBC, CMP, magnesium, ECHO.  Based on lab results, will reassess need for diuretic therapy; patient prefers trial of reducing desmopressin  dose to allow increased urine output if feasible. If no significant concerns on ECHO, then will consider venous LE reflux study.  Counsel on low-sodium diet.  SOB (shortness of breath) Baseline dyspnea related to COPD with possible mild worsening. Given edema and symptoms, will order echo to reassess LV function. Continue pulmonary management per PCP/Pulmonology.  Peripheral arterial disease Borderline left lower-extremity PAD on ABI. No clear exertional claudication at this time. Continue conservative management and risk-factor modification. Aspirin  previously stopped due to bruising; risks/benefits discussed.  Bilateral carotid artery stenosis Mild (<50%) bilateral ICA disease, stable. No bruits.  Repeat Carotid US  in  08/2024. Will order in next follow up visit.   Subclavian steal syndrome Persistent difference noted on ABI testing and in office BP w/ Right BP of 120/60 and Left BP of 136/64.  Symptoms of intermittent left arm pain and positional lightheadedness raise concern for subclavian or brachial artery disease. Order bilateral upper-extremity arterial duplex US  to evaluate for subclavian or brachiocephalic artery stenosis.  Coronary artery disease involving native coronary artery of native heart with angina pectoris Hyperlipidemia LDL goal <70 Stable, medically managed. Denies anginal symptoms. Continue metoprolol  tartrate 12.5 mg BID, isosorbide  mononitrate 30 mg BID, nitroglycerin  SL prn, pravastatin  20 mg daily. Aspirin  previously stopped due to bruising; risks/benefits discussed.  Dispo: Follow up in 2 months.   Signed, Lorette CINDERELLA Kapur, PA-C  "

## 2024-05-30 ENCOUNTER — Ambulatory Visit: Payer: Self-pay | Admitting: Physician Assistant

## 2024-05-30 LAB — CBC
Hematocrit: 46.5 % (ref 37.5–51.0)
Hemoglobin: 15 g/dL (ref 13.0–17.7)
MCH: 30.3 pg (ref 26.6–33.0)
MCHC: 32.3 g/dL (ref 31.5–35.7)
MCV: 94 fL (ref 79–97)
Platelets: 136 x10E3/uL — ABNORMAL LOW (ref 150–450)
RBC: 4.95 x10E6/uL (ref 4.14–5.80)
RDW: 12.7 % (ref 11.6–15.4)
WBC: 6.6 x10E3/uL (ref 3.4–10.8)

## 2024-05-30 LAB — COMPREHENSIVE METABOLIC PANEL WITH GFR
ALT: 11 IU/L (ref 0–44)
AST: 20 IU/L (ref 0–40)
Albumin: 4.1 g/dL (ref 3.7–4.7)
Alkaline Phosphatase: 79 IU/L (ref 48–129)
BUN/Creatinine Ratio: 12 (ref 10–24)
BUN: 12 mg/dL (ref 8–27)
Bilirubin Total: 1.6 mg/dL — ABNORMAL HIGH (ref 0.0–1.2)
CO2: 23 mmol/L (ref 20–29)
Calcium: 9.3 mg/dL (ref 8.6–10.2)
Chloride: 102 mmol/L (ref 96–106)
Creatinine, Ser: 1.03 mg/dL (ref 0.76–1.27)
Globulin, Total: 2.4 g/dL (ref 1.5–4.5)
Glucose: 100 mg/dL — ABNORMAL HIGH (ref 70–99)
Potassium: 4.9 mmol/L (ref 3.5–5.2)
Sodium: 140 mmol/L (ref 134–144)
Total Protein: 6.5 g/dL (ref 6.0–8.5)
eGFR: 73 mL/min/1.73

## 2024-05-30 LAB — MAGNESIUM: Magnesium: 1.9 mg/dL (ref 1.6–2.3)

## 2024-05-30 LAB — PRO B NATRIURETIC PEPTIDE: NT-Pro BNP: 634 pg/mL — ABNORMAL HIGH (ref 0–486)

## 2024-06-07 ENCOUNTER — Telehealth: Payer: Self-pay | Admitting: Cardiology

## 2024-06-07 NOTE — Telephone Encounter (Signed)
 Velma Birmingham, PA w/ the [facility] called in stating he is considering starting this pt on a new medication. He was able to see the notes from pt's appt here on 05/28/24 but he wasn't able to see EKG. He asked if someone from clinical staff can call to give him some of the numbers from the EKG. Please advise.

## 2024-06-07 NOTE — Telephone Encounter (Signed)
 Spoke with Velma Birmingham, PA all questions answered.

## 2024-06-11 ENCOUNTER — Ambulatory Visit (HOSPITAL_COMMUNITY)
Admission: RE | Admit: 2024-06-11 | Discharge: 2024-06-11 | Disposition: A | Discharge status: Home / Self Care | Source: Ambulatory Visit | Attending: Physician Assistant | Admitting: Physician Assistant

## 2024-06-11 DIAGNOSIS — G458 Other transient cerebral ischemic attacks and related syndromes: Secondary | ICD-10-CM

## 2024-07-04 ENCOUNTER — Other Ambulatory Visit (HOSPITAL_COMMUNITY)

## 2024-08-01 ENCOUNTER — Ambulatory Visit: Admitting: Physician Assistant

## 2024-08-14 ENCOUNTER — Ambulatory Visit: Admitting: Cardiology

## 2024-09-06 ENCOUNTER — Ambulatory Visit: Admitting: Family Medicine
